# Patient Record
Sex: Male | Born: 1955 | Race: White | Hispanic: No | Marital: Married | Smoking: Former smoker
Health system: Southern US, Community
[De-identification: ages and names within clinical notes are randomized; demographics above are authoritative.]

## PROBLEM LIST (undated history)

## (undated) DIAGNOSIS — F419 Anxiety disorder, unspecified: Secondary | ICD-10-CM

## (undated) DIAGNOSIS — G8929 Other chronic pain: Secondary | ICD-10-CM

## (undated) DIAGNOSIS — K3580 Unspecified acute appendicitis: Secondary | ICD-10-CM

## (undated) DIAGNOSIS — K219 Gastro-esophageal reflux disease without esophagitis: Secondary | ICD-10-CM

## (undated) DIAGNOSIS — J189 Pneumonia, unspecified organism: Secondary | ICD-10-CM

## (undated) DIAGNOSIS — F329 Major depressive disorder, single episode, unspecified: Secondary | ICD-10-CM

## (undated) DIAGNOSIS — E785 Hyperlipidemia, unspecified: Secondary | ICD-10-CM

## (undated) DIAGNOSIS — M1990 Unspecified osteoarthritis, unspecified site: Secondary | ICD-10-CM

## (undated) DIAGNOSIS — I10 Essential (primary) hypertension: Secondary | ICD-10-CM

## (undated) DIAGNOSIS — F32A Depression, unspecified: Secondary | ICD-10-CM

## (undated) HISTORY — DX: Gastro-esophageal reflux disease without esophagitis: K21.9

## (undated) HISTORY — DX: Anxiety disorder, unspecified: F41.9

## (undated) HISTORY — PX: APPENDECTOMY: SHX54

## (undated) HISTORY — DX: Pneumonia, unspecified organism: J18.9

## (undated) HISTORY — DX: Depression, unspecified: F32.A

## (undated) HISTORY — DX: Unspecified osteoarthritis, unspecified site: M19.90

---
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
## 2004-12-05 ENCOUNTER — Encounter: Admission: RE | Admit: 2004-12-05 | Discharge: 2004-12-05 | Payer: Self-pay | Admitting: Family Medicine

## 2007-04-21 ENCOUNTER — Ambulatory Visit: Payer: Self-pay | Admitting: Internal Medicine

## 2007-04-21 DIAGNOSIS — F411 Generalized anxiety disorder: Secondary | ICD-10-CM | POA: Insufficient documentation

## 2007-04-21 DIAGNOSIS — Z8739 Personal history of other diseases of the musculoskeletal system and connective tissue: Secondary | ICD-10-CM | POA: Insufficient documentation

## 2007-07-14 ENCOUNTER — Encounter (INDEPENDENT_AMBULATORY_CARE_PROVIDER_SITE_OTHER): Payer: Self-pay | Admitting: Family Medicine

## 2007-07-14 ENCOUNTER — Ambulatory Visit: Payer: Self-pay | Admitting: Family Medicine

## 2007-07-14 DIAGNOSIS — F17200 Nicotine dependence, unspecified, uncomplicated: Secondary | ICD-10-CM

## 2007-07-15 ENCOUNTER — Telehealth (INDEPENDENT_AMBULATORY_CARE_PROVIDER_SITE_OTHER): Payer: Self-pay | Admitting: *Deleted

## 2007-07-21 ENCOUNTER — Telehealth (INDEPENDENT_AMBULATORY_CARE_PROVIDER_SITE_OTHER): Payer: Self-pay | Admitting: *Deleted

## 2007-07-21 ENCOUNTER — Encounter (INDEPENDENT_AMBULATORY_CARE_PROVIDER_SITE_OTHER): Payer: Self-pay | Admitting: *Deleted

## 2007-07-21 LAB — CONVERTED CEMR LAB
Cholesterol: 238 mg/dL (ref 0–200)
Direct LDL: 162.8 mg/dL
Total CHOL/HDL Ratio: 4.1
Triglycerides: 130 mg/dL (ref 0–149)

## 2008-03-28 ENCOUNTER — Telehealth: Payer: Self-pay | Admitting: Internal Medicine

## 2008-04-03 ENCOUNTER — Ambulatory Visit: Payer: Self-pay | Admitting: Internal Medicine

## 2009-04-29 ENCOUNTER — Telehealth (INDEPENDENT_AMBULATORY_CARE_PROVIDER_SITE_OTHER): Payer: Self-pay | Admitting: *Deleted

## 2010-04-04 ENCOUNTER — Telehealth (INDEPENDENT_AMBULATORY_CARE_PROVIDER_SITE_OTHER): Payer: Self-pay | Admitting: *Deleted

## 2010-04-24 ENCOUNTER — Ambulatory Visit: Payer: Self-pay | Admitting: Internal Medicine

## 2010-04-24 DIAGNOSIS — F1010 Alcohol abuse, uncomplicated: Secondary | ICD-10-CM | POA: Insufficient documentation

## 2010-06-26 ENCOUNTER — Ambulatory Visit: Payer: Self-pay | Admitting: Internal Medicine

## 2010-08-21 ENCOUNTER — Telehealth: Payer: Self-pay | Admitting: Internal Medicine

## 2010-11-09 LAB — CONVERTED CEMR LAB
ALT: 87 units/L — ABNORMAL HIGH (ref 0–53)
AST: 64 units/L — ABNORMAL HIGH (ref 0–37)
Albumin: 4.1 g/dL (ref 3.5–5.2)
Alkaline Phosphatase: 93 units/L (ref 39–117)
BUN: 18 mg/dL (ref 6–23)
Basophils Absolute: 0.1 10*3/uL (ref 0.0–0.1)
CO2: 29 meq/L (ref 19–32)
Cholesterol: 234 mg/dL — ABNORMAL HIGH (ref 0–200)
Direct LDL: 149.4 mg/dL
Eosinophils Relative: 2.6 % (ref 0.0–5.0)
Folate: 13.4 ng/mL
GFR calc non Af Amer: 76.43 mL/min (ref >60)
Glucose, Bld: 101 mg/dL — ABNORMAL HIGH (ref 70–99)
HCT: 48 % (ref 39.0–52.0)
Lymphocytes Relative: 24.3 % (ref 12.0–46.0)
Lymphs Abs: 1.8 10*3/uL (ref 0.7–4.0)
Monocytes Relative: 8.4 % (ref 3.0–12.0)
Neutrophils Relative %: 63.9 % (ref 43.0–77.0)
Platelets: 216 10*3/uL (ref 150.0–400.0)
Potassium: 4.5 meq/L (ref 3.5–5.1)
RDW: 13.3 % (ref 11.5–14.6)
Sodium: 141 meq/L (ref 135–145)
Total CHOL/HDL Ratio: 4
Total Protein: 7 g/dL (ref 6.0–8.3)
Triglycerides: 280 mg/dL — ABNORMAL HIGH (ref 0.0–149.0)
WBC: 7.2 10*3/uL (ref 4.5–10.5)

## 2010-11-13 NOTE — Progress Notes (Signed)
Summary: REFILL REQUEST: ok #20, no more RF w/o OV  Phone Note Refill Request Call back at [phone] Message from:  Pharmacy on April 04, 2010 3:06 PM  Refills Requested: Medication #1:  ALPRAZOLAM 1 MG  TABS 1/2-1 by mouth as needed once a day   Dosage confirmed as above?Dosage Confirmed   Supply Requested: 1 month   Last Refilled: 04/30/2009 [facility]  Next Appointment Scheduled: NONE Initial call taken by: Lavell Islam,  April 04, 2010 3:07 PM  Follow-up for Phone Call        ok  #20, no refills no more refills without office visit. Let patient know Follow-up by: Jose E. Paz MD,  April 04, 2010 4:59 PM    New/Updated Medications: ALPRAZOLAM 1 MG  TABS (ALPRAZOLAM) 1/2-1 by mouth as needed once a day- NO FURTHER REFILLS WITHOUT OFFICE VISIT Prescriptions: ALPRAZOLAM 1 MG  TABS (ALPRAZOLAM) 1/2-1 by mouth as needed once a day- NO FURTHER REFILLS WITHOUT OFFICE VISIT  #20 x 0   Entered by:   Doristine Devoid   Authorized by:   Nolon Rod. Paz MD   Signed by:   Doristine Devoid on 04/08/2010   Method used:   Printed then faxed to ...       [facility]* (retail)       [address] [place], [place]  [address]       Ph: [phone] or [phone]       Fax: [phone]   RxID:   [id number]

## 2010-11-13 NOTE — Assessment & Plan Note (Signed)
Summary: cpx/fasting//kn   Vital Signs:  Patient profile:   55 year old male Height:      73 inches Weight:      219.38 pounds Pulse rate:   94 / minute Pulse rhythm:   regular BP sitting:   128 / 86  (left arm) Cuff size:   large  Vitals Entered By: Army Fossa CMA (June 26, 2010 9:45 AM) CC: CPX,fasting Comments clonazepam- not working declines flu shot pharm- [facility]  not had recent colonoscopy.   History of Present Illness: CPX   as far as anxiety, we switched him from Xanax to clonazepam at the last office visit He takes clonazepam during the daytime and it helps to some extent, he is still unable to sleep so  is also taking a leftover Xanax. Still drinking alcohol although he has cut down he look into counseling, there is free program at work  Preventive Screening-Counseling & Management  Alcohol-Tobacco     Alcohol type: 2-3 times weekly, drinking wine more so than beer.  Caffeine-Diet-Exercise     Does Patient Exercise: yes  Current Medications (verified): 1)  Clonazepam 0.5 Mg Tabs (Clonazepam) .... One By Mouth Twice A Day As Needed For Anxiety 2)  Bayer Low Strength 81 Mg  Tbec (Aspirin) 3)  Xanax 1 Mg Tabs (Alprazolam) .... 1/2-1 Tablet By Mouth As Needed  Allergies: 1)  ! Zoloft  Past History:  Past Medical History: Reviewed history from 04/24/2010 and no changes required. NICOTINE ADDICTION  alcohol abuse ANXIETY  ELEVATED BLOOD PRESSURE WITHOUT DIAGNOSIS OF HYPERTENSION  BACK PAIN, CHRONIC, HX OF  Past Surgical History: no major surgeries   Social History: Married 2kids tobacco-- 1 ppd  ETOH-- 2 or 3 beers and few shots a day  "hace cut down to 1/2 of what i did" works as a Scientist, research (life sciences) at work, doing some exercises at home  diet-- ok most of the time Does Patient Exercise:  yes  Review of Systems General:  Denies fatigue, fever, and weight loss. CV:  Denies chest pain or discomfort, palpitations, and  swelling of feet. Resp:  Denies cough, coughing up blood, and shortness of breath. GI:  Denies bloody stools, diarrhea, nausea, and vomiting. GU:  Denies dysuria, hematuria, urinary frequency, and urinary hesitancy. MS:  occasionally hand numbness .  Physical Exam  General:  alert and well-developed.   Neck:  no masses, no thyromegaly, and normal carotid upstroke.   Lungs:  normal respiratory effort, no intercostal retractions, no accessory muscle use, and normal breath sounds.   Heart:  normal rate, regular rhythm, and no murmur.   Abdomen:  soft, non-tender, no distention, no masses, no guarding, and no rigidity.  no bruit Rectal:  No external abnormalities noted. Normal sphincter tone. No rectal masses or tenderness. Brown stools, Hemoccult negative Prostate:  Prostate gland firm and smooth, no enlargement, nodularity, tenderness, mass, asymmetry or induration. Extremities:  normal extremity edema Neurologic:  alert & oriented X3, strength normal in all extremities, and gait normal.   Psych:  Oriented X3, memory intact for recent and remote, not anxious appearing, and not depressed appearing.     Impression & Recommendations:  Problem # 1:  PREVENTIVE HEALTH CARE (ICD-V70.0) Td 03 never had a flu shot explained benefits   EKG--sinus rythm, no changes  never had a Cscope Colonoscopy Vs.iFOB cards reviewed w/ pt. Provided  iFOB but he will  call if he decides to have a  colonoscopy   diet-exercise discussed  need  to stop alcohol again discussed Recommend to see a dentist in years particularly with his history of heavy tobacco use  Problem # 2:  ANXIETY (ICD-300.00) unfortunately still using Xanax, recommend to stop it completely Continue with clonazepam twice a day and use Tylenol PM for insomnia. Again strongly encouraged to seek counseling His updated medication list for this problem includes:    Clonazepam 0.5 Mg Tabs (Clonazepam) ..... One by mouth twice a day as needed  for anxiety  Problem # 3:  ALCOHOL ABUSE (ICD-305.00)  praised  for "cutting down" but his goal is complete abstinence  Orders: TLB-B12 + Folate Pnl (95284_13244-W10/UVO)  Complete Medication List: 1)  Clonazepam 0.5 Mg Tabs (Clonazepam) .... One by mouth twice a day as needed for anxiety 2)  Bayer Low Strength 81 Mg Tbec (Aspirin)  Other Orders: Venipuncture (53664) TLB-Lipid Panel (80061-LIPID) TLB-PSA (Prostate Specific Antigen) (84153-PSA) TLB-TSH (Thyroid Stimulating Hormone) (84443-TSH) TLB-BMP (Basic Metabolic Panel-BMET) (80048-METABOL) TLB-Hepatic/Liver Function Pnl (80076-HEPATIC) TLB-CBC Platelet - w/Differential (85025-CBCD) EKG w/ Interpretation (93000) Specimen Handling (40347)  Patient Instructions: 1)  Please schedule a follow-up appointment in 4 months .  Prescriptions: CLONAZEPAM 0.5 MG TABS (CLONAZEPAM) one by mouth twice a day as needed for anxiety  #40 x 0   Entered and Authorized by:   Nolon Rod. Paz MD   Signed by:   Nolon Rod. Paz MD on 06/26/2010   Method used:   Print then Give to Patient   RxID:   [id number]    Risk Factors:     Type:  2-3 times weekly, drinking wine more so than beer. Exercise:  yes

## 2010-11-13 NOTE — Progress Notes (Signed)
Summary: alprazolam refill  Phone Note Refill Request Message from:  Fax from Pharmacy on August 21, 2010 2:19 PM  Refills Requested: Medication #1:  ALPRAZOLAM 1MG  TAB   Last Refilled: 04/08/2010   Notes: TAKE 1/2 TO 1 TABLET BY MOUTH AS NEEDED ONCE DAILY [facility],  [place], [place]   fax  [phone]    ****NOTE***  this is listed in discontinued med list because I think it was changed to another medication  Initial call taken by: Jerolyn Shin,  August 21, 2010 2:23 PM  Follow-up for Phone Call        Pt states that he feels the Xanax works better for him. Okay to switch?  Follow-up by: Army Fossa CMA,  August 21, 2010 2:30 PM  Additional Follow-up for Phone Call Additional follow up Details #1::        ok go back to xanax #30, no RF Additional Follow-up by: [facility] E. Paz MD,  August 21, 2010 5:14 PM    New/Updated Medications: ALPRAZOLAM 1 MG TABS (ALPRAZOLAM) 1/2-1 by mouth as needed once daily. Prescriptions: ALPRAZOLAM 1 MG TABS (ALPRAZOLAM) 1/2-1 by mouth as needed once daily.  #30 x 0   Entered by:   Army Fossa CMA   Authorized by:   Nolon Rod. Paz MD   Signed by:   Army Fossa CMA on 08/22/2010   Method used:   Printed then faxed to ...       [facility]* (retail)       [address] [place], [place]  [address]       Ph: [phone] or [phone]       Fax: [phone]   RxID:   [id number]

## 2010-11-13 NOTE — Assessment & Plan Note (Signed)
Summary: med refill/cbs   Vital Signs:  Patient profile:   55 year old male Height:      72.25 inches Weight:      222.38 pounds BMI:     30.06 Pulse rate:   80 / minute Pulse rhythm:   regular BP sitting:   136 / 86  (left arm) Cuff size:   large  Vitals Entered By: Army Fossa CMA (April 24, 2010 11:07 AM) CC: Pt here to f/u on Meds. Fasting   History of Present Illness: here for Xanax refill He states that he uses Xanax p.r.n., wonders if there is a better medication for anxiety. Specifically wonders about clonazepam  Allergies: 1)  ! Zoloft  Past History:  Past Medical History: NICOTINE ADDICTION  alcohol abuse ANXIETY  ELEVATED BLOOD PRESSURE WITHOUT DIAGNOSIS OF HYPERTENSION  BACK PAIN, CHRONIC, HX OF  Social History: Married 2kids tobacco-- 1 ppd  ETOH-- 2 or 3 beers and few shots a day works as a Merchandiser, retail  Review of Systems       when asked about depression ( sad, withrawn, blue) he is vague. "My wife thinks i'm  depressed, I'm not sure"  Physical Exam  General:  alert and well-developed.   Lungs:  normal respiratory effort, no intercostal retractions, no accessory muscle use, and normal breath sounds.   Heart:  normal rate, regular rhythm, and no murmur.   Extremities:  no edema Psych:  Oriented X3, memory intact for recent and remote, not anxious appearing, and not depressed appearing.     Impression & Recommendations:  Problem # 1:  ANXIETY (ICD-300.00) the patient has episodic anxiety for more than 20 years ( per patient) and takes Xanax as needed. We have prescribed this in the past very few times. I think he has a bigger problem than just anxiety: throughout the visit, he made several comments about his wife that make me think they have a poor relationship. He also is drinking excessively, see social history plan: trial with clonazepam Strongly encouraged to look into AA or other  resources for alcohol abuse to call if needs a  referal Marriage counseling  (  he has actually think about it before) Come back in 2 months fasting for physical His updated medication list for this problem includes:    Clonazepam 0.5 Mg Tabs (Clonazepam) ..... One by mouth twice a day as needed for anxiety  Problem # 2:  ALCOHOL ABUSE (ICD-305.00) see #1   Complete Medication List: 1)  Clonazepam 0.5 Mg Tabs (Clonazepam) .... One by mouth twice a day as needed for anxiety 2)  Bayer Low Strength 81 Mg Tbec (Aspirin)  Patient Instructions: 1)   Please schedule a follow-up appointment in 2 months,fasting, physical exam Prescriptions: CLONAZEPAM 0.5 MG TABS (CLONAZEPAM) one by mouth twice a day as needed for anxiety  #40 x 0   Entered and Authorized by:   Elita Quick E. Joanna Borawski MD   Signed by:   Nolon Rod. Cyntha Brickman MD on 04/24/2010   Method used:   Print then Give to Patient   RxID:   [id number]

## 2010-12-19 ENCOUNTER — Telehealth: Payer: Self-pay | Admitting: Internal Medicine

## 2010-12-23 NOTE — Progress Notes (Signed)
Summary: refill  Phone Note Refill Request Message from:  Fax from Pharmacy on December 19, 2010 3:57 PM  Refills Requested: Medication #1:  ALPRAZOLAM 1 MG TABS 1/2-1 by mouth as needed once daily. [facility] - fax [phone]  Initial call taken by: Okey Regal Spring,  December 19, 2010 3:58 PM  Follow-up for Phone Call        30, no RF Dilpreet Faires E. Emily Massar MD  December 19, 2010 4:35 PM     Prescriptions: ALPRAZOLAM 1 MG TABS (ALPRAZOLAM) 1/2-1 by mouth as needed once daily.  #30 x 0   Entered by:   Army Fossa CMA   Authorized by:   Nolon Rod. Blanca Carreon MD   Signed by:   Army Fossa CMA on 12/19/2010   Method used:   Printed then faxed to ...       [facility]* (retail)       [address] [place], [place]  [address]       Ph: [phone] or [phone]       Fax: [phone]   RxID:   [id number]

## 2011-03-17 ENCOUNTER — Ambulatory Visit (INDEPENDENT_AMBULATORY_CARE_PROVIDER_SITE_OTHER): Payer: BC Managed Care – PPO | Admitting: Internal Medicine

## 2011-03-17 ENCOUNTER — Encounter: Payer: Self-pay | Admitting: Internal Medicine

## 2011-03-17 DIAGNOSIS — W57XXXA Bitten or stung by nonvenomous insect and other nonvenomous arthropods, initial encounter: Secondary | ICD-10-CM | POA: Insufficient documentation

## 2011-03-17 MED ORDER — DOXYCYCLINE HYCLATE 100 MG PO TABS: 100.0000 mg | ORAL_TABLET | Freq: Two times a day (BID) | ORAL | Status: AC

## 2011-03-17 NOTE — Assessment & Plan Note (Addendum)
Patient had a tick bite and now has a rash. The rash is not classic at all for Lyme disease, the patient however is quite concerned about this illness. I agreed to prescribe doxycycline for one week, he understands that this is likely a  over kill but does not like to " take any chances". Warned about the phototoxicity with doxycycline. He will take precautions.  Today , I spent more than 15  min with the patient, >50% of the time counseling

## 2011-03-17 NOTE — Progress Notes (Signed)
  Subjective:    Patient ID: Connor Haas, male    DOB: October 10, 1956, 55 y.o.   MRN: [id number]  HPI Removed at deer tick from the left leg 3 days ago, developed a rash. Patient is quite concerned about Lyme disease  No past medical history on file.  No past surgical history on file.  Review of Systems No fever, fatigue, nausea or vomiting. No other  rash    Objective:   Physical Exam Alert, oriented, no apparent distress. Lower extremities without edema. On day left calf has a 1 cm round slightly elevated skin lesion.       Assessment & Plan:

## 2011-05-22 ENCOUNTER — Other Ambulatory Visit: Payer: Self-pay | Admitting: Internal Medicine

## 2011-05-22 NOTE — Telephone Encounter (Signed)
Alprazolam request/EMR shows that medication was changed on 04/24/2010 to Clonazepam 0.5 mg [1]tab BID Please advise.

## 2011-05-25 MED ORDER — ALPRAZOLAM 1 MG PO TABS: ORAL_TABLET | ORAL | Status: DC

## 2011-05-25 MED ORDER — ALPRAZOLAM 1 MG PO TABS: 1.0000 mg | ORAL_TABLET | Freq: Every evening | ORAL | Status: DC | PRN

## 2011-05-25 NOTE — Telephone Encounter (Signed)
Spoke w/ pt says he is taking alprazolam informed rx to be sent to pharmacy

## 2011-05-25 NOTE — Telephone Encounter (Signed)
Left msg on voicemail for pt to return call . 

## 2011-05-25 NOTE — Telephone Encounter (Signed)
Please call patient, which one is he taking?. Whatever he is taking, okay to call #40, no refills

## 2011-06-23 ENCOUNTER — Other Ambulatory Visit: Payer: Self-pay | Admitting: Internal Medicine

## 2011-06-24 NOTE — Telephone Encounter (Signed)
Last OV 03/17/11 for tick bite. Last filled 05/25/11 #45

## 2011-06-24 NOTE — Telephone Encounter (Signed)
Denied, needs ov  before any refills

## 2011-06-25 NOTE — Telephone Encounter (Signed)
Pharmacy advised of denied RX for xanax. Pt advised that he needs an OV before refills will be given. He states that he is going on vacation next week and will schedule OV when he returns

## 2012-12-28 ENCOUNTER — Emergency Department (HOSPITAL_COMMUNITY): Payer: BC Managed Care – PPO

## 2012-12-28 ENCOUNTER — Observation Stay (HOSPITAL_COMMUNITY)
Admission: EM | Admit: 2012-12-28 | Discharge: 2012-12-29 | Disposition: A | Discharge status: Home / Self Care | Payer: BC Managed Care – PPO | Attending: Internal Medicine | Admitting: Internal Medicine

## 2012-12-28 ENCOUNTER — Observation Stay (HOSPITAL_COMMUNITY)
Admit: 2012-12-28 | Discharge: 2012-12-28 | Disposition: A | Discharge status: Home / Self Care | Payer: BC Managed Care – PPO | Attending: Internal Medicine | Admitting: Internal Medicine

## 2012-12-28 ENCOUNTER — Encounter (HOSPITAL_COMMUNITY): Payer: Self-pay | Admitting: Emergency Medicine

## 2012-12-28 DIAGNOSIS — S0180XA Unspecified open wound of other part of head, initial encounter: Secondary | ICD-10-CM | POA: Insufficient documentation

## 2012-12-28 DIAGNOSIS — R079 Chest pain, unspecified: Secondary | ICD-10-CM | POA: Insufficient documentation

## 2012-12-28 DIAGNOSIS — R42 Dizziness and giddiness: Secondary | ICD-10-CM | POA: Insufficient documentation

## 2012-12-28 DIAGNOSIS — N39498 Other specified urinary incontinence: Secondary | ICD-10-CM | POA: Insufficient documentation

## 2012-12-28 DIAGNOSIS — F17200 Nicotine dependence, unspecified, uncomplicated: Secondary | ICD-10-CM | POA: Insufficient documentation

## 2012-12-28 DIAGNOSIS — S0181XA Laceration without foreign body of other part of head, initial encounter: Secondary | ICD-10-CM

## 2012-12-28 DIAGNOSIS — Y92009 Unspecified place in unspecified non-institutional (private) residence as the place of occurrence of the external cause: Secondary | ICD-10-CM | POA: Insufficient documentation

## 2012-12-28 DIAGNOSIS — I951 Orthostatic hypotension: Principal | ICD-10-CM | POA: Insufficient documentation

## 2012-12-28 DIAGNOSIS — I517 Cardiomegaly: Secondary | ICD-10-CM

## 2012-12-28 DIAGNOSIS — F1010 Alcohol abuse, uncomplicated: Secondary | ICD-10-CM | POA: Insufficient documentation

## 2012-12-28 DIAGNOSIS — F411 Generalized anxiety disorder: Secondary | ICD-10-CM | POA: Insufficient documentation

## 2012-12-28 DIAGNOSIS — R296 Repeated falls: Secondary | ICD-10-CM | POA: Insufficient documentation

## 2012-12-28 DIAGNOSIS — R55 Syncope and collapse: Secondary | ICD-10-CM | POA: Diagnosis present

## 2012-12-28 DIAGNOSIS — Z79899 Other long term (current) drug therapy: Secondary | ICD-10-CM | POA: Insufficient documentation

## 2012-12-28 LAB — CBC WITH DIFFERENTIAL/PLATELET
Basophils Absolute: 0 10*3/uL (ref 0.0–0.1)
Basophils Relative: 0 % (ref 0–1)
HCT: 49.4 % (ref 39.0–52.0)
Hemoglobin: 17.4 g/dL — ABNORMAL HIGH (ref 13.0–17.0)
Lymphocytes Relative: 6 % — ABNORMAL LOW (ref 12–46)
MCHC: 35.2 g/dL (ref 30.0–36.0)
Monocytes Absolute: 0.6 10*3/uL (ref 0.1–1.0)
Monocytes Relative: 5 % (ref 3–12)
Neutro Abs: 9.7 10*3/uL — ABNORMAL HIGH (ref 1.7–7.7)
Neutrophils Relative %: 88 % — ABNORMAL HIGH (ref 43–77)
RBC: 5.54 MIL/uL (ref 4.22–5.81)
WBC: 11 10*3/uL — ABNORMAL HIGH (ref 4.0–10.5)

## 2012-12-28 LAB — TROPONIN I
Troponin I: <0.3 ng/mL (ref <0.30)
Troponin I: <0.3 ng/mL (ref <0.30)
Troponin I: <0.3 ng/mL (ref <0.30)

## 2012-12-28 LAB — POCT I-STAT, CHEM 8
BUN: 19 mg/dL (ref 6–23)
Creatinine, Ser: 1 mg/dL (ref 0.50–1.35)
Glucose, Bld: 129 mg/dL — ABNORMAL HIGH (ref 70–99)
Potassium: 3.8 mEq/L (ref 3.5–5.1)
Sodium: 140 mEq/L (ref 135–145)

## 2012-12-28 LAB — COMPREHENSIVE METABOLIC PANEL
AST: 34 U/L (ref 0–37)
Albumin: 3.3 g/dL — ABNORMAL LOW (ref 3.5–5.2)
Alkaline Phosphatase: 107 U/L (ref 39–117)
BUN: 17 mg/dL (ref 6–23)
CO2: 22 mEq/L (ref 19–32)
Chloride: 98 mEq/L (ref 96–112)
GFR calc non Af Amer: >90 mL/min (ref >90)
Potassium: 3.8 mEq/L (ref 3.5–5.1)
Total Bilirubin: 0.2 mg/dL — ABNORMAL LOW (ref 0.3–1.2)

## 2012-12-28 LAB — D-DIMER, QUANTITATIVE: D-Dimer, Quant: 0.36 ug/mL-FEU (ref 0.00–0.48)

## 2012-12-28 LAB — POCT I-STAT TROPONIN I

## 2012-12-28 MED ORDER — NICOTINE 14 MG/24HR TD PT24
14.0000 mg | MEDICATED_PATCH | Freq: Every day | TRANSDERMAL | Status: DC
  Administered 2012-12-28 – 2012-12-29 (×2): 14 mg via TRANSDERMAL
  Filled 2012-12-28 (×2): qty 1

## 2012-12-28 MED ORDER — ACETAMINOPHEN 325 MG PO TABS
650.0000 mg | ORAL_TABLET | Freq: Four times a day (QID) | ORAL | Status: DC | PRN
  Administered 2012-12-29: 650 mg via ORAL
  Filled 2012-12-28: qty 2

## 2012-12-28 MED ORDER — ACETAMINOPHEN 650 MG RE SUPP: 650.0000 mg | Freq: Four times a day (QID) | RECTAL | Status: DC | PRN

## 2012-12-28 MED ORDER — SODIUM CHLORIDE 0.9 % IV SOLN
INTRAVENOUS | Status: AC
  Administered 2012-12-28 (×2): via INTRAVENOUS

## 2012-12-28 MED ORDER — SODIUM CHLORIDE 0.9 % IV SOLN
Freq: Once | INTRAVENOUS | Status: AC
  Administered 2012-12-28: 03:00:00 via INTRAVENOUS

## 2012-12-28 MED ORDER — VITAMIN B-1 100 MG PO TABS
100.0000 mg | ORAL_TABLET | Freq: Every day | ORAL | Status: DC
  Administered 2012-12-28 – 2012-12-29 (×2): 100 mg via ORAL
  Filled 2012-12-28 (×2): qty 1

## 2012-12-28 MED ORDER — ONDANSETRON HCL 4 MG/2ML IJ SOLN: 4.0000 mg | Freq: Four times a day (QID) | INTRAMUSCULAR | Status: DC | PRN

## 2012-12-28 MED ORDER — ONDANSETRON HCL 4 MG PO TABS: 4.0000 mg | ORAL_TABLET | Freq: Four times a day (QID) | ORAL | Status: DC | PRN

## 2012-12-28 MED ORDER — SODIUM CHLORIDE 0.9 % IJ SOLN: 3.0000 mL | Freq: Two times a day (BID) | INTRAMUSCULAR | Status: DC

## 2012-12-28 MED ORDER — ENOXAPARIN SODIUM 40 MG/0.4ML ~~LOC~~ SOLN
40.0000 mg | SUBCUTANEOUS | Status: DC
  Administered 2012-12-28: 40 mg via SUBCUTANEOUS
  Filled 2012-12-28 (×2): qty 0.4

## 2012-12-28 MED ORDER — TETANUS-DIPHTH-ACELL PERTUSSIS 5-2.5-18.5 LF-MCG/0.5 IM SUSP
0.5000 mL | Freq: Once | INTRAMUSCULAR | Status: AC
  Administered 2012-12-28: 0.5 mL via INTRAMUSCULAR
  Filled 2012-12-28: qty 0.5

## 2012-12-28 MED ORDER — THIAMINE HCL 100 MG/ML IJ SOLN
100.0000 mg | Freq: Every day | INTRAMUSCULAR | Status: DC
  Filled 2012-12-28 (×2): qty 1

## 2012-12-28 MED ORDER — LORAZEPAM 1 MG PO TABS: 0.0000 mg | ORAL_TABLET | Freq: Two times a day (BID) | ORAL | Status: DC

## 2012-12-28 MED ORDER — ADULT MULTIVITAMIN W/MINERALS CH
1.0000 | ORAL_TABLET | Freq: Every day | ORAL | Status: DC
  Administered 2012-12-28 – 2012-12-29 (×2): 1 via ORAL
  Filled 2012-12-28 (×2): qty 1

## 2012-12-28 MED ORDER — FOLIC ACID 1 MG PO TABS
1.0000 mg | ORAL_TABLET | Freq: Every day | ORAL | Status: DC
  Administered 2012-12-28 – 2012-12-29 (×2): 1 mg via ORAL
  Filled 2012-12-28 (×2): qty 1

## 2012-12-28 MED ORDER — LORAZEPAM 2 MG/ML IJ SOLN: 1.0000 mg | Freq: Four times a day (QID) | INTRAMUSCULAR | Status: DC | PRN

## 2012-12-28 MED ORDER — ASPIRIN EC 81 MG PO TBEC
81.0000 mg | DELAYED_RELEASE_TABLET | Freq: Every day | ORAL | Status: DC
  Administered 2012-12-28 – 2012-12-29 (×2): 81 mg via ORAL
  Filled 2012-12-28 (×2): qty 1

## 2012-12-28 MED ORDER — PNEUMOCOCCAL VAC POLYVALENT 25 MCG/0.5ML IJ INJ
0.5000 mL | INJECTION | INTRAMUSCULAR | Status: AC
  Administered 2012-12-28: 0.5 mL via INTRAMUSCULAR
  Filled 2012-12-28: qty 0.5

## 2012-12-28 MED ORDER — LORAZEPAM 1 MG PO TABS
1.0000 mg | ORAL_TABLET | Freq: Four times a day (QID) | ORAL | Status: DC | PRN
  Administered 2012-12-28 (×2): 1 mg via ORAL
  Filled 2012-12-28 (×2): qty 1

## 2012-12-28 MED ORDER — PANTOPRAZOLE SODIUM 40 MG PO TBEC
40.0000 mg | DELAYED_RELEASE_TABLET | Freq: Every day | ORAL | Status: DC
  Administered 2012-12-28 – 2012-12-29 (×2): 40 mg via ORAL
  Filled 2012-12-28: qty 1

## 2012-12-28 MED ORDER — LORAZEPAM 1 MG PO TABS: 0.0000 mg | ORAL_TABLET | Freq: Four times a day (QID) | ORAL | Status: DC

## 2012-12-28 NOTE — ED Notes (Signed)
Brought in by EMS from home after his "near syncopal episode" at around 12:50AM.  Per EMS, pt was on his way to the refrigerator when he felt "dizzy.Marland Kitchen Feeling like I'm going down and disoriented so I sat down".  Pt arrived to ED room A/Ox4, denies pain, no s/s distress noted--- pt reports "drinking tonight".

## 2012-12-28 NOTE — ED Provider Notes (Signed)
History     CSN: [id number]  Arrival date & time 12/28/12  0133   First MD Initiated Contact with Patient 12/28/12 0149      Chief Complaint  Patient presents with  . Near Syncope    (Consider location/radiation/quality/duration/timing/severity/associated sxs/prior treatment) HPI Comments: Pt presents with spouse by EMS after he had an unwitnessed syncopal event at home.  He states that he drinks heavily every night, approximately 1 pint of rum and a couple of beers which is normal for him. He has been having some increased heartburn" this evening and as he walked to the kitchen to obtain some medication for this he became very lightheaded and very quickly passed out. He states that he struck his head as he fell to the floor and has some facial pain on the right. He does not have a headache, does not have blurred vision and denies nausea or vomiting. His wife found him sitting on the floor, he passed out in front of her last a few seconds and then came back 2. During these episodes he lost urine incontinence, was found to be hypotensive by EMS he measured a blood pressure of 60 systolic and transported. He has a laceration to his chin.  The syncopal event was fairly acute in onset, occurred just prior to arrival and resolve spontaneously.  The history is provided by the patient, the spouse and the EMS personnel.    History reviewed. No pertinent past medical history.  History reviewed. No pertinent past surgical history.  History reviewed. No pertinent family history.  History  Substance Use Topics  . Smoking status: Current Every Day Smoker -- 1.00 packs/day  . Smokeless tobacco: Not on file  . Alcohol Use: Yes     Comment: 3-week      Review of Systems  All other systems reviewed and are negative.    Allergies  Sertraline hcl  Home Medications   Current Outpatient Rx  Name  Route  Sig  Dispense  Refill  . ALPRAZolam (XANAX) 1 MG tablet      1/2-1 tablet daily as  needed   40 tablet   0     BP 147/86  Pulse 93  Temp(Src) 98.8 F (37.1 C) (Oral)  Resp 18  Ht 6\' 1"  (1.854 m)  Wt 220 lb (99.791 kg)  BMI 29.03 kg/m2  SpO2 94%  Physical Exam  Nursing note and vitals reviewed. Constitutional: He appears well-developed and well-nourished. No distress.  HENT:  Head: Normocephalic and atraumatic.  Mouth/Throat: Oropharynx is clear and moist. No oropharyngeal exudate.  Dentition intact, no malocclusion, no battle sign or raccoon eyes, laceration to the chin  Eyes: Conjunctivae and EOM are normal. Pupils are equal, round, and reactive to light. Right eye exhibits no discharge. Left eye exhibits no discharge. No scleral icterus.  Neck: Normal range of motion. Neck supple. No JVD present. No thyromegaly present.  Cardiovascular: Regular rhythm, normal heart sounds and intact distal pulses.  Exam reveals no gallop and no friction rub.   No murmur heard. Tachycardia to 105  Pulmonary/Chest: Effort normal and breath sounds normal. No respiratory distress. He has no wheezes. He has no rales.  Abdominal: Soft. Bowel sounds are normal. He exhibits no distension and no mass. There is no tenderness.  No tenderness over the abdomen, overweight  Musculoskeletal: Normal range of motion. He exhibits no edema and no tenderness.  Moves all 4 extremities without any difficulty, no obvious deformities or tenderness when palpated and placed through  range of motion  Lymphadenopathy:    He has no cervical adenopathy.  Neurological: He is alert. Coordination normal.  Follows commands, alert and oriented, cranial nerves III through XII are intact, muscular strength is normal in all 4 extremities, no limb ataxia.  Skin: Skin is warm and dry. No rash noted. No erythema.  Laceration as described on the head exam  Psychiatric: He has a normal mood and affect. His behavior is normal.    ED Course  Procedures (including critical care time)  Labs Reviewed  CBC WITH  DIFFERENTIAL - Abnormal; Notable for the following:    WBC 11.0 (*)    Hemoglobin 17.4 (*)    Neutrophils Relative 88 (*)    Neutro Abs 9.7 (*)    Lymphocytes Relative 6 (*)    Lymphs Abs 0.6 (*)    All other components within normal limits  COMPREHENSIVE METABOLIC PANEL - Abnormal; Notable for the following:    Glucose, Bld 135 (*)    Albumin 3.3 (*)    Total Bilirubin 0.2 (*)    All other components within normal limits  PROTIME-INR - Abnormal; Notable for the following:    Prothrombin Time 11.5 (*)    All other components within normal limits  ETHANOL - Abnormal; Notable for the following:    Alcohol, Ethyl (B) 35 (*)    All other components within normal limits  POCT I-STAT, CHEM 8 - Abnormal; Notable for the following:    Glucose, Bld 129 (*)    Calcium, Ion 1.04 (*)    Hemoglobin 17.7 (*)    All other components within normal limits  APTT  POCT I-STAT TROPONIN I   Ct Head Wo Contrast  12/28/2012  *RADIOLOGY REPORT*  Clinical Data:  The patient passed out and has a scrape on the left cheek and neck pain.  CT HEAD WITHOUT CONTRAST CT MAXILLOFACIAL WITHOUT CONTRAST CT CERVICAL SPINE WITHOUT CONTRAST  Technique:  Multidetector CT imaging of the head, cervical spine, and maxillofacial structures were performed using the standard protocol without intravenous contrast. Multiplanar CT image reconstructions of the cervical spine and maxillofacial structures were also generated.  Comparison:   None  CT HEAD  Findings: The ventricles and sulci are symmetrical without significant effacement, displacement, or dilatation. No mass effect or midline shift. No abnormal extra-axial fluid collections. The grey-white matter junction is distinct. Basal cisterns are not effaced. No acute intracranial hemorrhage. No depressed skull fractures.  Mastoid air cells are not opacified.  IMPRESSION: No acute intracranial abnormalities.  CT MAXILLOFACIAL  Findings:  The globes and extraocular muscles appear  intact and symmetrical.  There are retention cysts in the maxillary antra bilaterally.  No acute air-fluid levels in the paranasal sinuses. The nasal bones demonstrate slight irregularity suggesting possible mild fracture deformity of indeterminate age.  The orbital rims, maxillary antral walls, nasal septum, nasal spine, maxilla, pterygoid plates, zygomatic arches, temporomandibular joints, and mandibles appear intact.  No displaced fractures are identified.  IMPRESSION: Mild nasal bone deformities suggesting possibility of nasal fractures of indeterminate age.  No displaced orbital or facial fractures are demonstrated.  Retention cysts in the paranasal sinuses.  CT CERVICAL SPINE  Findings:   Normal alignment of the cervical vertebrae and facet joints.  Lateral masses of C1 appear symmetrical.  The odontoid process appears intact.  Degenerative changes with narrowed disc spaces and endplate hypertrophic changes at C3-4 and C5-6 levels. 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  No vertebral compression deformities.  No prevertebral soft tissue swelling.  No focal bone lesion or bone destruction.  The bone cortex and trabecular architecture appear intact.  No paraspinal soft tissue infiltration.  IMPRESSION: No displaced cervical spine fractures identified.  Degenerative changes.  Congenital coalition of C7-T1.   Original Report Authenticated By: Burman Nieves, M.D.    Ct Cervical Spine Wo Contrast  12/28/2012  *RADIOLOGY REPORT*  Clinical Data:  The patient passed out and has a scrape on the left cheek and neck pain.  CT HEAD WITHOUT CONTRAST CT MAXILLOFACIAL WITHOUT CONTRAST CT CERVICAL SPINE WITHOUT CONTRAST  Technique:  Multidetector CT imaging of the head, cervical spine, and maxillofacial structures were performed using the standard protocol without intravenous contrast. Multiplanar CT image reconstructions of the cervical spine and maxillofacial structures were also  generated.  Comparison:   None  CT HEAD  Findings: The ventricles and sulci are symmetrical without significant effacement, displacement, or dilatation. No mass effect or midline shift. No abnormal extra-axial fluid collections. The grey-white matter junction is distinct. Basal cisterns are not effaced. No acute intracranial hemorrhage. No depressed skull fractures.  Mastoid air cells are not opacified.  IMPRESSION: No acute intracranial abnormalities.  CT MAXILLOFACIAL  Findings:  The globes and extraocular muscles appear intact and symmetrical.  There are retention cysts in the maxillary antra bilaterally.  No acute air-fluid levels in the paranasal sinuses. The nasal bones demonstrate slight irregularity suggesting possible mild fracture deformity of indeterminate age.  The orbital rims, maxillary antral walls, nasal septum, nasal spine, maxilla, pterygoid plates, zygomatic arches, temporomandibular joints, and mandibles appear intact.  No displaced fractures are identified.  IMPRESSION: Mild nasal bone deformities suggesting possibility of nasal fractures of indeterminate age.  No displaced orbital or facial fractures are demonstrated.  Retention cysts in the paranasal sinuses.  CT CERVICAL SPINE  Findings:   Normal alignment of the cervical vertebrae and facet joints.  Lateral masses of C1 appear symmetrical.  The odontoid process appears intact.  Degenerative changes with narrowed disc spaces and endplate hypertrophic changes at C3-4 and C5-6 levels. 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  No vertebral compression deformities.  No prevertebral soft tissue swelling.  No focal bone lesion or bone destruction.  The bone cortex and trabecular architecture appear intact.  No paraspinal soft tissue infiltration.  IMPRESSION: No displaced cervical spine fractures identified.  Degenerative changes.  Congenital coalition of C7-T1.   Original Report Authenticated By: Burman Nieves, M.D.    Dg Chest Port 1 View  12/28/2012  *RADIOLOGY REPORT*  Clinical Data: Syncope.  Chest pain.  PORTABLE CHEST - 1 VIEW  Comparison: None.  Findings: Mild hyperinflation.  Scattered calcified granulomas in the lungs.  Normal heart size and pulmonary vascularity.  No focal consolidation or airspace disease in the lungs.  No blunting of costophrenic angles.  No pneumothorax.  Mediastinal contours appear intact.  IMPRESSION: No evidence of active pulmonary disease.   Original Report Authenticated By: Burman Nieves, M.D.    Ct Maxillofacial Wo Cm  12/28/2012  *RADIOLOGY REPORT*  Clinical Data:  The patient passed out and has a scrape on the left cheek and neck pain.  CT HEAD WITHOUT CONTRAST CT MAXILLOFACIAL WITHOUT CONTRAST CT CERVICAL SPINE WITHOUT CONTRAST  Technique:  Multidetector CT imaging of the head, cervical spine, and maxillofacial structures were performed using the standard protocol without intravenous contrast. Multiplanar CT image reconstructions of the cervical spine and maxillofacial structures were also generated.  Comparison:   None  CT HEAD  Findings: The ventricles and sulci are symmetrical without significant effacement, displacement, or dilatation. No mass effect or midline shift. No abnormal extra-axial fluid collections. The grey-white matter junction is distinct. Basal cisterns are not effaced. No acute intracranial hemorrhage. No depressed skull fractures.  Mastoid air cells are not opacified.  IMPRESSION: No acute intracranial abnormalities.  CT MAXILLOFACIAL  Findings:  The globes and extraocular muscles appear intact and symmetrical.  There are retention cysts in the maxillary antra bilaterally.  No acute air-fluid levels in the paranasal sinuses. The nasal bones demonstrate slight irregularity suggesting possible mild fracture deformity of indeterminate age.  The orbital rims, maxillary antral walls, nasal septum, nasal spine, maxilla, pterygoid plates, zygomatic  arches, temporomandibular joints, and mandibles appear intact.  No displaced fractures are identified.  IMPRESSION: Mild nasal bone deformities suggesting possibility of nasal fractures of indeterminate age.  No displaced orbital or facial fractures are demonstrated.  Retention cysts in the paranasal sinuses.  CT CERVICAL SPINE  Findings:   Normal alignment of the cervical vertebrae and facet joints.  Lateral masses of C1 appear symmetrical.  The odontoid process appears intact.  Degenerative changes with narrowed disc spaces and endplate hypertrophic changes at C3-4 and C5-6 levels. 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  No vertebral compression deformities.  No prevertebral soft tissue swelling.  No focal bone lesion or bone destruction.  The bone cortex and trabecular architecture appear intact.  No paraspinal soft tissue infiltration.  IMPRESSION: No displaced cervical spine fractures identified.  Degenerative changes.  Congenital coalition of C7-T1.   Original Report Authenticated By: Burman Nieves, M.D.      1. Syncope   2. Laceration of chin, initial encounter       MDM  The patient will need imaging of his head spine and face, EKG shows inverted T waves in the lateral leads 1 and aVL, no signs of ST elevation. We'll obtain laboratory workup to rule out cardiac etiology as well as other etiologies. The patient does drink heavy amounts of alcohol, has no hepatosplenomegaly and no abdominal tenderness. No signs of seizure activity per the spouse.  ED ECG REPORT  I personally interpreted this EKG   Date: 12/28/2012   Rate: 102  Rhythm: sinus tachycardia  QRS Axis: normal  Intervals: normal  ST/T Wave abnormalities: T-wave abnormalities in leads 1 and aVL  Conduction Disutrbances:none  Narrative Interpretation:   Old EKG Reviewed: no old ECG to compare  LACERATION REPAIR Performed by: Vida Roller Authorized by: Vida Roller Consent:  Verbal consent obtained. Risks and benefits: risks, benefits and alternatives were discussed Consent given by: patient Patient identity confirmed: provided demographic data Prepped and Draped in normal sterile fashion Wound explored  Laceration Location: Chin  Laceration Length: 3cm  No Foreign Bodies seen or palpated  Anesthesia: local infiltration  Local anesthetic: lidocaine 1 % without epinephrine  Anesthetic total: 3 ml  Irrigation method: syringe Amount of cleaning: standard  Skin closure: 5-0 prolene  Number of sutures: 4  Technique: Simple Interrupted  Patient tolerance: Patient tolerated the procedure well with no immediate complications.   Discussed care with the hospitalist will admit to telemetry bed.     Vida Roller, MD 12/28/12 [phone]

## 2012-12-28 NOTE — Progress Notes (Signed)
Echocardiogram 2D Echocardiogram has been performed.  Connor Haas 12/28/2012, 10:44 AM

## 2012-12-28 NOTE — Progress Notes (Signed)
EEG completed.

## 2012-12-28 NOTE — Progress Notes (Signed)
Patient seen and examined. Admission note reviewed.   Syncope: ECHO ordered. First set troponin negative. Orthostatic positive. Increase IV fluids. EEG ordered.  Alcohol use: Monitor CIWA protocol. Thiamine and   Verdis Koval. Md.

## 2012-12-28 NOTE — ED Notes (Signed)
Bed:WA19<BR> Expected date:<BR> Expected time:<BR> Means of arrival:<BR> Comments:<BR> EMS

## 2012-12-28 NOTE — Progress Notes (Signed)
   CARE MANAGEMENT NOTE 12/28/2012  Patient:  Connor Haas, Connor Haas   Account Number:  [id number]  Date Initiated:  12/28/2012  Documentation initiated by:  Jiles Crocker  Subjective/Objective Assessment:   ADMITTED WITH SYNCOPAL EPISODE     Action/Plan:   PCP IS DR PAZ  LIVES WITH SPOUSE; SOC WORKER REFERRAL PLACED FOR ETOH   Anticipated DC Date:  12/30/2012   Anticipated DC Plan:  HOME/SELF CARE  In-house referral  Clinical Social Worker      DC Planning Services  CM consult         Status of service:  In process, will continue to follow Medicare Important Message given?  NA - LOS <3 / Initial given by admissions (If response is "NO", the following Medicare IM given date fields will be blank) Per UR Regulation:  Reviewed for med. necessity/level of care/duration of stay Comments:  12/28/2012- B Fernado Brigante RN,BSN,MHA

## 2012-12-28 NOTE — H&P (Signed)
[facility] History and Physical  Connor Haas ZOX:[id number] DOB: 1955-12-18 DOA: 12/28/2012  Referring physician: Dr. Hyacinth Meeker. PCP: Willow Ora, MD  Specialists: None.  Chief Complaint: Loss of consciousness.  HPI: Connor Haas is a 57 y.o. male with history of anxiety and alcohol and tobacco abuse last midnight started developing chest burning sensation and had gone to drink some milk in his kitchen and on his way back felt dizzy and hit his face on the floor lost consciousness. Patient's wife immediately arrived at the site by then patient regained consciousness and again lost consciousness briefly for a few seconds. EMS was called and his blood pressure initially was found to be in the low 70s systolic. Patient was brought to the ER. He had facial laceration which was sutured. CT head neck and EKG were unremarkable. Troponins were negative. Patient has been admitted for further observation. Presently patient is asymptomatic and denies any chest pain or shortness of breath. Patient when he lost his consciousness the second time he had incontinence of urine but did not have any seizure-like activities or any tongue bite headache focal deficits. He has not missed his alcohol.  Review of Systems: As presented in the history of presenting illness nothing else significant.  History reviewed. No pertinent past medical history. History reviewed. No pertinent past surgical history. Social History:  reports that he has been smoking.  He does not have any smokeless tobacco history on file. He reports that  drinks alcohol. His drug history is not on file. Lives at home with his wife. where does patient live-- Can do ADLs. Can patient participate in ADLs?  Allergies  Allergen Reactions  . Sertraline Hcl     REACTION: headache    Family History  Problem Relation Age of Onset  . CAD Father       Prior to Admission medications   Medication Sig Start Date End Date Taking? Authorizing Provider   ALPRAZolam Prudy Feeler) 1 MG tablet 1/2-1 tablet daily as needed 05/25/11   Wanda Plump, MD   Physical Exam: Filed Vitals:   12/28/12 0133 12/28/12 0145 12/28/12 0511  BP:  147/86 137/76  Pulse:  93 93  Temp:  98.8 F (37.1 C)   TempSrc:  Oral   Resp:  18 18  Height:  6\' 1"  (1.854 m)   Weight:  99.791 kg (220 lb)   SpO2: 97% 94% 96%     General:  Well-developed and nourished.  Eyes: Anicteric no pallor.  ENT: There is a laceration to his chin which has been sutured and there is multiple bruises on his face. No discharge otherwise from his ears nose and mouth.  Neck: No JVD or masses.  Cardiovascular: S1-S2 heard regular.  Respiratory: No rhonchi or crepitations.  Abdomen: Soft nontender bowel sounds present.  Skin: No rash. But there is bruises on his face.  Musculoskeletal: No edema.  Psychiatric: Appears normal.  Neurologic: Alert awake oriented to time place and person. Moves all extremities.  Labs on Admission:  Basic Metabolic Panel:  Recent Labs Lab 12/28/12 0210 12/28/12 0221  NA 135 140  K 3.8 3.8  CL 98 106  CO2 22  --   GLUCOSE 135* 129*  BUN 17 19  CREATININE 0.95 1.00  CALCIUM 8.5  --    Liver Function Tests:  Recent Labs Lab 12/28/12 0210  AST 34  ALT 52  ALKPHOS 107  BILITOT 0.2*  PROT 6.7  ALBUMIN 3.3*   No results found for  this basename: LIPASE, AMYLASE,  in the last 168 hours No results found for this basename: AMMONIA,  in the last 168 hours CBC:  Recent Labs Lab 12/28/12 0210 12/28/12 0221  WBC 11.0*  --   NEUTROABS 9.7*  --   HGB 17.4* 17.7*  HCT 49.4 52.0  MCV 89.2  --   PLT 205  --    Cardiac Enzymes: No results found for this basename: CKTOTAL, CKMB, CKMBINDEX, TROPONINI,  in the last 168 hours  BNP (last 3 results) No results found for this basename: PROBNP,  in the last 8760 hours CBG: No results found for this basename: GLUCAP,  in the last 168 hours  Radiological Exams on Admission: Ct Head Wo  Contrast  12/28/2012  *RADIOLOGY REPORT*  Clinical Data:  The patient passed out and has a scrape on the left cheek and neck pain.  CT HEAD WITHOUT CONTRAST CT MAXILLOFACIAL WITHOUT CONTRAST CT CERVICAL SPINE WITHOUT CONTRAST  Technique:  Multidetector CT imaging of the head, cervical spine, and maxillofacial structures were performed using the standard protocol without intravenous contrast. Multiplanar CT image reconstructions of the cervical spine and maxillofacial structures were also generated.  Comparison:   None  CT HEAD  Findings: The ventricles and sulci are symmetrical without significant effacement, displacement, or dilatation. No mass effect or midline shift. No abnormal extra-axial fluid collections. The grey-white matter junction is distinct. Basal cisterns are not effaced. No acute intracranial hemorrhage. No depressed skull fractures.  Mastoid air cells are not opacified.  IMPRESSION: No acute intracranial abnormalities.  CT MAXILLOFACIAL  Findings:  The globes and extraocular muscles appear intact and symmetrical.  There are retention cysts in the maxillary antra bilaterally.  No acute air-fluid levels in the paranasal sinuses. The nasal bones demonstrate slight irregularity suggesting possible mild fracture deformity of indeterminate age.  The orbital rims, maxillary antral walls, nasal septum, nasal spine, maxilla, pterygoid plates, zygomatic arches, temporomandibular joints, and mandibles appear intact.  No displaced fractures are identified.  IMPRESSION: Mild nasal bone deformities suggesting possibility of nasal fractures of indeterminate age.  No displaced orbital or facial fractures are demonstrated.  Retention cysts in the paranasal sinuses.  CT CERVICAL SPINE  Findings:   Normal alignment of the cervical vertebrae and facet joints.  Lateral masses of C1 appear symmetrical.  The odontoid process appears intact.  Degenerative changes with narrowed disc spaces and endplate hypertrophic changes  at C3-4 and C5-6 levels. 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  No vertebral compression deformities.  No prevertebral soft tissue swelling.  No focal bone lesion or bone destruction.  The bone cortex and trabecular architecture appear intact.  No paraspinal soft tissue infiltration.  IMPRESSION: No displaced cervical spine fractures identified.  Degenerative changes.  Congenital coalition of C7-T1.   Original Report Authenticated By: Burman Nieves, M.D.    Ct Cervical Spine Wo Contrast  12/28/2012  *RADIOLOGY REPORT*  Clinical Data:  The patient passed out and has a scrape on the left cheek and neck pain.  CT HEAD WITHOUT CONTRAST CT MAXILLOFACIAL WITHOUT CONTRAST CT CERVICAL SPINE WITHOUT CONTRAST  Technique:  Multidetector CT imaging of the head, cervical spine, and maxillofacial structures were performed using the standard protocol without intravenous contrast. Multiplanar CT image reconstructions of the cervical spine and maxillofacial structures were also generated.  Comparison:   None  CT HEAD  Findings: The ventricles and sulci are symmetrical without significant effacement, displacement, or dilatation. No mass effect or midline shift. No  abnormal extra-axial fluid collections. The grey-white matter junction is distinct. Basal cisterns are not effaced. No acute intracranial hemorrhage. No depressed skull fractures.  Mastoid air cells are not opacified.  IMPRESSION: No acute intracranial abnormalities.  CT MAXILLOFACIAL  Findings:  The globes and extraocular muscles appear intact and symmetrical.  There are retention cysts in the maxillary antra bilaterally.  No acute air-fluid levels in the paranasal sinuses. The nasal bones demonstrate slight irregularity suggesting possible mild fracture deformity of indeterminate age.  The orbital rims, maxillary antral walls, nasal septum, nasal spine, maxilla, pterygoid plates, zygomatic arches, temporomandibular joints,  and mandibles appear intact.  No displaced fractures are identified.  IMPRESSION: Mild nasal bone deformities suggesting possibility of nasal fractures of indeterminate age.  No displaced orbital or facial fractures are demonstrated.  Retention cysts in the paranasal sinuses.  CT CERVICAL SPINE  Findings:   Normal alignment of the cervical vertebrae and facet joints.  Lateral masses of C1 appear symmetrical.  The odontoid process appears intact.  Degenerative changes with narrowed disc spaces and endplate hypertrophic changes at C3-4 and C5-6 levels. 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  No vertebral compression deformities.  No prevertebral soft tissue swelling.  No focal bone lesion or bone destruction.  The bone cortex and trabecular architecture appear intact.  No paraspinal soft tissue infiltration.  IMPRESSION: No displaced cervical spine fractures identified.  Degenerative changes.  Congenital coalition of C7-T1.   Original Report Authenticated By: Burman Nieves, M.D.    Dg Chest Port 1 View  12/28/2012  *RADIOLOGY REPORT*  Clinical Data: Syncope.  Chest pain.  PORTABLE CHEST - 1 VIEW  Comparison: None.  Findings: Mild hyperinflation.  Scattered calcified granulomas in the lungs.  Normal heart size and pulmonary vascularity.  No focal consolidation or airspace disease in the lungs.  No blunting of costophrenic angles.  No pneumothorax.  Mediastinal contours appear intact.  IMPRESSION: No evidence of active pulmonary disease.   Original Report Authenticated By: Burman Nieves, M.D.    Ct Maxillofacial Wo Cm  12/28/2012  *RADIOLOGY REPORT*  Clinical Data:  The patient passed out and has a scrape on the left cheek and neck pain.  CT HEAD WITHOUT CONTRAST CT MAXILLOFACIAL WITHOUT CONTRAST CT CERVICAL SPINE WITHOUT CONTRAST  Technique:  Multidetector CT imaging of the head, cervical spine, and maxillofacial structures were performed using the standard protocol  without intravenous contrast. Multiplanar CT image reconstructions of the cervical spine and maxillofacial structures were also generated.  Comparison:   None  CT HEAD  Findings: The ventricles and sulci are symmetrical without significant effacement, displacement, or dilatation. No mass effect or midline shift. No abnormal extra-axial fluid collections. The grey-white matter junction is distinct. Basal cisterns are not effaced. No acute intracranial hemorrhage. No depressed skull fractures.  Mastoid air cells are not opacified.  IMPRESSION: No acute intracranial abnormalities.  CT MAXILLOFACIAL  Findings:  The globes and extraocular muscles appear intact and symmetrical.  There are retention cysts in the maxillary antra bilaterally.  No acute air-fluid levels in the paranasal sinuses. The nasal bones demonstrate slight irregularity suggesting possible mild fracture deformity of indeterminate age.  The orbital rims, maxillary antral walls, nasal septum, nasal spine, maxilla, pterygoid plates, zygomatic arches, temporomandibular joints, and mandibles appear intact.  No displaced fractures are identified.  IMPRESSION: Mild nasal bone deformities suggesting possibility of nasal fractures of indeterminate age.  No displaced orbital or facial fractures are demonstrated.  Retention cysts in the paranasal sinuses.  CT CERVICAL SPINE  Findings:   Normal alignment of the cervical vertebrae and facet joints.  Lateral masses of C1 appear symmetrical.  The odontoid process appears intact.  Degenerative changes with narrowed disc spaces and endplate hypertrophic changes at C3-4 and C5-6 levels. 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  No vertebral compression deformities.  No prevertebral soft tissue swelling.  No focal bone lesion or bone destruction.  The bone cortex and trabecular architecture appear intact.  No paraspinal soft tissue infiltration.  IMPRESSION: No displaced cervical  spine fractures identified.  Degenerative changes.  Congenital coalition of C7-T1.   Original Report Authenticated By: Burman Nieves, M.D.     EKG: Independently reviewed. Sinus rhythm with Q waves in the inferior leads and nonspecific T-wave changes.  Assessment/Plan Principal Problem:   Syncope Active Problems:   ANXIETY   ALCOHOL ABUSE   NICOTINE ADDICTION   1. Syncope - causes not clear. Patient was hypotensive when EMS had initially seen the patient. Presently we'll continue to hydrate recheck orthostatics later. Monitor in telemetry for any arrhythmias. Check cardiac markers and d-dimer. Check EEG and 2-D echo. 2. Atypical chest pain - presently chest pain-free. EKG shows nonspecific findings. Cycle cardiac markers and d-dimer and 2-D echo. Aspirin and Protonix. 3. Alcohol abuse - patient has been placed on alcohol withdrawal protocol. 4. Tobacco abuse - advised to quit smoking. 5. Fall with facial laceration and possible nasal bone fracture.    Code Status: Full code.  Family Communication: Patient's wife.  Disposition Plan: Admit for observation.    Taleigha Pinson N. [facility] Pager [phone].  If 7PM-7AM, please contact night-coverage [email or web address] Password [facility] 12/28/2012, 5:26 AM

## 2012-12-28 NOTE — Procedures (Signed)
History: 57 yo M with syncope  Sedation: None  Background: There is a well defined posterior dominant rhythm of 10 Hz that attenuates with eye opening. The background consists of intermixed beta and alpha frequencies with an increase in slow activity during drowsiness. Stage II sleep is briefly seen.   Photic stimulation: Physiologic driving is present  EEG Abnormalities: Nonw  Clinical Interpretation: This normal EEG is recorded in the waking and sleep state. There was no seizure or seizure predisposition recorded on this study.   Ritta Slot, MD [facility] [phone]  If 7pm- 7am, please page neurology on call at [phone].

## 2012-12-29 DIAGNOSIS — S0180XA Unspecified open wound of other part of head, initial encounter: Secondary | ICD-10-CM

## 2012-12-29 LAB — BASIC METABOLIC PANEL
CO2: 22 mEq/L (ref 19–32)
Calcium: 8.4 mg/dL (ref 8.4–10.5)
Creatinine, Ser: 1 mg/dL (ref 0.50–1.35)
GFR calc non Af Amer: 82 mL/min — ABNORMAL LOW (ref >90)
Sodium: 135 mEq/L (ref 135–145)

## 2012-12-29 LAB — CBC
MCV: 90.2 fL (ref 78.0–100.0)
Platelets: 176 10*3/uL (ref 150–400)
RBC: 5.28 MIL/uL (ref 4.22–5.81)
RDW: 13.1 % (ref 11.5–15.5)
WBC: 7 10*3/uL (ref 4.0–10.5)

## 2012-12-29 MED ORDER — SODIUM CHLORIDE 0.9 % IV SOLN
INTRAVENOUS | Status: DC
  Administered 2012-12-29: 10:00:00 via INTRAVENOUS

## 2012-12-29 MED ORDER — FOLIC ACID 1 MG PO TABS: 1.0000 mg | ORAL_TABLET | Freq: Every day | ORAL | Status: DC

## 2012-12-29 MED ORDER — THIAMINE HCL 100 MG PO TABS: 100.0000 mg | ORAL_TABLET | Freq: Every day | ORAL | Status: DC

## 2012-12-29 MED ORDER — SIMETHICONE 80 MG PO CHEW
160.0000 mg | CHEWABLE_TABLET | Freq: Four times a day (QID) | ORAL | Status: DC | PRN
  Filled 2012-12-29: qty 2

## 2012-12-29 MED ORDER — PANTOPRAZOLE SODIUM 40 MG PO TBEC: 40.0000 mg | DELAYED_RELEASE_TABLET | Freq: Every day | ORAL | Status: DC

## 2012-12-29 MED ORDER — NICOTINE 14 MG/24HR TD PT24: 1.0000 | MEDICATED_PATCH | Freq: Every day | TRANSDERMAL | Status: DC

## 2012-12-29 NOTE — Discharge Summary (Signed)
Physician Discharge Summary  IZMAEL DUROSS Haas:[id number] DOB: 25-Jan-1956 DOA: 12/28/2012  PCP: Willow Ora, MD  Admit date: 12/28/2012 Discharge date: 12/29/2012  Time spent: 30  minutes  Recommendations for Outpatient Follow-up:  1. Follow up up with PCP, consider Holter monitor.   Discharge Diagnoses:    Syncope, orthostatic   ANXIETY   ALCOHOL USE   NICOTINE ADDICTION   Discharge Condition: stable  Diet recommendation: Heart healthy  Filed Weights   12/28/12 0145 12/28/12 0611 12/29/12 0503  Weight: 99.791 kg (220 lb) 98.7 kg (217 lb 9.5 oz) 100.018 kg (220 lb 8 oz)    History of present illness:  Connor Haas is a 57 y.o. male with history of anxiety and alcohol and tobacco abuse last midnight started developing chest burning sensation and had gone to drink some milk in his kitchen and on his way back felt dizzy and hit his face on the floor lost consciousness. Patient's wife immediately arrived at the site by then patient regained consciousness and again lost consciousness briefly for a few seconds. EMS was called and his blood pressure initially was found to be in the low 70s systolic. Patient was brought to the ER. He had facial laceration which was sutured. CT head neck and EKG were unremarkable. Troponins were negative. Patient has been admitted for further observation. Presently patient is asymptomatic and denies any chest pain or shortness of breath. Patient when he lost his consciousness the second time he had incontinence of urine but did not have any seizure-like activities or any tongue bite headache focal deficits. He has not missed his alcohol.   Hospital Course:  1-Syncope: likely orthostatic hypotension. Patient was admitted to telemetry. Cardiac enzymes times 3 negative. ECHO no wall motion abnormalities, no valvular diseases. CT head negative. EEG negative for seizure focus. D dimer negative. Patient BP on admission lying at 157 standing at 136. Repeated orthostatic  after IV fluids negative. He will need to follow up with PCP and consider referral to cardiology for Holter monitor.  2-Alcohol use; no evidences of DT.  3.Fall with facial laceration and possible nasal bone fracture.   Procedures:  EEG: negative for seizure focus.  ECHO: Left ventricle: Technically difficult study. The cavity size was normal. Wall thickness was increased in a pattern of mild LVH. The estimated ejection fraction was 65%. Wall motion was normal; there were no regional wall motion abnormalities   Consultations:  none  Discharge Exam: Filed Vitals:   12/28/12 2210 12/29/12 0459 12/29/12 0502 12/29/12 0503  BP: 161/87 152/88 155/88 146/82  Pulse:  82 88   Temp:  98.9 F (37.2 C)    TempSrc:  Oral    Resp:  18    Height:      Weight:    100.018 kg (220 lb 8 oz)  SpO2:  98%      General: No distress. Cardiovascular: S 1, S 2 RRR Respiratory: CTA  Discharge Instructions  Discharge Orders   Future Orders Complete By Expires     Diet - low sodium heart healthy  As directed     Increase activity slowly  As directed         Medication List    TAKE these medications       ALPRAZolam 1 MG tablet  Commonly known as:  XANAX  Take 0.5 mg by mouth 3 (three) times daily as needed for sleep. For anxiety     aspirin EC 81 MG tablet  Take 81 mg  by mouth daily.     folic acid 1 MG tablet  Commonly known as:  FOLVITE  Take 1 tablet (1 mg total) by mouth daily.     MELATONIN PO  Take 1 tablet by mouth at bedtime. scheduled     pantoprazole 40 MG tablet  Commonly known as:  PROTONIX  Take 1 tablet (40 mg total) by mouth daily.     thiamine 100 MG tablet  Take 1 tablet (100 mg total) by mouth daily.          The results of significant diagnostics from this hospitalization (including imaging, microbiology, ancillary and laboratory) are listed below for reference.    Significant Diagnostic Studies: Ct Head Wo Contrast  12/28/2012  *RADIOLOGY  REPORT*  Clinical Data:  The patient passed out and has a scrape on the left cheek and neck pain.  CT HEAD WITHOUT CONTRAST CT MAXILLOFACIAL WITHOUT CONTRAST CT CERVICAL SPINE WITHOUT CONTRAST  Technique:  Multidetector CT imaging of the head, cervical spine, and maxillofacial structures were performed using the standard protocol without intravenous contrast. Multiplanar CT image reconstructions of the cervical spine and maxillofacial structures were also generated.  Comparison:   None  CT HEAD  Findings: The ventricles and sulci are symmetrical without significant effacement, displacement, or dilatation. No mass effect or midline shift. No abnormal extra-axial fluid collections. The grey-white matter junction is distinct. Basal cisterns are not effaced. No acute intracranial hemorrhage. No depressed skull fractures.  Mastoid air cells are not opacified.  IMPRESSION: No acute intracranial abnormalities.  CT MAXILLOFACIAL  Findings:  The globes and extraocular muscles appear intact and symmetrical.  There are retention cysts in the maxillary antra bilaterally.  No acute air-fluid levels in the paranasal sinuses. The nasal bones demonstrate slight irregularity suggesting possible mild fracture deformity of indeterminate age.  The orbital rims, maxillary antral walls, nasal septum, nasal spine, maxilla, pterygoid plates, zygomatic arches, temporomandibular joints, and mandibles appear intact.  No displaced fractures are identified.  IMPRESSION: Mild nasal bone deformities suggesting possibility of nasal fractures of indeterminate age.  No displaced orbital or facial fractures are demonstrated.  Retention cysts in the paranasal sinuses.  CT CERVICAL SPINE  Findings:   Normal alignment of the cervical vertebrae and facet joints.  Lateral masses of C1 appear symmetrical.  The odontoid process appears intact.  Degenerative changes with narrowed disc spaces and endplate hypertrophic changes at C3-4 and C5-6 levels.  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  No vertebral compression deformities.  No prevertebral soft tissue swelling.  No focal bone lesion or bone destruction.  The bone cortex and trabecular architecture appear intact.  No paraspinal soft tissue infiltration.  IMPRESSION: No displaced cervical spine fractures identified.  Degenerative changes.  Congenital coalition of C7-T1.   Original Report Authenticated By: Burman Nieves, M.D.    Ct Cervical Spine Wo Contrast  12/28/2012  *RADIOLOGY REPORT*  Clinical Data:  The patient passed out and has a scrape on the left cheek and neck pain.  CT HEAD WITHOUT CONTRAST CT MAXILLOFACIAL WITHOUT CONTRAST CT CERVICAL SPINE WITHOUT CONTRAST  Technique:  Multidetector CT imaging of the head, cervical spine, and maxillofacial structures were performed using the standard protocol without intravenous contrast. Multiplanar CT image reconstructions of the cervical spine and maxillofacial structures were also generated.  Comparison:   None  CT HEAD  Findings: The ventricles and sulci are symmetrical without significant effacement, displacement, or dilatation. No mass effect or midline shift. No abnormal extra-axial  fluid collections. The grey-white matter junction is distinct. Basal cisterns are not effaced. No acute intracranial hemorrhage. No depressed skull fractures.  Mastoid air cells are not opacified.  IMPRESSION: No acute intracranial abnormalities.  CT MAXILLOFACIAL  Findings:  The globes and extraocular muscles appear intact and symmetrical.  There are retention cysts in the maxillary antra bilaterally.  No acute air-fluid levels in the paranasal sinuses. The nasal bones demonstrate slight irregularity suggesting possible mild fracture deformity of indeterminate age.  The orbital rims, maxillary antral walls, nasal septum, nasal spine, maxilla, pterygoid plates, zygomatic arches, temporomandibular joints, and mandibles appear  intact.  No displaced fractures are identified.  IMPRESSION: Mild nasal bone deformities suggesting possibility of nasal fractures of indeterminate age.  No displaced orbital or facial fractures are demonstrated.  Retention cysts in the paranasal sinuses.  CT CERVICAL SPINE  Findings:   Normal alignment of the cervical vertebrae and facet joints.  Lateral masses of C1 appear symmetrical.  The odontoid process appears intact.  Degenerative changes with narrowed disc spaces and endplate hypertrophic changes at C3-4 and C5-6 levels. 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  No vertebral compression deformities.  No prevertebral soft tissue swelling.  No focal bone lesion or bone destruction.  The bone cortex and trabecular architecture appear intact.  No paraspinal soft tissue infiltration.  IMPRESSION: No displaced cervical spine fractures identified.  Degenerative changes.  Congenital coalition of C7-T1.   Original Report Authenticated By: Burman Nieves, M.D.    Dg Chest Port 1 View  12/28/2012  *RADIOLOGY REPORT*  Clinical Data: Syncope.  Chest pain.  PORTABLE CHEST - 1 VIEW  Comparison: None.  Findings: Mild hyperinflation.  Scattered calcified granulomas in the lungs.  Normal heart size and pulmonary vascularity.  No focal consolidation or airspace disease in the lungs.  No blunting of costophrenic angles.  No pneumothorax.  Mediastinal contours appear intact.  IMPRESSION: No evidence of active pulmonary disease.   Original Report Authenticated By: Burman Nieves, M.D.    Ct Maxillofacial Wo Cm  12/28/2012  *RADIOLOGY REPORT*  Clinical Data:  The patient passed out and has a scrape on the left cheek and neck pain.  CT HEAD WITHOUT CONTRAST CT MAXILLOFACIAL WITHOUT CONTRAST CT CERVICAL SPINE WITHOUT CONTRAST  Technique:  Multidetector CT imaging of the head, cervical spine, and maxillofacial structures were performed using the standard protocol without intravenous  contrast. Multiplanar CT image reconstructions of the cervical spine and maxillofacial structures were also generated.  Comparison:   None  CT HEAD  Findings: The ventricles and sulci are symmetrical without significant effacement, displacement, or dilatation. No mass effect or midline shift. No abnormal extra-axial fluid collections. The grey-white matter junction is distinct. Basal cisterns are not effaced. No acute intracranial hemorrhage. No depressed skull fractures.  Mastoid air cells are not opacified.  IMPRESSION: No acute intracranial abnormalities.  CT MAXILLOFACIAL  Findings:  The globes and extraocular muscles appear intact and symmetrical.  There are retention cysts in the maxillary antra bilaterally.  No acute air-fluid levels in the paranasal sinuses. The nasal bones demonstrate slight irregularity suggesting possible mild fracture deformity of indeterminate age.  The orbital rims, maxillary antral walls, nasal septum, nasal spine, maxilla, pterygoid plates, zygomatic arches, temporomandibular joints, and mandibles appear intact.  No displaced fractures are identified.  IMPRESSION: Mild nasal bone deformities suggesting possibility of nasal fractures of indeterminate age.  No displaced orbital or facial fractures are demonstrated.  Retention cysts in the paranasal sinuses.  CT  CERVICAL SPINE  Findings:   Normal alignment of the cervical vertebrae and facet joints.  Lateral masses of C1 appear symmetrical.  The odontoid process appears intact.  Degenerative changes with narrowed disc spaces and endplate hypertrophic changes at C3-4 and C5-6 levels. 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  No vertebral compression deformities.  No prevertebral soft tissue swelling.  No focal bone lesion or bone destruction.  The bone cortex and trabecular architecture appear intact.  No paraspinal soft tissue infiltration.  IMPRESSION: No displaced cervical spine fractures  identified.  Degenerative changes.  Congenital coalition of C7-T1.   Original Report Authenticated By: Burman Nieves, M.D.     Microbiology: No results found for this or any previous visit (from the past 240 hour(s)).   Labs: Basic Metabolic Panel:  Recent Labs Lab 12/28/12 0210 12/28/12 0221 12/29/12 0422  NA 135 140 135  K 3.8 3.8 4.1  CL 98 106 103  CO2 22  --  22  GLUCOSE 135* 129* 104*  BUN 17 19 12   CREATININE 0.95 1.00 1.00  CALCIUM 8.5  --  8.4   Liver Function Tests:  Recent Labs Lab 12/28/12 0210  AST 34  ALT 52  ALKPHOS 107  BILITOT 0.2*  PROT 6.7  ALBUMIN 3.3*   No results found for this basename: LIPASE, AMYLASE,  in the last 168 hours No results found for this basename: AMMONIA,  in the last 168 hours CBC:  Recent Labs Lab 12/28/12 0210 12/28/12 0221 12/29/12 0422  WBC 11.0*  --  7.0  NEUTROABS 9.7*  --   --   HGB 17.4* 17.7* 15.8  HCT 49.4 52.0 47.6  MCV 89.2  --  90.2  PLT 205  --  176   Cardiac Enzymes:  Recent Labs Lab 12/28/12 0533 12/28/12 1120 12/28/12 1707  TROPONINI <0.30 <0.30 <0.30   BNP: BNP (last 3 results) No results found for this basename: PROBNP,  in the last 8760 hours CBG: No results found for this basename: GLUCAP,  in the last 168 hours     Signed:  Adler Alton  [facility] 12/29/2012, 9:58 AM

## 2013-01-08 ENCOUNTER — Telehealth: Payer: Self-pay | Admitting: Internal Medicine

## 2013-01-08 NOTE — Telephone Encounter (Signed)
Was recently admitted to the hospital, was recommended a f/u , please arrange if pt in agreement

## 2013-01-09 NOTE — Telephone Encounter (Signed)
Thank you :)

## 2013-01-09 NOTE — Telephone Encounter (Signed)
Pt states he has made an appointment with another doctor. Declines appt w/Dr. Drue Novel at this time.

## 2013-08-17 ENCOUNTER — Other Ambulatory Visit: Payer: Self-pay

## 2013-10-05 ENCOUNTER — Encounter (HOSPITAL_COMMUNITY): Payer: Self-pay | Admitting: Emergency Medicine

## 2013-10-05 ENCOUNTER — Emergency Department (HOSPITAL_COMMUNITY): Payer: BC Managed Care – PPO

## 2013-10-05 ENCOUNTER — Encounter (HOSPITAL_COMMUNITY)
Admission: EM | Disposition: A | Discharge status: Home / Self Care | Payer: Self-pay | Source: Home / Self Care | Attending: Emergency Medicine

## 2013-10-05 ENCOUNTER — Observation Stay (HOSPITAL_COMMUNITY)
Admission: EM | Admit: 2013-10-05 | Discharge: 2013-10-06 | Disposition: A | Discharge status: Home / Self Care | Payer: BC Managed Care – PPO | Attending: General Surgery | Admitting: General Surgery

## 2013-10-05 ENCOUNTER — Encounter (HOSPITAL_COMMUNITY): Payer: BC Managed Care – PPO | Admitting: Certified Registered"

## 2013-10-05 ENCOUNTER — Emergency Department (HOSPITAL_COMMUNITY): Payer: BC Managed Care – PPO | Admitting: Certified Registered"

## 2013-10-05 DIAGNOSIS — K37 Unspecified appendicitis: Secondary | ICD-10-CM

## 2013-10-05 DIAGNOSIS — I10 Essential (primary) hypertension: Secondary | ICD-10-CM | POA: Insufficient documentation

## 2013-10-05 DIAGNOSIS — K3580 Unspecified acute appendicitis: Secondary | ICD-10-CM

## 2013-10-05 DIAGNOSIS — F17200 Nicotine dependence, unspecified, uncomplicated: Secondary | ICD-10-CM | POA: Insufficient documentation

## 2013-10-05 DIAGNOSIS — Z7982 Long term (current) use of aspirin: Secondary | ICD-10-CM | POA: Insufficient documentation

## 2013-10-05 HISTORY — PX: LAPAROSCOPIC APPENDECTOMY: SHX408

## 2013-10-05 HISTORY — DX: Essential (primary) hypertension: I10

## 2013-10-05 HISTORY — DX: Unspecified acute appendicitis: K35.80

## 2013-10-05 LAB — CBC
Hemoglobin: 15.9 g/dL (ref 13.0–17.0)
RBC: 5.12 MIL/uL (ref 4.22–5.81)
WBC: 14.1 10*3/uL — ABNORMAL HIGH (ref 4.0–10.5)

## 2013-10-05 LAB — COMPREHENSIVE METABOLIC PANEL
ALT: 278 U/L — ABNORMAL HIGH (ref 0–53)
Alkaline Phosphatase: 125 U/L — ABNORMAL HIGH (ref 39–117)
CO2: 21 mEq/L (ref 19–32)
GFR calc Af Amer: >90 mL/min (ref >90)
GFR calc non Af Amer: >90 mL/min (ref >90)
Glucose, Bld: 130 mg/dL — ABNORMAL HIGH (ref 70–99)
Potassium: 4.3 mEq/L (ref 3.5–5.1)
Sodium: 133 mEq/L — ABNORMAL LOW (ref 135–145)

## 2013-10-05 LAB — URINALYSIS, ROUTINE W REFLEX MICROSCOPIC
Leukocytes, UA: NEGATIVE
Nitrite: NEGATIVE
Specific Gravity, Urine: 1.021 (ref 1.005–1.030)
pH: 6.5 (ref 5.0–8.0)

## 2013-10-05 LAB — GLUCOSE, CAPILLARY: Glucose-Capillary: 163 mg/dL — ABNORMAL HIGH (ref 70–99)

## 2013-10-05 SURGERY — APPENDECTOMY, LAPAROSCOPIC
Anesthesia: General | Site: Abdomen

## 2013-10-05 MED ORDER — BUPIVACAINE-EPINEPHRINE 0.25% -1:200000 IJ SOLN
INTRAMUSCULAR | Status: DC | PRN
  Administered 2013-10-05: 20 mL

## 2013-10-05 MED ORDER — ONDANSETRON HCL 4 MG/2ML IJ SOLN
INTRAMUSCULAR | Status: DC | PRN
  Administered 2013-10-05: 4 mg via INTRAVENOUS

## 2013-10-05 MED ORDER — FENTANYL CITRATE 0.05 MG/ML IJ SOLN
INTRAMUSCULAR | Status: DC | PRN
  Administered 2013-10-05 (×3): 50 ug via INTRAVENOUS
  Administered 2013-10-05: 100 ug via INTRAVENOUS

## 2013-10-05 MED ORDER — ONDANSETRON HCL 4 MG PO TABS: 4.0000 mg | ORAL_TABLET | Freq: Four times a day (QID) | ORAL | Status: DC | PRN

## 2013-10-05 MED ORDER — ROCURONIUM BROMIDE 100 MG/10ML IV SOLN
INTRAVENOUS | Status: AC
  Filled 2013-10-05: qty 1

## 2013-10-05 MED ORDER — LACTATED RINGERS IV SOLN: INTRAVENOUS | Status: DC

## 2013-10-05 MED ORDER — SODIUM CHLORIDE 0.9 % IV SOLN
1.0000 g | INTRAVENOUS | Status: AC
  Administered 2013-10-05: 1 g via INTRAVENOUS

## 2013-10-05 MED ORDER — HYDROMORPHONE HCL PF 1 MG/ML IJ SOLN
INTRAMUSCULAR | Status: DC | PRN
  Administered 2013-10-05 (×2): 1 mg via INTRAVENOUS

## 2013-10-05 MED ORDER — LACTATED RINGERS IV SOLN
INTRAVENOUS | Status: DC | PRN
  Administered 2013-10-05 (×2): via INTRAVENOUS

## 2013-10-05 MED ORDER — PROPOFOL 10 MG/ML IV BOLUS
INTRAVENOUS | Status: AC
  Filled 2013-10-05: qty 20

## 2013-10-05 MED ORDER — IOHEXOL 300 MG/ML  SOLN: 50.0000 mL | Freq: Once | INTRAMUSCULAR | Status: DC | PRN

## 2013-10-05 MED ORDER — FENTANYL CITRATE 0.05 MG/ML IJ SOLN
INTRAMUSCULAR | Status: AC
  Filled 2013-10-05: qty 5

## 2013-10-05 MED ORDER — HEPARIN SODIUM (PORCINE) 5000 UNIT/ML IJ SOLN
5000.0000 [IU] | Freq: Three times a day (TID) | INTRAMUSCULAR | Status: DC
  Administered 2013-10-05 – 2013-10-06 (×2): 5000 [IU] via SUBCUTANEOUS
  Filled 2013-10-05 (×5): qty 1

## 2013-10-05 MED ORDER — ONDANSETRON HCL 4 MG/2ML IJ SOLN
INTRAMUSCULAR | Status: AC
  Filled 2013-10-05: qty 2

## 2013-10-05 MED ORDER — HYDROMORPHONE HCL PF 2 MG/ML IJ SOLN
INTRAMUSCULAR | Status: AC
  Filled 2013-10-05: qty 1

## 2013-10-05 MED ORDER — DEXAMETHASONE SODIUM PHOSPHATE 10 MG/ML IJ SOLN
INTRAMUSCULAR | Status: DC | PRN
  Administered 2013-10-05: 10 mg via INTRAVENOUS

## 2013-10-05 MED ORDER — PROMETHAZINE HCL 25 MG/ML IJ SOLN: 6.2500 mg | INTRAMUSCULAR | Status: DC | PRN

## 2013-10-05 MED ORDER — SUCCINYLCHOLINE CHLORIDE 20 MG/ML IJ SOLN
INTRAMUSCULAR | Status: AC
  Filled 2013-10-05: qty 1

## 2013-10-05 MED ORDER — ONDANSETRON HCL 4 MG/2ML IJ SOLN: 4.0000 mg | Freq: Four times a day (QID) | INTRAMUSCULAR | Status: DC | PRN

## 2013-10-05 MED ORDER — KCL IN DEXTROSE-NACL 20-5-0.45 MEQ/L-%-% IV SOLN
INTRAVENOUS | Status: DC
  Administered 2013-10-05: 23:00:00 via INTRAVENOUS
  Filled 2013-10-05 (×3): qty 1000

## 2013-10-05 MED ORDER — LIDOCAINE HCL (PF) 2 % IJ SOLN
INTRAMUSCULAR | Status: DC | PRN
  Administered 2013-10-05: 30 mg via INTRADERMAL

## 2013-10-05 MED ORDER — MIDAZOLAM HCL 2 MG/2ML IJ SOLN
INTRAMUSCULAR | Status: AC
  Filled 2013-10-05: qty 2

## 2013-10-05 MED ORDER — GLYCOPYRROLATE 0.2 MG/ML IJ SOLN
INTRAMUSCULAR | Status: AC
  Filled 2013-10-05: qty 3

## 2013-10-05 MED ORDER — MIDAZOLAM HCL 5 MG/5ML IJ SOLN
INTRAMUSCULAR | Status: DC | PRN
  Administered 2013-10-05: 2 mg via INTRAVENOUS

## 2013-10-05 MED ORDER — LIDOCAINE HCL (CARDIAC) 20 MG/ML IV SOLN
INTRAVENOUS | Status: AC
  Filled 2013-10-05: qty 5

## 2013-10-05 MED ORDER — ONDANSETRON HCL 4 MG/2ML IJ SOLN
4.0000 mg | Freq: Once | INTRAMUSCULAR | Status: AC
  Administered 2013-10-05: 4 mg via INTRAVENOUS
  Filled 2013-10-05: qty 2

## 2013-10-05 MED ORDER — MORPHINE SULFATE 2 MG/ML IJ SOLN: 2.0000 mg | INTRAMUSCULAR | Status: DC | PRN

## 2013-10-05 MED ORDER — NEOSTIGMINE METHYLSULFATE 1 MG/ML IJ SOLN
INTRAMUSCULAR | Status: DC | PRN
  Administered 2013-10-05: 4 mg via INTRAVENOUS

## 2013-10-05 MED ORDER — DEXAMETHASONE SODIUM PHOSPHATE 10 MG/ML IJ SOLN
INTRAMUSCULAR | Status: AC
  Filled 2013-10-05: qty 1

## 2013-10-05 MED ORDER — MEPERIDINE HCL 25 MG/ML IJ SOLN: 6.2500 mg | INTRAMUSCULAR | Status: DC | PRN

## 2013-10-05 MED ORDER — SODIUM CHLORIDE 0.9 % IV BOLUS (SEPSIS)
1000.0000 mL | Freq: Once | INTRAVENOUS | Status: AC
  Administered 2013-10-05: 1000 mL via INTRAVENOUS

## 2013-10-05 MED ORDER — HYDROMORPHONE HCL PF 1 MG/ML IJ SOLN: 0.2500 mg | INTRAMUSCULAR | Status: DC | PRN

## 2013-10-05 MED ORDER — MORPHINE SULFATE 4 MG/ML IJ SOLN
4.0000 mg | Freq: Once | INTRAMUSCULAR | Status: AC
  Administered 2013-10-05: 4 mg via INTRAVENOUS
  Filled 2013-10-05: qty 1

## 2013-10-05 MED ORDER — GLYCOPYRROLATE 0.2 MG/ML IJ SOLN
INTRAMUSCULAR | Status: DC | PRN
  Administered 2013-10-05: 0.6 mg via INTRAVENOUS

## 2013-10-05 MED ORDER — SODIUM CHLORIDE 0.9 % IV SOLN
INTRAVENOUS | Status: AC
  Filled 2013-10-05: qty 1

## 2013-10-05 MED ORDER — BUPIVACAINE-EPINEPHRINE PF 0.25-1:200000 % IJ SOLN
INTRAMUSCULAR | Status: AC
  Filled 2013-10-05: qty 30

## 2013-10-05 MED ORDER — ROCURONIUM BROMIDE 100 MG/10ML IV SOLN
INTRAVENOUS | Status: DC | PRN
  Administered 2013-10-05: 10 mg via INTRAVENOUS
  Administered 2013-10-05: 30 mg via INTRAVENOUS

## 2013-10-05 MED ORDER — IOHEXOL 300 MG/ML  SOLN
100.0000 mL | Freq: Once | INTRAMUSCULAR | Status: AC | PRN
  Administered 2013-10-05: 100 mL via INTRAVENOUS

## 2013-10-05 MED ORDER — PROPOFOL 10 MG/ML IV BOLUS
INTRAVENOUS | Status: DC | PRN
  Administered 2013-10-05: 200 mg via INTRAVENOUS

## 2013-10-05 MED ORDER — MEPERIDINE HCL 50 MG/ML IJ SOLN: 6.2500 mg | INTRAMUSCULAR | Status: DC | PRN

## 2013-10-05 MED ORDER — SUCCINYLCHOLINE CHLORIDE 20 MG/ML IJ SOLN
INTRAMUSCULAR | Status: DC | PRN
  Administered 2013-10-05: 140 mg via INTRAVENOUS

## 2013-10-05 SURGICAL SUPPLY — 37 items
APPLIER CLIP ROT 10 11.4 M/L (STAPLE)
BENZOIN TINCTURE PRP APPL 2/3 (GAUZE/BANDAGES/DRESSINGS) ×2 IMPLANT
CANISTER SUCTION 2500CC (MISCELLANEOUS) ×2 IMPLANT
CLIP APPLIE ROT 10 11.4 M/L (STAPLE) IMPLANT
COVER SURGICAL LIGHT HANDLE (MISCELLANEOUS) ×2 IMPLANT
CUTTER FLEX LINEAR 45M (STAPLE) IMPLANT
DECANTER SPIKE VIAL GLASS SM (MISCELLANEOUS) ×2 IMPLANT
DERMABOND ADVANCED (GAUZE/BANDAGES/DRESSINGS) ×1
DERMABOND ADVANCED .7 DNX12 (GAUZE/BANDAGES/DRESSINGS) ×1 IMPLANT
DRAPE LAPAROSCOPIC ABDOMINAL (DRAPES) ×2 IMPLANT
ELECT REM PT RETURN 9FT ADLT (ELECTROSURGICAL) ×2
ELECTRODE REM PT RTRN 9FT ADLT (ELECTROSURGICAL) ×1 IMPLANT
ENDOLOOP SUT PDS II  0 18 (SUTURE)
ENDOLOOP SUT PDS II 0 18 (SUTURE) IMPLANT
GLOVE BIOGEL M 8.0 STRL (GLOVE) ×2 IMPLANT
GLOVE BIOGEL PI IND STRL 7.0 (GLOVE) ×1 IMPLANT
GLOVE BIOGEL PI INDICATOR 7.0 (GLOVE) ×1
GOWN PREVENTION PLUS LG XLONG (DISPOSABLE) ×2 IMPLANT
GOWN STRL REIN XL XLG (GOWN DISPOSABLE) ×4 IMPLANT
KIT BASIN OR (CUSTOM PROCEDURE TRAY) ×2 IMPLANT
NS IRRIG 1000ML POUR BTL (IV SOLUTION) ×2 IMPLANT
PENCIL BUTTON HOLSTER BLD 10FT (ELECTRODE) IMPLANT
POUCH SPECIMEN RETRIEVAL 10MM (ENDOMECHANICALS) ×2 IMPLANT
RELOAD 45 VASCULAR/THIN (ENDOMECHANICALS) IMPLANT
RELOAD STAPLE TA45 3.5 REG BLU (ENDOMECHANICALS) ×2 IMPLANT
SCALPEL HARMONIC ACE (MISCELLANEOUS) ×2 IMPLANT
SET IRRIG TUBING LAPAROSCOPIC (IRRIGATION / IRRIGATOR) ×2 IMPLANT
SOLUTION ANTI FOG 6CC (MISCELLANEOUS) ×2 IMPLANT
STRIP CLOSURE SKIN 1/2X4 (GAUZE/BANDAGES/DRESSINGS) ×2 IMPLANT
SUT VIC AB 4-0 SH 18 (SUTURE) ×2 IMPLANT
SYR 30ML LL (SYRINGE) ×2 IMPLANT
TRAY FOLEY CATH 14FRSI W/METER (CATHETERS) ×2 IMPLANT
TRAY LAP CHOLE (CUSTOM PROCEDURE TRAY) ×2 IMPLANT
TROCAR BLADELESS OPT 5 100 (ENDOMECHANICALS) ×2 IMPLANT
TROCAR XCEL BLUNT TIP 100MML (ENDOMECHANICALS) ×2 IMPLANT
TROCAR XCEL NON-BLD 11X100MML (ENDOMECHANICALS) ×2 IMPLANT
TUBING INSUFFLATION 10FT LAP (TUBING) ×2 IMPLANT

## 2013-10-05 NOTE — ED Provider Notes (Signed)
EKG Interpretation   None        Keirston Saephanh T Shaheen Mende, MD 10/05/13 2307 

## 2013-10-05 NOTE — Op Note (Signed)
Surgeon: Wenda Low, MD, FACS  Asst:  none  Anes:  Gen.  Procedure: Laparoscopic appendectomy  Diagnosis: Acute separative appendicitis  Complications: none  EBL:   minimal cc  Description of Procedure:  The patient was taken to room 1 on Christmas day. Gen. Anesthesia was given and a Foley inserted. The abdomen was prepped with PCMX and draped sterilely. A timeout was performed. Access to the abdomen was achieved through the umbilicus with a longitudinal incision into what is a small umbilical hernia. Upon entering the abdomen was insufflated and an 11 was placed obliquely in the left lower quadrant and a 5 in the right upper quadrant. The appendix was large and covered with a yellow patient date. I was able to elevated and isolate the mesentery the appendix. I went through the mesentery of the appendix with the harmonic scalpel controlling and maintaining hemostasis. Once the base of the appendix was isolated it was transected with a single application of a vascular load Ethicon endostapler. The appendix was placed in a bag and brought to the umbilicus. The umbilical defect was closed with laparoscopic vision using 2-0 Vicryl sutures. The ports were all injected with 20 cc of quarter percent Marcaine. The abdomen was reinspected. The liver was expected and he had a mild mottled appearance suggesting possible early cirrhosis. With this fatty omentum and a BCG this could be either from his known alcohol consumption or could have a steatohepatitis component. Dermabond was used in the incisions the patient was then taken recovery in satisfactory condition.  Matt B. Daphine Deutscher, MD, [facility], [place] [phone]

## 2013-10-05 NOTE — ED Provider Notes (Signed)
Presents with low abdominal pain rate back onset this morning. Comment by several episodes of vomiting. Last bowel movement today slightly loose to. No blood per rectum no melena. On exam alert appears uncomfortable Glasgow Coma Score 15. abdomen nondistended. Tender right lower quadrant. No guarding no rigidity no rebound genitalia normal male  Doug Sou, MD 10/05/13 [phone]

## 2013-10-05 NOTE — ED Notes (Signed)
Pt from home c/o  Abdominal pain  Located in right lower quadrant since 5 a.m. Today. He reports that he has vomited 4 or 5 times this morning denies diarrhea.. He does drink alcohol on a daily basis. Last night  he reports having more than normal to drink.

## 2013-10-05 NOTE — ED Notes (Signed)
Pt aware of the need for a urine sample. Urinal at bedside. 

## 2013-10-05 NOTE — Transfer of Care (Signed)
Immediate Anesthesia Transfer of Care Note  Patient: Connor Haas  Procedure(s) Performed: Procedure(s) (LRB): APPENDECTOMY LAPAROSCOPIC (N/A)  Patient Location: PACU  Anesthesia Type: General  Level of Consciousness: sedated, patient cooperative and responds to stimulation  Airway & Oxygen Therapy: Patient Spontanous Breathing and Patient connected to face mask oxgen  Post-op Assessment: Report given to PACU RN and Post -op Vital signs reviewed and stable  Post vital signs: Reviewed and stable  Complications: No apparent anesthesia complications

## 2013-10-05 NOTE — ED Provider Notes (Signed)
4:16 PM Patient signed out to me by Jimmey Ralph, PA-C.  Connor Haas is a 57 y.o. year-old male with a past medical history of HTN, presenting the Emergency Department with a chief complaint of lower abdominal pain since 0500 today. He reports Right lower quadrant pain with associated vomiting. He describes the 9/10 discomfort as cramping. He reports one episode of non-bloody emesis today. The patient reports drinking 2 beers every night and a "few shots". Reports drinking several shots and 5-6 beers last night at a party. Denies hematuria or dysuria.  No colonoscopy.  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   Plan: CT abd/pel  5:24 PM CT remarkable for acute appendicitis.  Discussed with Dr. Daphine Deutscher from general surgery, who will see the patient.  Recommends INR and invanz.   Roxy Horseman, PA-C 10/05/13 1725

## 2013-10-05 NOTE — ED Provider Notes (Signed)
CSN: [id number]     Arrival date & time 10/05/13  1256 History   First MD Initiated Contact with Patient 10/05/13 1312     Chief Complaint  Patient presents with  . Abdominal Pain   (Consider location/radiation/quality/duration/timing/severity/associated sxs/prior Treatment) HPI Comments: Connor Haas is a 57 y.o. year-old male with a past medical history of HTN, presenting the Emergency Department with a chief complaint of lower abdominal pain since 0500 today.  He reports Right lower quadrant pain with associated vomiting.  He describes the 9/10 discomfort as cramping. He reports one episode of non-bloody emesis today. The patient reports drinking 2 beers every night and a "few shots".  Reports drinking several shots and 5-6 beers last night at a party. Denies history of kidney stones, hematuria, or dysuria. No history of abdominal surgeries or colonoscopy. PCP: Reports going to the Urgent Care.   Patient is a 57 y.o. male presenting with abdominal pain. The history is provided by the patient, medical records and the spouse. No language interpreter was used.  Abdominal Pain Associated symptoms: nausea and vomiting   Associated symptoms: no chills, no constipation, no cough, no diarrhea, no dysuria, no fever and no hematuria     Past Medical History  Diagnosis Date  . Hypertension    History reviewed. No pertinent past surgical history. Family History  Problem Relation Age of Onset  . CAD Father    History  Substance Use Topics  . Smoking status: Current Every Day Smoker -- 1.00 packs/day  . Smokeless tobacco: Not on file  . Alcohol Use: Yes     Comment: several every day    Review of Systems  Constitutional: Negative for fever and chills.  Respiratory: Negative for cough.   Gastrointestinal: Positive for nausea, vomiting and abdominal pain. Negative for diarrhea and constipation.  Genitourinary: Negative for dysuria, hematuria and flank pain.  Musculoskeletal: Positive for  back pain.  Skin: Negative for rash.    Allergies  Penicillins and Sertraline hcl  Home Medications   Current Outpatient Rx  Name  Route  Sig  Dispense  Refill  . ALPRAZolam (XANAX) 1 MG tablet   Oral   Take 0.5 mg by mouth 3 (three) times daily as needed for sleep. For anxiety         . aspirin 325 MG tablet   Oral   Take 650 mg by mouth daily.         Marland Kitchen aspirin EC 81 MG tablet   Oral   Take 81 mg by mouth daily.         Marland Kitchen MELATONIN PO   Oral   Take 1 tablet by mouth at bedtime. scheduled          BP 158/78  Pulse 92  Temp(Src) 98.9 F (37.2 C) (Oral)  Resp 16  Ht 6\' 1"  (1.854 m)  SpO2 93% Physical Exam  Nursing note and vitals reviewed. Constitutional: He is oriented to person, place, and time. He appears well-developed and well-nourished.  Appears uncomfortable  Neck: Neck supple.  Cardiovascular: Normal rate and regular rhythm.   Pulmonary/Chest: Effort normal and breath sounds normal. No respiratory distress. He has no wheezes. He has no rales.  Abdominal: Soft. Normal appearance and bowel sounds are normal. He exhibits no distension. There is tenderness in the right lower quadrant and suprapubic area. There is no rebound, no guarding, no CVA tenderness and negative Murphy's sign.    Negative psoas sign. Negative Rovsing's sign.  Neurological: He  is alert and oriented to person, place, and time.    ED Course  Procedures (including critical care time) Labs Review Labs Reviewed  CBC - Abnormal; Notable for the following:    WBC 14.1 (*)    All other components within normal limits  COMPREHENSIVE METABOLIC PANEL - Abnormal; Notable for the following:    Sodium 133 (*)    Glucose, Bld 130 (*)    AST 187 (*)    ALT 278 (*)    Alkaline Phosphatase 125 (*)    All other components within normal limits  URINALYSIS, ROUTINE W REFLEX MICROSCOPIC  LIPASE, BLOOD   Imaging Review No results found.  EKG Interpretation   None       MDM  No  diagnosis found. Pt with Right lower Q pain and suprapubic pain.  Will evaluate for kidney stone or possible diverticulitis. Labs , fluids, medication ordered. Discussed patient history, condition, and labs with Dr. Ethelda Chick who agrees on the current evaluation and advises ordering a CT prior to UA results. Re-eval patient reports pain improved to a 5/10 but then returned.  1620 Disscussed patient history and condition with Ivar Drape who will assume care of the patient at this time.  Clabe Seal, PA-C 10/06/13 2053  Clabe Seal, PA-C 10/06/13 2056

## 2013-10-05 NOTE — Preoperative (Signed)
Beta Blockers   Reason not to administer Beta Blockers:Not Applicable 

## 2013-10-05 NOTE — ED Notes (Signed)
Pt unable to void at this time. 

## 2013-10-05 NOTE — ED Notes (Signed)
Repositioned hand for adequate IV fluid Flow.

## 2013-10-05 NOTE — Anesthesia Preprocedure Evaluation (Signed)
Anesthesia Evaluation  Patient identified by MRN, date of birth, ID band Patient awake    Reviewed: Allergy & Precautions, H&P , NPO status , Patient's Chart, lab work & pertinent test results  Airway Mallampati: II TM Distance: >3 FB Neck ROM: Full    Dental  (+) Teeth Intact and Dental Advisory Given   Pulmonary Current Smoker,  breath sounds clear to auscultation  Pulmonary exam normal       Cardiovascular hypertension, Rhythm:Regular Rate:Normal     Neuro/Psych negative neurological ROS  negative psych ROS   GI/Hepatic negative GI ROS, (+)     substance abuse (ETOH abuse)  alcohol use,   Endo/Other  negative endocrine ROS  Renal/GU negative Renal ROS  negative genitourinary   Musculoskeletal negative musculoskeletal ROS (+)   Abdominal   Peds negative pediatric ROS (+)  Hematology negative hematology ROS (+)   Anesthesia Other Findings   Reproductive/Obstetrics                           Anesthesia Physical Anesthesia Plan  ASA: II and emergent  Anesthesia Plan: General   Post-op Pain Management:    Induction: Intravenous, Rapid sequence and Cricoid pressure planned  Airway Management Planned: Oral ETT  Additional Equipment:   Intra-op Plan:   Post-operative Plan: Extubation in OR  Informed Consent: I have reviewed the patients History and Physical, chart, labs and discussed the procedure including the risks, benefits and alternatives for the proposed anesthesia with the patient or authorized representative who has indicated his/her understanding and acceptance.   Dental advisory given  Plan Discussed with: CRNA  Anesthesia Plan Comments:         Anesthesia Quick Evaluation

## 2013-10-05 NOTE — ED Notes (Signed)
Pt transported to CT ?

## 2013-10-05 NOTE — ED Notes (Signed)
MD Jacobuwitz at bedside.

## 2013-10-05 NOTE — H&P (Signed)
Chief Complaint:  Acute appendicits  History of Present Illness:  Connor Haas is an 57 y.o. male who awoke with generalized abdominal pain today.  This has localized into the right lower quadrant and on CT he has an acute appendicitis.  No prior abdominal surgery.    Past Medical History  Diagnosis Date  . Hypertension     History reviewed. No pertinent past surgical history.  Current Facility-Administered Medications  Medication Dose Route Frequency Provider Last Rate Last Dose  . [START ON 10/06/2013] ertapenem (INVANZ) 1 g in sodium chloride 0.9 % 50 mL IVPB  1 g Intravenous On Call to OR Roxy Horseman, PA-C      . iohexol (OMNIPAQUE) 300 MG/ML solution 50 mL  50 mL Intravenous Once PRN Medication Radiologist, MD       Current Outpatient Prescriptions  Medication Sig Dispense Refill  . ALPRAZolam (XANAX) 1 MG tablet Take 0.5 mg by mouth 3 (three) times daily as needed for sleep. For anxiety      . aspirin 325 MG tablet Take 650 mg by mouth daily.      Marland Kitchen aspirin EC 81 MG tablet Take 81 mg by mouth daily.      Marland Kitchen MELATONIN PO Take 1 tablet by mouth at bedtime. scheduled       Penicillins and Sertraline hcl Family History  Problem Relation Age of Onset  . CAD Father    Social History:   reports that he has been smoking.  He does not have any smokeless tobacco history on file. He reports that he drinks alcohol. His drug history is not on file.   REVIEW OF SYSTEMS - PERTINENT POSITIVES ONLY:   Physical Exam:   Blood pressure 158/78, pulse 92, temperature 98.9 F (37.2 C), temperature source Oral, resp. rate 16, height 6\' 1"  (1.854 m), SpO2 93.00%. There is no weight on file to calculate BMI.  Gen:  WDWN WM NAD  Neurological: Alert and oriented to person, place, and time. Motor and sensory function is grossly intact  Head: Normocephalic and atraumatic.  Eyes: Conjunctivae are normal. Pupils are equal, round, and reactive to light. No scleral icterus.  Neck: Normal range of  motion. Neck supple. No tracheal deviation or thyromegaly present.  Cardiovascular:  SR without murmurs or gallops.  No carotid bruits Respiratory: Effort normal.  No respiratory distress. No chest wall tenderness. Breath sounds normal.  No wheezes, rales or rhonchi.  Abdomen:  Obese and mildly distended.  RLQ pain at McBurney's point.   GU: Musculoskeletal: Normal range of motion. Extremities are nontender. No cyanosis, edema or clubbing noted Lymphadenopathy: No cervical, preauricular, postauricular or axillary adenopathy is present Skin: Skin is warm and dry. No rash noted. No diaphoresis. No erythema. No pallor. Pscyh: Normal mood and affect. Behavior is normal. Judgment and thought content normal.   LABORATORY RESULTS: Results for orders placed during the hospital encounter of 10/05/13 (from the past 48 hour(s))  CBC     Status: Abnormal   Collection Time    10/05/13  1:37 PM      Result Value Range   WBC 14.1 (*) 4.0 - 10.5 K/uL   RBC 5.12  4.22 - 5.81 MIL/uL   Hemoglobin 15.9  13.0 - 17.0 g/dL   HCT 16.1  09.6 - 04.5 %   MCV 89.6  78.0 - 100.0 fL   MCH 31.1  26.0 - 34.0 pg   MCHC 34.6  30.0 - 36.0 g/dL   RDW 40.9  81.1 -  15.5 %   Platelets 179  150 - 400 K/uL  COMPREHENSIVE METABOLIC PANEL     Status: Abnormal   Collection Time    10/05/13  1:37 PM      Result Value Range   Sodium 133 (*) 135 - 145 mEq/L   Potassium 4.3  3.5 - 5.1 mEq/L   Chloride 97  96 - 112 mEq/L   CO2 21  19 - 32 mEq/L   Glucose, Bld 130 (*) 70 - 99 mg/dL   BUN 14  6 - 23 mg/dL   Creatinine, Ser 4.09  0.50 - 1.35 mg/dL   Calcium 9.8  8.4 - 81.1 mg/dL   Total Protein 7.7  6.0 - 8.3 g/dL   Albumin 4.0  3.5 - 5.2 g/dL   AST 914 (*) 0 - 37 U/L   Comment: SLIGHT HEMOLYSIS     HEMOLYSIS AT THIS LEVEL MAY AFFECT RESULT   ALT 278 (*) 0 - 53 U/L   Alkaline Phosphatase 125 (*) 39 - 117 U/L   Total Bilirubin 0.5  0.3 - 1.2 mg/dL   GFR calc non Af Amer >90  >90 mL/min   GFR calc Af Amer >90  >90 mL/min    Comment: (NOTE)     The eGFR has been calculated using the CKD EPI equation.     This calculation has not been validated in all clinical situations.     eGFR's persistently <90 mL/min signify possible Chronic Kidney     Disease.  LIPASE, BLOOD     Status: None   Collection Time    10/05/13  1:37 PM      Result Value Range   Lipase 17  11 - 59 U/L  URINALYSIS, ROUTINE W REFLEX MICROSCOPIC     Status: None   Collection Time    10/05/13  2:58 PM      Result Value Range   Color, Urine YELLOW  YELLOW   APPearance CLEAR  CLEAR   Specific Gravity, Urine 1.021  1.005 - 1.030   pH 6.5  5.0 - 8.0   Glucose, UA NEGATIVE  NEGATIVE mg/dL   Hgb urine dipstick NEGATIVE  NEGATIVE   Bilirubin Urine NEGATIVE  NEGATIVE   Ketones, ur NEGATIVE  NEGATIVE mg/dL   Protein, ur NEGATIVE  NEGATIVE mg/dL   Urobilinogen, UA 0.2  0.0 - 1.0 mg/dL   Nitrite NEGATIVE  NEGATIVE   Leukocytes, UA NEGATIVE  NEGATIVE   Comment: MICROSCOPIC NOT DONE ON URINES WITH NEGATIVE PROTEIN, BLOOD, LEUKOCYTES, NITRITE, OR GLUCOSE <1000 mg/dL.    RADIOLOGY RESULTS: Ct Abdomen Pelvis W Contrast  10/05/2013   CLINICAL DATA:  Right lower quadrant abdominal pain.  EXAM: CT ABDOMEN AND PELVIS WITH CONTRAST  TECHNIQUE: Multidetector CT imaging of the abdomen and pelvis was performed using the standard protocol following bolus administration of intravenous contrast.  CONTRAST:  OMNIPAQUE IOHEXOL 300 MG/ML  SOLN  COMPARISON:  None.  FINDINGS: The appendix is dilated and inflamed, measuring up to 14 mm in diameter. Inflammatory stranding is seen in the adjacent fat. There is no evidence of overt rupture. No focal abscess is identified. No visible appendicolith.  Other bowel is within normal limits without evidence of obstruction or inflammation. The liver shows mild and diffuse fatty infiltration. The gallbladder, pancreas, spleen, adrenal glands and kidneys are unremarkable. No incidental masses or enlarged lymph nodes are seen. There  is a small left inguinal hernia containing fat. The bladder is unremarkable. Bony structures show degenerative spondylosis  of the lumbar spine, particularly at the L4-5 and L5-S1.  IMPRESSION: Evidence of appendicitis with inflamed and dilated appendix measuring up to 14 mm in diameter. There is no evidence by CT of ruptured appendicitis or focal abscess.   Electronically Signed   By: Irish Lack M.D.   On: 10/05/2013 17:02    Problem List: Patient Active Problem List   Diagnosis Date Noted  . Acute appendicitis 10/05/2013  . Syncope 12/28/2012  . Tick bite 03/17/2011  . ALCOHOL ABUSE 04/24/2010  . NICOTINE ADDICTION 07/14/2007  . ANXIETY 04/21/2007  . BACK PAIN, CHRONIC, HX OF 04/21/2007    Assessment & Plan: Acute appendicitis I have explained laparoscopic and open appendectomy to him and his wife;  Will proceed with lap appendectomy.      Matt B. Daphine Deutscher, MD, [facility] [phone] beeper [phone]  10/05/2013 5:57 PM

## 2013-10-05 NOTE — ED Notes (Signed)
Pt finished with  Oral CT contrast

## 2013-10-06 ENCOUNTER — Telehealth (INDEPENDENT_AMBULATORY_CARE_PROVIDER_SITE_OTHER): Payer: Self-pay | Admitting: General Surgery

## 2013-10-06 ENCOUNTER — Encounter (HOSPITAL_COMMUNITY): Payer: Self-pay | Admitting: Surgery

## 2013-10-06 LAB — CBC
MCH: 30.4 pg (ref 26.0–34.0)
MCHC: 33.7 g/dL (ref 30.0–36.0)
MCV: 90.2 fL (ref 78.0–100.0)
Platelets: 158 10*3/uL (ref 150–400)
RBC: 4.51 MIL/uL (ref 4.22–5.81)
RDW: 13.2 % (ref 11.5–15.5)
WBC: 12.6 10*3/uL — ABNORMAL HIGH (ref 4.0–10.5)

## 2013-10-06 LAB — COMPREHENSIVE METABOLIC PANEL
ALT: 169 U/L — ABNORMAL HIGH (ref 0–53)
AST: 71 U/L — ABNORMAL HIGH (ref 0–37)
Albumin: 3 g/dL — ABNORMAL LOW (ref 3.5–5.2)
BUN: 13 mg/dL (ref 6–23)
Calcium: 8.6 mg/dL (ref 8.4–10.5)
Creatinine, Ser: 1.05 mg/dL (ref 0.50–1.35)
GFR calc non Af Amer: 77 mL/min — ABNORMAL LOW (ref >90)
Sodium: 134 mEq/L — ABNORMAL LOW (ref 135–145)
Total Protein: 6.3 g/dL (ref 6.0–8.3)

## 2013-10-06 MED ORDER — OXYCODONE-ACETAMINOPHEN 5-325 MG PO TABS: 1.0000 | ORAL_TABLET | ORAL | Status: DC | PRN

## 2013-10-06 MED ORDER — ALPRAZOLAM 0.5 MG PO TABS: 0.5000 mg | ORAL_TABLET | Freq: Three times a day (TID) | ORAL | Status: DC | PRN

## 2013-10-06 NOTE — Progress Notes (Signed)
Utilization review completed.  

## 2013-10-06 NOTE — Telephone Encounter (Signed)
Called pt back and informed him that after speaking with Dr. Jamey Ripa, he would like him to just double up on the Rx so that he is taking 1 mg TID and that he will need to call his PCP for the next refill any ways.

## 2013-10-06 NOTE — Progress Notes (Signed)
1 Day Post-Op   Assessment: s/p Procedure(s): APPENDECTOMY LAPAROSCOPIC Patient Active Problem List   Diagnosis Date Noted  . Acute appendicitis 10/05/2013  . Syncope 12/28/2012  . Tick bite 03/17/2011  . ALCOHOL ABUSE 04/24/2010  . NICOTINE ADDICTION 07/14/2007  . ANXIETY 04/21/2007  . BACK PAIN, CHRONIC, HX OF 04/21/2007    Doing well post op, able to go home  Plan: Discharge Will refill his xanax at discharge as he can't get up with his primary care for a few days  Subjective: Feels much better than pre-op, no nausea, ate liquid breakfast without a problem, anxious to go home  Objective: Vital signs in last 24 hours: Temp:  [97.7 F (36.5 C)-98.9 F (37.2 C)] 97.7 F (36.5 C) (12/26 0345) Pulse Rate:  [76-108] 86 (12/26 0345) Resp:  [12-23] 20 (12/26 0345) BP: (100-174)/(57-127) 100/57 mmHg (12/26 0345) SpO2:  [90 %-100 %] 95 % (12/26 0345)   Intake/Output from previous day: 12/25 0701 - 12/26 0700 In: 1000 [I.V.:1000] Out: 725 [Urine:725]  General appearance: alert, cooperative and no distress Resp: clear to auscultation bilaterally GI: Mildly distended, soft, tender at incisions, BS+  Incision: healing well  Lab Results:   Recent Labs  10/05/13 1337 10/06/13 0539  WBC 14.1* 12.6*  HGB 15.9 13.7  HCT 45.9 40.7  PLT 179 158   BMET  Recent Labs  10/05/13 1337 10/06/13 0539  NA 133* 134*  K 4.3 4.7  CL 97 101  CO2 21 23  GLUCOSE 130* 159*  BUN 14 13  CREATININE 0.83 1.05  CALCIUM 9.8 8.6    MEDS, Scheduled . heparin subcutaneous  5,000 Units Subcutaneous Q8H    Studies/Results: Ct Abdomen Pelvis W Contrast  10/05/2013   CLINICAL DATA:  Right lower quadrant abdominal pain.  EXAM: CT ABDOMEN AND PELVIS WITH CONTRAST  TECHNIQUE: Multidetector CT imaging of the abdomen and pelvis was performed using the standard protocol following bolus administration of intravenous contrast.  CONTRAST:  OMNIPAQUE IOHEXOL 300 MG/ML  SOLN   COMPARISON:  None.  FINDINGS: The appendix is dilated and inflamed, measuring up to 14 mm in diameter. Inflammatory stranding is seen in the adjacent fat. There is no evidence of overt rupture. No focal abscess is identified. No visible appendicolith.  Other bowel is within normal limits without evidence of obstruction or inflammation. The liver shows mild and diffuse fatty infiltration. The gallbladder, pancreas, spleen, adrenal glands and kidneys are unremarkable. No incidental masses or enlarged lymph nodes are seen. There is a small left inguinal hernia containing fat. The bladder is unremarkable. Bony structures show degenerative spondylosis of the lumbar spine, particularly at the L4-5 and L5-S1.  IMPRESSION: Evidence of appendicitis with inflamed and dilated appendix measuring up to 14 mm in diameter. There is no evidence by CT of ruptured appendicitis or focal abscess.   Electronically Signed   By: Irish Lack M.D.   On: 10/05/2013 17:02      LOS: 1 day     Currie Paris, MD, [facility], [place] [phone]   10/06/2013 9:01 AM

## 2013-10-06 NOTE — Telephone Encounter (Signed)
Patient called in wanting his xanax Rx fixed.  It was filled for .5 mg TID and his normal Rx is for 1 mg TID. I explained to him that I will send a message to Dr. Jamey Ripa and that someone will get in touch with him as soon as we get a response.

## 2013-10-07 ENCOUNTER — Telehealth (INDEPENDENT_AMBULATORY_CARE_PROVIDER_SITE_OTHER): Payer: Self-pay | Admitting: Surgery

## 2013-10-07 NOTE — Telephone Encounter (Signed)
Connor Haas had a lap appendectomy on 10/05/2013.  He developed a rash that may be coming from the Percocet.  He is going to stop the Percocet.  He will take some benadryl for the rash.  He is going to use NSAIDs for pain for the weekend.   If he is still hurting Monday, he will call to get an alternative narcotic.  D. [facility]

## 2013-10-08 NOTE — Anesthesia Postprocedure Evaluation (Signed)
Anesthesia Post Note  Patient: Connor Haas  Procedure(s) Performed: Procedure(s) (LRB): APPENDECTOMY LAPAROSCOPIC (N/A)  Anesthesia type: General  Patient location: PACU  Post pain: Pain level controlled  Post assessment: Post-op Vital signs reviewed  Last Vitals:  Filed Vitals:   10/06/13 0345  BP: 100/57  Pulse: 86  Temp: 36.5 C  Resp: 20    Post vital signs: Reviewed  Level of consciousness: sedated  Complications: No apparent anesthesia complications

## 2013-10-09 ENCOUNTER — Telehealth (INDEPENDENT_AMBULATORY_CARE_PROVIDER_SITE_OTHER): Payer: Self-pay

## 2013-10-09 ENCOUNTER — Encounter (HOSPITAL_COMMUNITY): Payer: Self-pay | Admitting: Surgery

## 2013-10-09 NOTE — Telephone Encounter (Addendum)
Patient states he is doing better the Nsaids has helped with his discomfort, rash is going away. P/O appt 10-18-13 12:45 with Dr. Ezzard Standing patient is aware

## 2013-10-09 NOTE — ED Provider Notes (Signed)
Medical screening examination/treatment/procedure(s) were conducted as a shared visit with non-physician practitioner(s) and myself.  I personally evaluated the patient during the encounter.  EKG Interpretation    Date/Time:    Ventricular Rate:    PR Interval:    QRS Duration:   QT Interval:    QTC Calculation:   R Axis:     Text Interpretation:               Doug Sou, MD 10/09/13 1454

## 2013-10-15 NOTE — Discharge Summary (Signed)
Physician Discharge Summary  Patient ID: Connor Haas MRN: [id number] DOB/AGE: [birth year] 58 y.o.  Admit date: 10/05/2013 Discharge date: 10/06/13  Admission Diagnoses:  Acute appendicitisw  Discharge Diagnoses:  same  Principal Problem:   Acute appendicitis   Surgery:  Laparoscopic appendectomy  Discharged Condition: improved  Hospital Course:   Had surgery.  Apparently did well.  Was not discharged by me.   Consults: none  Significant Diagnostic Studies: CT scan    Discharge Exam: Blood pressure 100/57, pulse 86, temperature 97.7 F (36.5 C), temperature source Oral, resp. rate 20, height 6\' 1"  (1.854 m), SpO2 95.00%. Incisions OK  Disposition: 01-Home or Self Care  Discharge Orders   Future Appointments Provider Department Dept Phone   10/18/2013 12:45 PM Shann Medal, MD [facility], [place] [phone]   Future Orders Complete By Expires   Diet - low sodium heart healthy  As directed    Discharge instructions  As directed    Comments:     [facility] LAPAROSCOPIC SURGERY: POST OP INSTRUCTIONS Always review your discharge instruction sheet given to you by the facility where your surgery was performed. IF YOU HAVE DISABILITY OR FAMILY LEAVE FORMS, YOU MUST BRING THEM TO THE OFFICE FOR PROCESSING.   DO NOT GIVE THEM TO YOUR DOCTOR.  A prescription for pain medication may be given to you upon discharge.  Take your pain medication as prescribed, if needed.  If narcotic pain medicine is not needed, then you may take acetaminophen (Tylenol) or ibuprofen (Advil) as needed. Take your usually prescribed medications unless otherwise directed. If you need a refill on your pain medication, please contact your pharmacy.  They will contact our office to request authorization. Prescriptions will not be filled after 5pm or on week-ends. You should follow a light diet the first few days after arrival home, such as soup and crackers, etc.  Be  sure to include lots of fluids daily. Most patients will experience some swelling and bruising in the area of the incisions.  Ice packs will help.  Swelling and bruising can take several days to resolve.  It is common to experience some constipation if taking pain medication after surgery.  Increasing fluid intake and taking a stool softener (such as Colace) will usually help or prevent this problem from occurring.  A mild laxative (Milk of Magnesia or Miralax) should be taken according to package instructions if there are no bowel movements after 48 hours. Unless discharge instructions indicate otherwise, you may remove your bandages 24-48 hours after surgery, and you may shower at that time.  You may have steri-strips (small skin tapes) in place directly over the incision.  These strips should be left on the skin for 7-10 days.  If your surgeon used skin glue on the incision, you may shower in 24 hours.  The glue will flake off over the next 2-3 weeks.  Any sutures or staples will be removed at the office during your follow-up visit. ACTIVITIES:  You may resume regular (light) daily activities beginning the next day-such as daily self-care, walking, climbing stairs-gradually increasing activities as tolerated.  You may have sexual intercourse when it is comfortable.  Refrain from any heavy lifting or straining until approved by your doctor. You may drive when you are no longer taking prescription pain medication, you can comfortably wear a seatbelt, and you can safely maneuver your car and apply brakes. RETURN TO WORK:  __________________________________________________________ Brylan Bast should see your doctor in the office  for a follow-up appointment approximately 2-3 weeks after your surgery.  Make sure that you call for this appointment within a day or two after you arrive home to insure a convenient appointment time. OTHER INSTRUCTIONS:  __________________________________________________________________________________________________________________________ __________________________________________________________________________________________________________________________ WHEN TO CALL YOUR DOCTOR: Fever over 101.0 Inability to urinate Continued bleeding from incision. Increased pain, redness, or drainage from the incision. Increasing abdominal pain  The clinic staff is available to answer your questions during regular business hours.  Please don't hesitate to call and ask to speak to one of the nurses for clinical concerns.  If you have a medical emergency, go to the nearest emergency room or call 911.  A surgeon from [facility] is always on call at the hospital. [address] [place], [place], [place]  [address] ? [address], [place], [place]   [address] [phone] ? [phone] ? FAX [phone] Web site: [email or web address]   Increase activity slowly  As directed    May shower / Bathe  As directed    Comments:     Starting tomorrow       Medication List         ALPRAZolam 0.5 MG tablet  Commonly known as:  XANAX  Take 1 tablet (0.5 mg total) by mouth 3 (three) times daily as needed for anxiety.     ALPRAZolam 1 MG tablet  Commonly known as:  XANAX  Take 0.5 mg by mouth 3 (three) times daily as needed for sleep. For anxiety     aspirin EC 81 MG tablet  Take 81 mg by mouth daily.     aspirin 325 MG tablet  Take 650 mg by mouth daily.     MELATONIN PO  Take 1 tablet by mouth at bedtime. scheduled     oxyCODONE-acetaminophen 5-325 MG per tablet  Commonly known as:  ROXICET  Take 1 tablet by mouth every 4 (four) hours as needed.           Follow-up Information   Follow up with [facility],MD, MD. Schedule an appointment as soon as possible for a visit in 2 weeks.   Specialty:  General Surgery   Contact information:   [address] [place] [address] [phone]       Signed: Pedro Earls 10/15/2013, 3:56 PM

## 2013-10-17 ENCOUNTER — Telehealth (INDEPENDENT_AMBULATORY_CARE_PROVIDER_SITE_OTHER): Payer: Self-pay

## 2013-10-17 NOTE — Telephone Encounter (Signed)
Patient called back and wants to see Dr. Hassell Done so appt was scheduled for 10/19/13 @ 1215p per message below.  Appt with Dr. Lucia Gaskins cancelled for 10/18/13

## 2013-10-17 NOTE — Telephone Encounter (Addendum)
After viewing Dr. Lucia Gaskins schedule I noticed said patient was on his schedule instead of Dr. Hassell Done who did his Lap. Appendectomy on 10-05-13. Patient stated he did not know if he could change his schedule at the last minute but he did not want to see the surgeon who did not do his surgery. I apologized for the inconvenience, and  offered to make his appointment with DR. Hassell Done on his next day in the office which was Thursday 10-19-13 @1215p . Patient stated he did not know at this time , he would call back  after 2 pm today after speaking to his wife because she would be coming with him ." He  will be  charging [place] for his inconvenience." Again I apologized and I  would be waiting for his call.

## 2013-10-18 ENCOUNTER — Encounter (INDEPENDENT_AMBULATORY_CARE_PROVIDER_SITE_OTHER): Payer: BC Managed Care – PPO | Admitting: Surgery

## 2013-10-19 ENCOUNTER — Ambulatory Visit (INDEPENDENT_AMBULATORY_CARE_PROVIDER_SITE_OTHER): Payer: BC Managed Care – PPO | Admitting: Surgery

## 2013-10-19 ENCOUNTER — Encounter (INDEPENDENT_AMBULATORY_CARE_PROVIDER_SITE_OTHER): Payer: Self-pay | Admitting: Surgery

## 2013-10-19 VITALS — BP 144/84 | HR 77 | Temp 98.6°F | Resp 18 | Ht 73.0 in | Wt 240.4 lb

## 2013-10-19 DIAGNOSIS — Z9889 Other specified postprocedural states: Secondary | ICD-10-CM

## 2013-10-19 DIAGNOSIS — Z9049 Acquired absence of other specified parts of digestive tract: Secondary | ICD-10-CM | POA: Insufficient documentation

## 2013-10-19 NOTE — Patient Instructions (Signed)
Take hydrocodone in the future if needed for pain

## 2013-10-19 NOTE — Progress Notes (Signed)
Connor Haas 58 y.o.  Body mass index is 31.72 kg/(m^2).  Patient Active Problem List   Diagnosis Date Noted  . Acute appendicitis 10/05/2013  . Syncope 12/28/2012  . Tick bite 03/17/2011  . ALCOHOL ABUSE 04/24/2010  . NICOTINE ADDICTION 07/14/2007  . ANXIETY 04/21/2007  . BACK PAIN, CHRONIC, HX OF 04/21/2007    Allergies  Allergen Reactions  . Penicillins Nausea Only    Sweating & a high fever  . Sertraline Hcl     REACTION: headache    Past Surgical History  Procedure Laterality Date  . Laparoscopic appendectomy N/A 10/05/2013    Procedure: APPENDECTOMY LAPAROSCOPIC;  Surgeon: Pedro Earls, MD;  Location: [facility];  Service: General;  Laterality: N/A;  . Appendectomy     [name], MD No diagnosis found.  Post lap appy on christmas day.  He has gone back to work.  Urged to use abdominal binder when lifting.  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   We talked about refraining from ETOH and going to [place].  He did have a rash with oxycodone  From my look at his liver, I think that he can turn things around if he stops drinking.  He is a functional alcoholic.   Return prn.   [name] Done, MD, [facility] [phone] beeper [phone]  10/19/2013 1:10 PM

## 2014-06-19 IMAGING — CT CT ABD-PELV W/ CM
1 of 3 series · 14 of 32 positions shown, 19 images · IV contrast (OMNIPAQUE 300)
Comparison: None.

CLINICAL DATA: Right lower quadrant abdominal pain.

EXAM:
CT ABDOMEN AND PELVIS WITH CONTRAST
TECHNIQUE: Multidetector CT imaging of the abdomen and pelvis was performed
using the standard protocol following bolus administration of
intravenous contrast.
CONTRAST:  100mL OMNIPAQUE IOHEXOL 300 MG/ML  SOLN

[Series 2: abd/pel with · axial · 0.98mm/px · z∈[+884,+1299]mm · 14 of 95 slices shown, 19 images]
[im 6/95  soft-tissue]
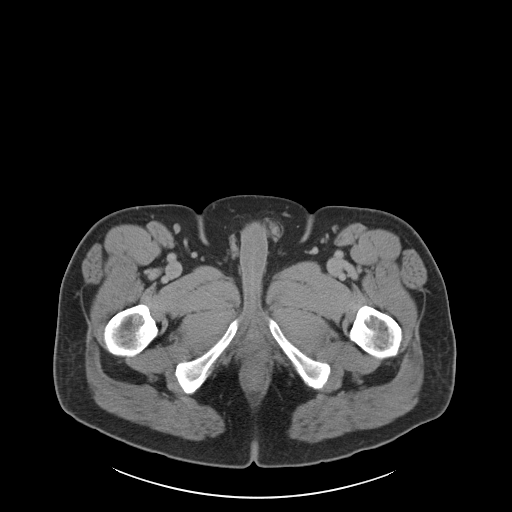
[im 6/95  bone]
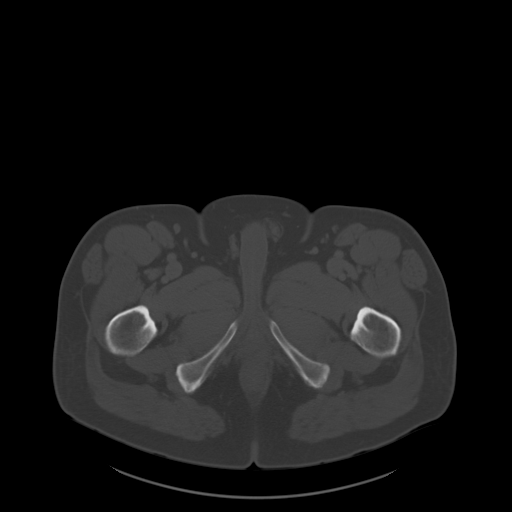
[im 12/95  soft-tissue]
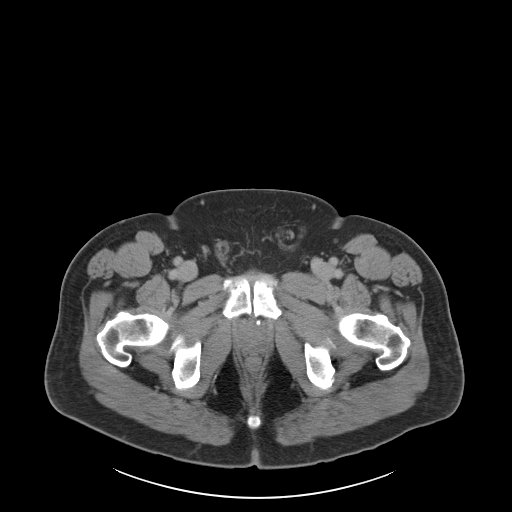
[im 23/95  soft-tissue]
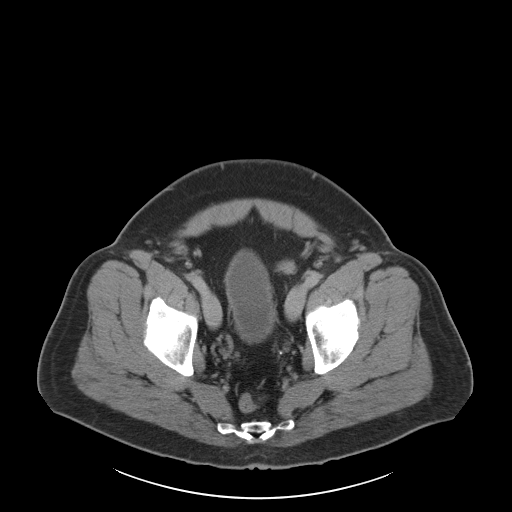
[im 28/95  soft-tissue]
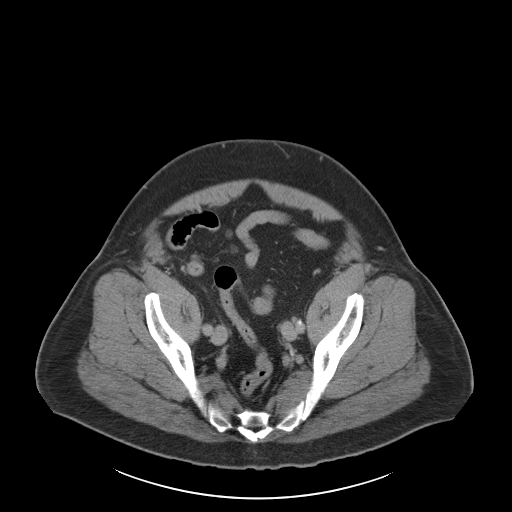
[im 34/95  soft-tissue]
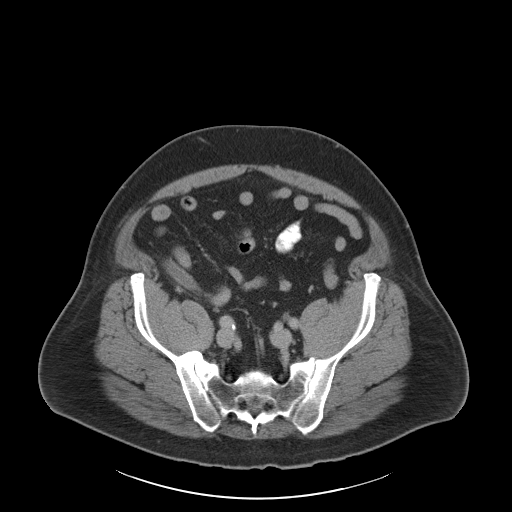
[im 39/95  soft-tissue]
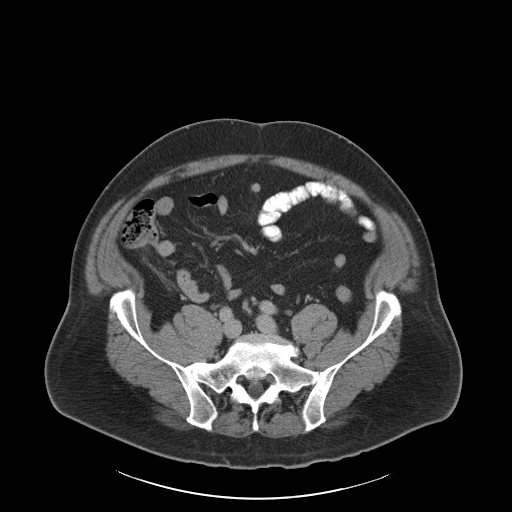
[im 50/95  soft-tissue]
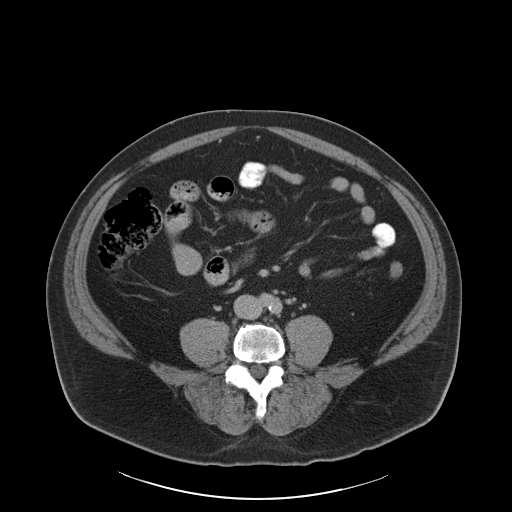
[im 56/95  soft-tissue]
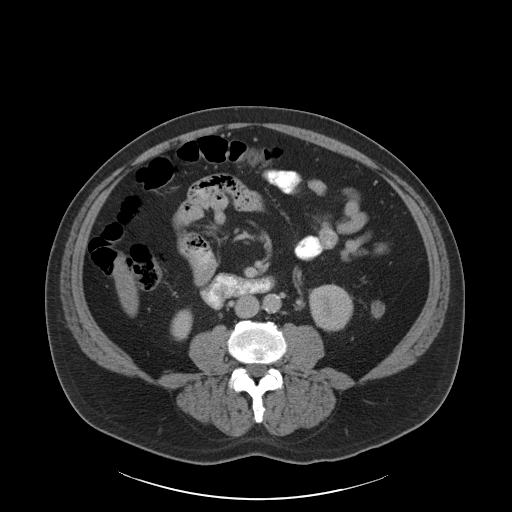
[im 61/95  soft-tissue]
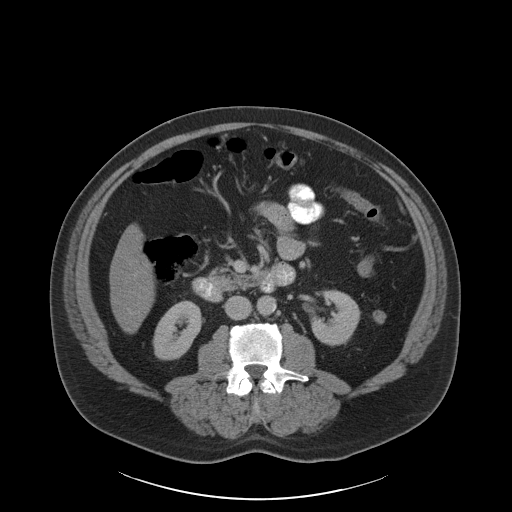
[im 61/95  bone]
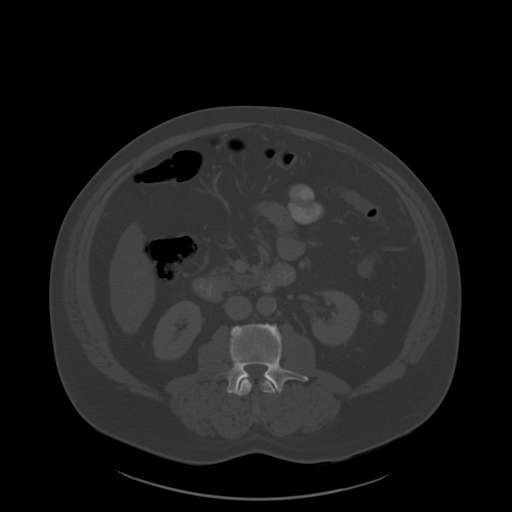
[im 67/95  soft-tissue]
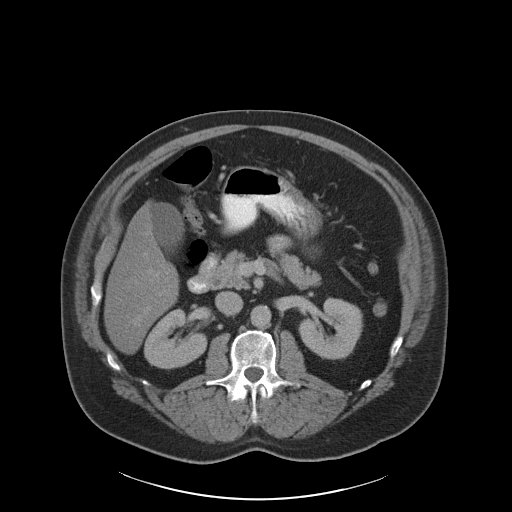
[im 72/95  soft-tissue]
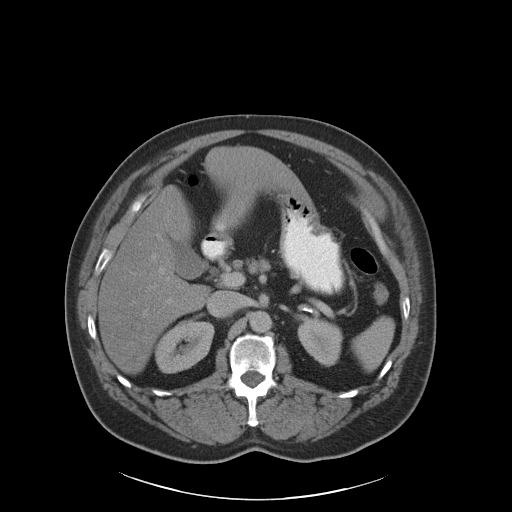
[im 72/95  lung]
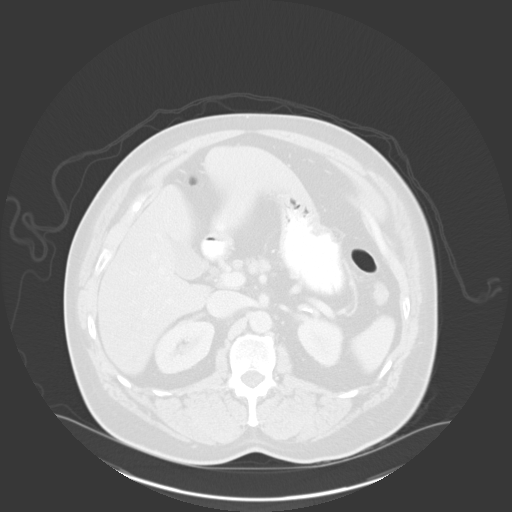
[im 78/95  lung]
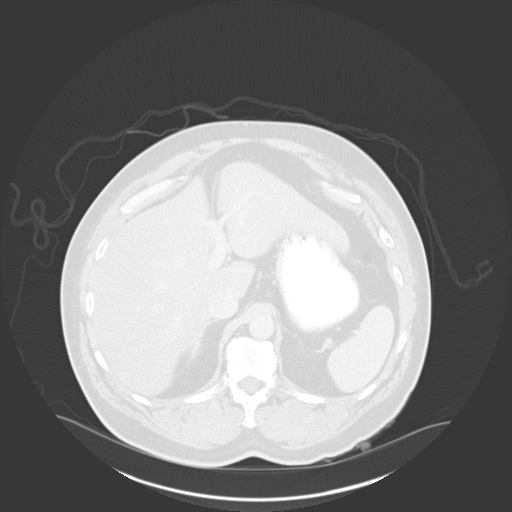
[im 83/95  soft-tissue]
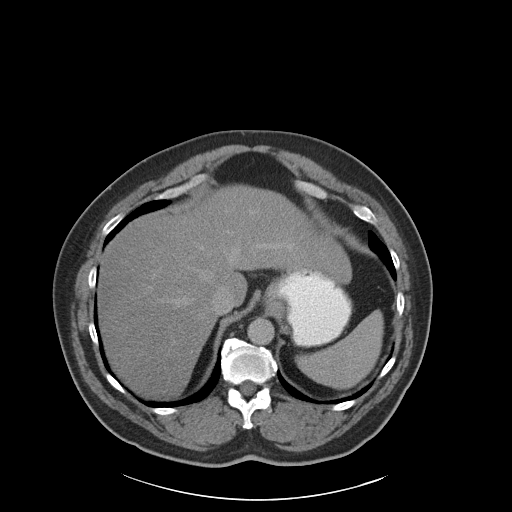
[im 83/95  lung]
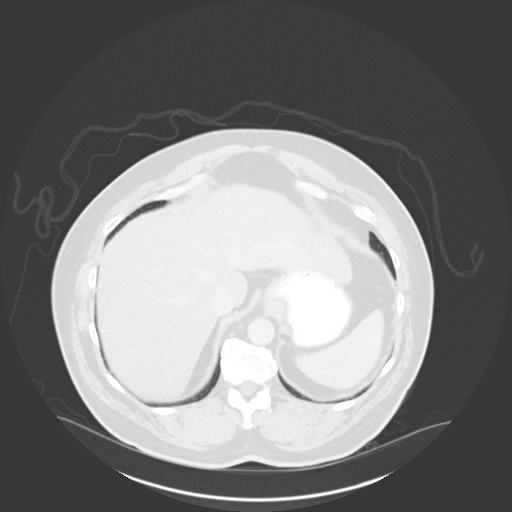
[im 89/95  soft-tissue]
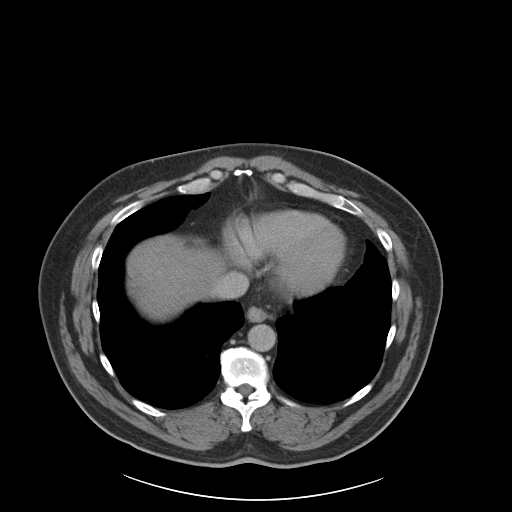
[im 89/95  lung]
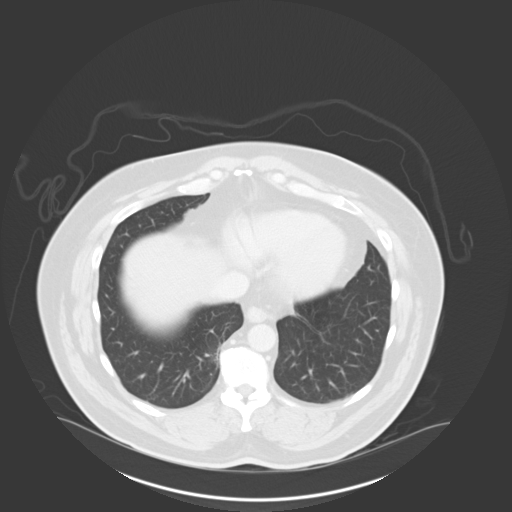

[14 of 32 positions shown; findings below may reference images not displayed]

FINDINGS: The appendix is dilated and inflamed, measuring up to 14 mm in
diameter. Inflammatory stranding is seen in the adjacent fat. There
is no evidence of overt rupture. No focal abscess is identified. No
visible appendicolith.

Other bowel is within normal limits without evidence of obstruction
or inflammation. The liver shows mild and diffuse fatty
infiltration. The gallbladder, pancreas, spleen, adrenal glands and
kidneys are unremarkable. No incidental masses or enlarged lymph
nodes are seen. There is a small left inguinal hernia containing
fat. The bladder is unremarkable. Bony structures show degenerative
spondylosis of the lumbar spine, particularly at the L4-5 and L5-S1.
IMPRESSION: Evidence of appendicitis with inflamed and dilated appendix
measuring up to 14 mm in diameter. There is no evidence by CT of
ruptured appendicitis or focal abscess.

## 2019-07-26 ENCOUNTER — Other Ambulatory Visit: Payer: Self-pay

## 2019-07-26 DIAGNOSIS — Z20822 Contact with and (suspected) exposure to covid-19: Secondary | ICD-10-CM

## 2019-07-27 LAB — NOVEL CORONAVIRUS, NAA: SARS-CoV-2, NAA: NOT DETECTED

## 2019-11-06 ENCOUNTER — Encounter: Payer: Self-pay | Admitting: Gastroenterology

## 2019-12-04 ENCOUNTER — Ambulatory Visit (INDEPENDENT_AMBULATORY_CARE_PROVIDER_SITE_OTHER): Payer: 59 | Admitting: Gastroenterology

## 2019-12-04 ENCOUNTER — Other Ambulatory Visit: Payer: Self-pay

## 2019-12-04 ENCOUNTER — Encounter: Payer: Self-pay | Admitting: Gastroenterology

## 2019-12-04 ENCOUNTER — Other Ambulatory Visit: Payer: 59

## 2019-12-04 VITALS — BP 120/64 | HR 68 | Temp 98.2°F | Ht 73.0 in | Wt 200.4 lb

## 2019-12-04 DIAGNOSIS — R634 Abnormal weight loss: Secondary | ICD-10-CM

## 2019-12-04 DIAGNOSIS — R197 Diarrhea, unspecified: Secondary | ICD-10-CM

## 2019-12-04 DIAGNOSIS — Z01818 Encounter for other preprocedural examination: Secondary | ICD-10-CM | POA: Diagnosis not present

## 2019-12-04 MED ORDER — NA SULFATE-K SULFATE-MG SULF 17.5-3.13-1.6 GM/177ML PO SOLN: 1.0000 | Freq: Once | ORAL | 0 refills | Status: AC

## 2019-12-04 NOTE — Progress Notes (Signed)
History of Present Illness: This is a 63 year old male referred by Everardo Beals, NP for the evaluation of diarrhea, weight loss, crampy abdominal pain.  He is accompanied by his wife.  He relates a 2-year history of frequent urgent diarrhea following meals and at other times often associated with crampy abdominal pain.  He relates a gradual weight loss of about 40 pounds over the past 2 years.  He states at times he has reduced his intake of food and beverages to reduce diarrhea.  He was recently started on Imodium with some improvement in diarrhea but with persistent abdominal cramping.  He had a recent episode of severe abdominal cramping on the commode temporarily passed out and sustained some mild facial abrasions.  CBC chemistry panel performed in January were unremarkable.  He states he might notice an increase in diarrhea with lactose products.  No family history of IBD, celiac disease.  Her maternal grandmother was diagnosed with colon cancer in her 29s.  He has not previously had colonoscopy. Denies weight loss, constipation,  change in stool caliber, melena, nausea, vomiting, dysphagia, reflux symptoms, chest pain.     Allergies  Allergen Reactions  . Oxycodone   . Penicillins   . Zoloft [Sertraline]     Headache   Outpatient Medications Prior to Visit  Medication Sig Dispense Refill  . ALPRAZolam (XANAX) 1 MG tablet Take 1 mg by mouth at bedtime as needed for anxiety.    Marland Kitchen lisinopril (ZESTRIL) 20 MG tablet Take 20 mg by mouth daily.    Marland Kitchen loperamide (IMODIUM A-D) 2 MG tablet Take 2 mg by mouth 4 (four) times daily as needed for diarrhea or loose stools.     No facility-administered medications prior to visit.   Past Medical History:  Diagnosis Date  . Anxiety   . Degenerative joint disease   . Depression   . GERD (gastroesophageal reflux disease)   . Hypertension   . Pneumonia     Social History   Socioeconomic History  . Marital status: Married    Spouse name:  Not on file  . Number of children: Not on file  . Years of education: Not on file  . Highest education level: Not on file  Occupational History  . Not on file  Tobacco Use  . Smoking status: Former Smoker    Quit date: 10/12/2012    Years since quitting: 7.1  . Smokeless tobacco: Never Used  Substance and Sexual Activity  . Alcohol use: Not Currently    Comment: Nothing since 2014  . Drug use: Never  . Sexual activity: Not on file  Other Topics Concern  . Not on file  Social History Narrative  . Not on file   Social Determinants of Health   Financial Resource Strain:   . Difficulty of Paying Living Expenses: Not on file  Food Insecurity:   . Worried About Charity fundraiser in the Last Year: Not on file  . Ran Out of Food in the Last Year: Not on file  Transportation Needs:   . Lack of Transportation (Medical): Not on file  . Lack of Transportation (Non-Medical): Not on file  Physical Activity:   . Days of Exercise per Week: Not on file  . Minutes of Exercise per Session: Not on file  Stress:   . Feeling of Stress : Not on file  Social Connections:   . Frequency of Communication with Friends and Family: Not on file  . Frequency of Social  Gatherings with Friends and Family: Not on file  . Attends Religious Services: Not on file  . Active Member of Clubs or Organizations: Not on file  . Attends Archivist Meetings: Not on file  . Marital Status: Not on file   Family History  Problem Relation Age of Onset  . Stroke Mother   . Diverticulitis Father   . Heart disease Father   . Colon cancer Maternal Grandmother   . Colon polyps Neg Hx       Review of Systems: Pertinent positive and negative review of systems were noted in the above HPI section. All other review of systems were otherwise negative.    Physical Exam: General: Well developed, well nourished, no acute distress Head: Normocephalic and atraumatic Eyes:  sclerae anicteric, EOMI Ears: Normal  auditory acuity Mouth: Not examined, mask on Neck: Supple, no masses or thyromegaly Lungs: Clear throughout to auscultation Heart: Regular rate and rhythm; no murmurs, rubs or bruits Abdomen: Soft, non tender and non distended. No masses, hepatosplenomegaly or hernias noted. Normal Bowel sounds Rectal: Deferred to colonoscopy Musculoskeletal: Symmetrical with no gross deformities  Skin: No lesions on visible extremities Pulses:  Normal pulses noted Extremities: No clubbing, cyanosis, edema or deformities noted Neurological: Alert oriented x 4, grossly nonfocal Cervical Nodes:  No significant cervical adenopathy Inguinal Nodes: No significant inguinal adenopathy Psychological:  Alert and cooperative. Normal mood and affect   Assessment and Recommendations:  1. Chronic diarrhea. Weight loss. Generalized abdominal cramping.  Rule out IBD, microscopic colitis, SIBO, colorectal neoplasms.  GI pathogen panel. TSH, tTG, IgA.  Lactose-free diet for 5 to 7 days and assess response.  He currently minimizes or avoids caffeine as it exacerbates his GI symptoms.  Continue Imodium 2 mg 4 times daily as needed.  Schedule colonoscopy. The risks (including bleeding, perforation, infection, missed lesions, medication reactions and possible hospitalization or surgery if complications occur), benefits, and alternatives to colonoscopy with possible biopsy and possible polypectomy were discussed with the patient and they consent to proceed.    cc: Everardo Beals, NP [address] [place],  [place] [address]

## 2019-12-04 NOTE — Patient Instructions (Signed)
Your provider has requested that you go to the basement level for lab work before leaving today. Press "B" on the elevator. The lab is located at the first door on the left as you exit the elevator.  Due to recent changes in healthcare laws, you may see the results of your imaging and laboratory studies on MyChart before your provider has had a chance to review them.  We understand that in some cases there may be results that are confusing or concerning to you. Not all laboratory results come back in the same time frame and the provider may be waiting for multiple results in order to interpret others.  Please give [place] 48 hours in order for your provider to thoroughly review all the results before contacting the office for clarification of your results.   Remain on lactose free diet x 7 days to see if this helps improve your symptoms.   You have been scheduled for a colonoscopy. Please follow written instructions given to you at your visit today.  Please pick up your prep supplies at the pharmacy within the next 1-3 days. If you use inhalers (even only as needed), please bring them with you on the day of your procedure.   Thank you for choosing me and [facility].  Pricilla Riffle. Dagoberto Ligas., MD., [facility]   Lactose-Free Diet, Adult If you have lactose intolerance, you are not able to digest lactose. Lactose is a natural sugar found mainly in dairy milk and dairy products. You may need to avoid all foods and beverages that contain lactose. A lactose-free diet can help you do this. Which foods have lactose? Lactose is found in dairy milk and dairy products, such as:  Yogurt.  Cheese.  Butter.  Margarine.  Sour cream.  Cream.  Whipped toppings and nondairy creamers.  Ice cream and other dairy-based desserts. Lactose is also found in foods or products made with dairy milk or milk ingredients. To find out whether a food contains dairy milk or a milk ingredient, look at the  ingredients list. Avoid foods with the statement "May contain milk" and foods that contain:  Milk powder.  Whey.  Curd.  Caseinate.  Lactose.  Lactalbumin.  Lactoglobulin. What are alternatives to dairy milk and foods made with milk products?  Lactose-free milk.  Soy milk with added calcium and vitamin D.  Almond milk, coconut milk, rice milk, or other nondairy milk alternatives with added calcium and vitamin D. Note that these are low in protein.  Soy products, such as soy yogurt, soy cheese, soy ice cream, and soy-based sour cream.  Other nut milk products, such as almond yogurt, almond cheese, cashew yogurt, cashew cheese, cashew ice cream, coconut yogurt, and coconut ice cream. What are tips for following this plan?  Do not consume foods, beverages, vitamins, minerals, or medicines containing lactose. Read ingredient lists carefully.  Look for the words "lactose-free" on labels.  Use lactase enzyme drops or tablets as directed by your health care provider.  Use lactose-free milk or a milk alternative, such as soy milk or almond milk, for drinking and cooking.  Make sure you get enough calcium and vitamin D in your diet. A lactose-free eating plan can be lacking in these important nutrients.  Take calcium and vitamin D supplements as directed by your health care provider. Talk to your health care provider about supplements if you are not able to get enough calcium and vitamin D from food. What foods can I eat?  Fruits All  fresh, canned, frozen, or dried fruits that are not processed with lactose. Vegetables All fresh, frozen, and canned vegetables without cheese, cream, or butter sauces. Grains Any that are not made with dairy milk or dairy products. Meats and other proteins Any meat, fish, poultry, and other protein sources that are not made with dairy milk or dairy products. Soy cheese and yogurt. Fats and oils Any that are not made with dairy milk or dairy  products. Beverages Lactose-free milk. Soy, rice, or almond milk with added calcium and vitamin D. Fruit and vegetable juices. Sweets and desserts Any that are not made with dairy milk or dairy products. Seasonings and condiments Any that are not made with dairy milk or dairy products. Calcium Calcium is found in many foods that contain lactose and is important for bone health. The amount of calcium you need depends on your age:  Adults younger than 50 years: 1,000 mg of calcium a day.  Adults older than 50 years: 1,200 mg of calcium a day. If you are not getting enough calcium, you may get it from other sources, including:  Orange juice with calcium added. There are 300-350 mg of calcium in 1 cup of orange juice.  Calcium-fortified soy milk. There are 300-400 mg of calcium in 1 cup of calcium-fortified soy milk.  Calcium-fortified rice or almond milk. There are 300 mg of calcium in 1 cup of calcium-fortified rice or almond milk.  Calcium-fortified breakfast cereals. There are 100-1,000 mg of calcium in calcium-fortified breakfast cereals.  Spinach, cooked. There are 145 mg of calcium in  cup of cooked spinach.  Edamame, cooked. There are 130 mg of calcium in  cup of cooked edamame.  Collard greens, cooked. There are 125 mg of calcium in  cup of cooked collard greens.  Kale, frozen or cooked. There are 90 mg of calcium in  cup of cooked or frozen kale.  Almonds. There are 95 mg of calcium in  cup of almonds.  Broccoli, cooked. There are 60 mg of calcium in 1 cup of cooked broccoli. The items listed above may not be a complete list of recommended foods and beverages. Contact a dietitian for more options. What foods are not recommended? Fruits None, unless they are made with dairy milk or dairy products. Vegetables None, unless they are made with dairy milk or dairy products. Grains Any grains that are made with dairy milk or dairy products. Meats and other  proteins None, unless they are made with dairy milk or dairy products. Dairy All dairy products, including milk, goat's milk, buttermilk, kefir, acidophilus milk, flavored milk, evaporated milk, condensed milk, dulce de [place], eggnog, yogurt, cheese, and cheese spreads. Fats and oils Any that are made with milk or milk products. Margarines and salad dressings that contain milk or cheese. Cream. Half and half. Cream cheese. Sour cream. Chip dips made with sour cream or yogurt. Beverages Hot chocolate. Cocoa with lactose. Instant iced teas. Powdered fruit drinks. Smoothies made with dairy milk or yogurt. Sweets and desserts Any that are made with milk or milk products. Seasonings and condiments Chewing gum that has lactose. Spice blends if they contain lactose. Artificial sweeteners that contain lactose. Nondairy creamers. The items listed above may not be a complete list of foods and beverages to avoid. Contact a dietitian for more information. Summary  If you are lactose intolerant, it means that you have a hard time digesting lactose, a natural sugar found in milk and milk products.  Following a lactose-free diet  can help you manage this condition.  Calcium is important for bone health and is found in many foods that contain lactose. Talk with your health care provider about other sources of calcium. This information is not intended to replace advice given to you by your health care provider. Make sure you discuss any questions you have with your health care provider. Document Revised: 10/26/2017 Document Reviewed: 10/26/2017 Elsevier Patient Education  2020 [employer].

## 2019-12-05 LAB — TISSUE TRANSGLUTAMINASE, IGA: (tTG) Ab, IgA: 1 U/mL

## 2019-12-06 ENCOUNTER — Encounter (INDEPENDENT_AMBULATORY_CARE_PROVIDER_SITE_OTHER): Payer: Self-pay | Admitting: Surgery

## 2019-12-11 ENCOUNTER — Other Ambulatory Visit: Payer: Self-pay

## 2019-12-11 DIAGNOSIS — R634 Abnormal weight loss: Secondary | ICD-10-CM

## 2019-12-11 DIAGNOSIS — R197 Diarrhea, unspecified: Secondary | ICD-10-CM

## 2019-12-12 LAB — GASTROINTESTINAL PATHOGEN PANEL PCR
C. difficile Tox A/B, PCR: NOT DETECTED
Campylobacter, PCR: NOT DETECTED
Cryptosporidium, PCR: NOT DETECTED
E coli (ETEC) LT/ST PCR: NOT DETECTED
E coli (STEC) stx1/stx2, PCR: NOT DETECTED
E coli 0157, PCR: NOT DETECTED
Giardia lamblia, PCR: NOT DETECTED
Norovirus, PCR: NOT DETECTED
Rotavirus A, PCR: NOT DETECTED
Salmonella, PCR: NOT DETECTED
Shigella, PCR: NOT DETECTED

## 2019-12-14 ENCOUNTER — Telehealth: Payer: Self-pay | Admitting: Gastroenterology

## 2019-12-14 NOTE — Telephone Encounter (Signed)
Left a message for patient informing him we sent a discount code along with the prescription when we sent the prescription to the pharmacy. Also informed him I called the pharmacy and told them to run the discount code and now his prescription for Suprep is $50 but to call back if he has any other questions.

## 2019-12-15 ENCOUNTER — Ambulatory Visit (INDEPENDENT_AMBULATORY_CARE_PROVIDER_SITE_OTHER): Payer: 59

## 2019-12-15 ENCOUNTER — Encounter: Payer: Self-pay | Admitting: Gastroenterology

## 2019-12-15 ENCOUNTER — Other Ambulatory Visit: Payer: Self-pay | Admitting: Gastroenterology

## 2019-12-15 DIAGNOSIS — Z1159 Encounter for screening for other viral diseases: Secondary | ICD-10-CM

## 2019-12-16 LAB — SARS CORONAVIRUS 2 (TAT 6-24 HRS): SARS Coronavirus 2: NEGATIVE

## 2019-12-19 ENCOUNTER — Ambulatory Visit (AMBULATORY_SURGERY_CENTER): Payer: 59 | Admitting: Gastroenterology

## 2019-12-19 ENCOUNTER — Other Ambulatory Visit: Payer: Self-pay

## 2019-12-19 ENCOUNTER — Encounter: Payer: Self-pay | Admitting: Gastroenterology

## 2019-12-19 VITALS — BP 104/54 | HR 63 | Temp 97.1°F | Resp 12 | Ht 73.0 in | Wt 200.0 lb

## 2019-12-19 DIAGNOSIS — K5730 Diverticulosis of large intestine without perforation or abscess without bleeding: Secondary | ICD-10-CM | POA: Diagnosis not present

## 2019-12-19 DIAGNOSIS — D128 Benign neoplasm of rectum: Secondary | ICD-10-CM

## 2019-12-19 DIAGNOSIS — D125 Benign neoplasm of sigmoid colon: Secondary | ICD-10-CM | POA: Diagnosis not present

## 2019-12-19 DIAGNOSIS — R197 Diarrhea, unspecified: Secondary | ICD-10-CM

## 2019-12-19 DIAGNOSIS — D124 Benign neoplasm of descending colon: Secondary | ICD-10-CM | POA: Diagnosis not present

## 2019-12-19 DIAGNOSIS — K640 First degree hemorrhoids: Secondary | ICD-10-CM | POA: Diagnosis not present

## 2019-12-19 DIAGNOSIS — R634 Abnormal weight loss: Secondary | ICD-10-CM

## 2019-12-19 MED ORDER — SODIUM CHLORIDE 0.9 % IV SOLN: 500.0000 mL | Freq: Once | INTRAVENOUS | Status: DC

## 2019-12-19 NOTE — Op Note (Signed)
[facility] Patient Name: Connor Haas Procedure Date: 12/19/2019 2:17 PM MRN: [id number] Endoscopist: Ladene Artist , MD Age: 64 Referring MD:  Date of Birth: 07-25-1956 Gender: Male Account #: [id number] Procedure:                Colonoscopy Indications:              Clinically significant diarrhea of unexplained                            origin, Weight loss Medicines:                Monitored Anesthesia Care Procedure:                Pre-Anesthesia Assessment:                           - Prior to the procedure, a History and Physical                            was performed, and patient medications and                            allergies were reviewed. The patient's tolerance of                            previous anesthesia was also reviewed. The risks                            and benefits of the procedure and the sedation                            options and risks were discussed with the patient.                            All questions were answered, and informed consent                            was obtained. Prior Anticoagulants: The patient has                            taken no previous anticoagulant or antiplatelet                            agents. ASA Grade Assessment: II - A patient with                            mild systemic disease. After reviewing the risks                            and benefits, the patient was deemed in                            satisfactory condition to undergo the procedure.  After obtaining informed consent, the colonoscope                            was passed under direct vision. Throughout the                            procedure, the patient's blood pressure, pulse, and                            oxygen saturations were monitored continuously. The                            Colonoscope was introduced through the anus and                            advanced to the the terminal ileum, with                       identification of the appendiceal orifice and IC                            valve. The terminal ileum, ileocecal valve,                            appendiceal orifice, and rectum were photographed.                            The quality of the bowel preparation was excellent.                            The colonoscopy was performed without difficulty.                            The patient tolerated the procedure well. Scope In: 2:27:08 PM Scope Out: 2:45:09 PM Scope Withdrawal Time: 0 hours 13 minutes 30 seconds  Total Procedure Duration: 0 hours 18 minutes 1 second  Findings:                 The perianal and digital rectal examinations were                            normal.                           The terminal ileum appeared normal.                           Two pedunculated polyps were found in the sigmoid                            colon and descending colon. The polyps were 11 to                            13 mm in size. These polyps were removed with a hot  snare. Resection and retrieval were complete.                           A 5 mm polyp was found in the rectum. The polyp was                            sessile. The polyp was removed with a cold snare.                            Resection and retrieval were complete.                           Multiple small-mouthed diverticula were found in                            the left colon. There was no evidence of                            diverticular bleeding.                           Internal hemorrhoids were found during                            retroflexion. The hemorrhoids were small and Grade                            I (internal hemorrhoids that do not prolapse).                           The exam was otherwise without abnormality on                            direct and retroflexion views. Random biopsies                            obtained. Complications:            No  immediate complications. Estimated blood loss:                            None. Estimated Blood Loss:     Estimated blood loss: none. Impression:               - The examined portion of the ileum was normal.                           - Two 11 to 13 mm polyps in the sigmoid colon and                            in the descending colon, removed with a hot snare.                            Resected and retrieved.                           -  One 5 mm polyp in the rectum, removed with a cold                            snare. Resected and retrieved.                           - Mild diverticulosis in the left colon.                           - Internal hemorrhoids.                           - The examination was otherwise normal on direct                            and retroflexion views. Recommendation:           - Repeat colonoscopy after studies are complete for                            surveillance based on pathology results.                           - Patient has a contact number available for                            emergencies. The signs and symptoms of potential                            delayed complications were discussed with the                            patient. Return to normal activities tomorrow.                            Written discharge instructions were provided to the                            patient.                           - Resume previous diet.                           - Continue present medications.                           - Await pathology results.                           - Return to GI office in 1 month.                           - No aspirin, ibuprofen, naproxen, or other                            non-steroidal  anti-inflammatory drugs for 2 weeks                            after polyp removal. Ladene Artist, MD 12/19/2019 2:55:03 PM This report has been signed electronically.

## 2019-12-19 NOTE — Patient Instructions (Addendum)
Handouts given for polyps, hemorrhoids and diverticulosis.  No NSAIDS (Non-Steroidal anti-inflammatory drugs) for 2 weeks.  (These include, aspirin, aspirin-containing products, ibuprofen, advil, motrin, naproxen, aleve, goody powders, etc)  Call your PCP re: your blood pressure today, 80/54-100/54.  YOU HAD AN ENDOSCOPIC PROCEDURE TODAY AT [facility]:   Refer to the procedure report that was given to you for any specific questions about what was found during the examination.  If the procedure report does not answer your questions, please call your gastroenterologist to clarify.  If you requested that your care partner not be given the details of your procedure findings, then the procedure report has been included in a sealed envelope for you to review at your convenience later.  YOU SHOULD EXPECT: Some feelings of bloating in the abdomen. Passage of more gas than usual.  Walking can help get rid of the air that was put into your GI tract during the procedure and reduce the bloating. If you had a lower endoscopy (such as a colonoscopy or flexible sigmoidoscopy) you may notice spotting of blood in your stool or on the toilet paper. If you underwent a bowel prep for your procedure, you may not have a normal bowel movement for a few days.  Please Note:  You might notice some irritation and congestion in your nose or some drainage.  This is from the oxygen used during your procedure.  There is no need for concern and it should clear up in a day or so.  SYMPTOMS TO REPORT IMMEDIATELY:   Following lower endoscopy (colonoscopy or flexible sigmoidoscopy):  Excessive amounts of blood in the stool  Significant tenderness or worsening of abdominal pains  Swelling of the abdomen that is new, acute  Fever of 100F or higher  For urgent or emergent issues, a gastroenterologist can be reached at any hour by calling [phone]. Do not use MyChart messaging for urgent concerns.    DIET:   We do recommend a small meal at first, but then you may proceed to your regular diet.  Drink plenty of fluids but you should avoid alcoholic beverages for 24 hours.  ACTIVITY:  You should plan to take it easy for the rest of today and you should NOT DRIVE or use heavy machinery until tomorrow (because of the sedation medicines used during the test).    FOLLOW UP: Our staff will call the number listed on your records 48-72 hours following your procedure to check on you and address any questions or concerns that you may have regarding the information given to you following your procedure. If we do not reach you, we will leave a message.  We will attempt to reach you two times.  During this call, we will ask if you have developed any symptoms of COVID 19. If you develop any symptoms (ie: fever, flu-like symptoms, shortness of breath, cough etc.) before then, please call [phone].  If you test positive for Covid 19 in the 2 weeks post procedure, please call and report this information to [place].    If any biopsies were taken you will be contacted by phone or by letter within the next 1-3 weeks.  Please call us at [phone] if you have not heard about the biopsies in 3 weeks.    SIGNATURES/CONFIDENTIALITY: You and/or your care partner have signed paperwork which will be entered into your electronic medical record.  These signatures attest to the fact that that the information above on your After Visit Summary  has been reviewed and is understood.  Full responsibility of the confidentiality of this discharge information lies with you and/or your care-partner. 

## 2019-12-19 NOTE — Progress Notes (Signed)
PT taken to PACU. Monitors in place. VSS. Report given to RN. 

## 2019-12-19 NOTE — Progress Notes (Signed)
Temp- June Vitals- Donna  Pt's states no medical or surgical changes since previsit or office visit. 

## 2019-12-19 NOTE — Progress Notes (Signed)
Called to room to assist during endoscopic procedure.  Patient ID and intended procedure confirmed with present staff. Received instructions for my participation in the procedure from the performing physician.  

## 2019-12-21 ENCOUNTER — Telehealth: Payer: Self-pay

## 2019-12-21 NOTE — Telephone Encounter (Signed)
  Follow up Call-  Call back number 12/19/2019  Post procedure Call Back phone  # [phone]  Permission to leave phone message Yes  Some recent data might be hidden     Patient questions:  Do you have a fever, pain , or abdominal swelling? No. Pain Score  0 *  Have you tolerated food without any problems? Yes.    Have you been able to return to your normal activities? Yes.    Do you have any questions about your discharge instructions: Diet   No. Medications  No. Follow up visit  No.  Do you have questions or concerns about your Care? No.  Actions: * If pain score is 4 or above: No action needed, pain <4. 1. Have you developed a fever since your procedure? no  2.   Have you had an respiratory symptoms (SOB or cough) since your procedure? no  3.   Have you tested positive for COVID 19 since your procedure no  4.   Have you had any family members/close contacts diagnosed with the COVID 19 since your procedure?  no   If yes to any of these questions please route to Joylene John, RN and Alphonsa Gin, Therapist, sports.

## 2019-12-28 ENCOUNTER — Encounter: Payer: Self-pay | Admitting: Gastroenterology

## 2020-03-01 ENCOUNTER — Encounter: Payer: Self-pay | Admitting: Gastroenterology

## 2022-12-01 ENCOUNTER — Encounter: Payer: Self-pay | Admitting: Gastroenterology

## 2023-05-01 ENCOUNTER — Encounter (HOSPITAL_COMMUNITY): Payer: Self-pay

## 2023-05-01 ENCOUNTER — Inpatient Hospital Stay (HOSPITAL_COMMUNITY)
Admission: EM | Admit: 2023-05-01 | Discharge: 2023-05-03 | DRG: 322 | Disposition: A | Discharge status: Home / Self Care | Payer: Medicare Other | Attending: Internal Medicine | Admitting: Internal Medicine

## 2023-05-01 ENCOUNTER — Emergency Department (HOSPITAL_COMMUNITY): Payer: Medicare Other

## 2023-05-01 ENCOUNTER — Encounter (HOSPITAL_COMMUNITY)
Admission: EM | Disposition: A | Discharge status: Home / Self Care | Payer: Self-pay | Source: Home / Self Care | Attending: Internal Medicine

## 2023-05-01 ENCOUNTER — Other Ambulatory Visit: Payer: Self-pay

## 2023-05-01 DIAGNOSIS — I255 Ischemic cardiomyopathy: Secondary | ICD-10-CM | POA: Diagnosis present

## 2023-05-01 DIAGNOSIS — I10 Essential (primary) hypertension: Secondary | ICD-10-CM | POA: Insufficient documentation

## 2023-05-01 DIAGNOSIS — I472 Ventricular tachycardia, unspecified: Secondary | ICD-10-CM | POA: Diagnosis not present

## 2023-05-01 DIAGNOSIS — N179 Acute kidney failure, unspecified: Secondary | ICD-10-CM | POA: Diagnosis present

## 2023-05-01 DIAGNOSIS — Z888 Allergy status to other drugs, medicaments and biological substances status: Secondary | ICD-10-CM

## 2023-05-01 DIAGNOSIS — I2510 Atherosclerotic heart disease of native coronary artery without angina pectoris: Secondary | ICD-10-CM | POA: Diagnosis not present

## 2023-05-01 DIAGNOSIS — I4510 Unspecified right bundle-branch block: Secondary | ICD-10-CM | POA: Diagnosis present

## 2023-05-01 DIAGNOSIS — I2583 Coronary atherosclerosis due to lipid rich plaque: Secondary | ICD-10-CM | POA: Diagnosis present

## 2023-05-01 DIAGNOSIS — E739 Lactose intolerance, unspecified: Secondary | ICD-10-CM | POA: Diagnosis present

## 2023-05-01 DIAGNOSIS — I110 Hypertensive heart disease with heart failure: Secondary | ICD-10-CM | POA: Diagnosis present

## 2023-05-01 DIAGNOSIS — F419 Anxiety disorder, unspecified: Secondary | ICD-10-CM | POA: Diagnosis present

## 2023-05-01 DIAGNOSIS — Z8249 Family history of ischemic heart disease and other diseases of the circulatory system: Secondary | ICD-10-CM

## 2023-05-01 DIAGNOSIS — Z79899 Other long term (current) drug therapy: Secondary | ICD-10-CM | POA: Diagnosis not present

## 2023-05-01 DIAGNOSIS — E781 Pure hyperglyceridemia: Secondary | ICD-10-CM | POA: Diagnosis present

## 2023-05-01 DIAGNOSIS — Z7982 Long term (current) use of aspirin: Secondary | ICD-10-CM

## 2023-05-01 DIAGNOSIS — Z87891 Personal history of nicotine dependence: Secondary | ICD-10-CM

## 2023-05-01 DIAGNOSIS — K219 Gastro-esophageal reflux disease without esophagitis: Secondary | ICD-10-CM | POA: Diagnosis present

## 2023-05-01 DIAGNOSIS — E7800 Pure hypercholesterolemia, unspecified: Secondary | ICD-10-CM | POA: Diagnosis present

## 2023-05-01 DIAGNOSIS — I214 Non-ST elevation (NSTEMI) myocardial infarction: Secondary | ICD-10-CM | POA: Diagnosis not present

## 2023-05-01 DIAGNOSIS — Z885 Allergy status to narcotic agent status: Secondary | ICD-10-CM

## 2023-05-01 DIAGNOSIS — Z955 Presence of coronary angioplasty implant and graft: Secondary | ICD-10-CM

## 2023-05-01 DIAGNOSIS — I5022 Chronic systolic (congestive) heart failure: Secondary | ICD-10-CM | POA: Diagnosis present

## 2023-05-01 DIAGNOSIS — G8929 Other chronic pain: Secondary | ICD-10-CM | POA: Diagnosis present

## 2023-05-01 DIAGNOSIS — E785 Hyperlipidemia, unspecified: Secondary | ICD-10-CM

## 2023-05-01 DIAGNOSIS — R7303 Prediabetes: Secondary | ICD-10-CM | POA: Diagnosis present

## 2023-05-01 DIAGNOSIS — Z800 Family history of malignant neoplasm of digestive organs: Secondary | ICD-10-CM | POA: Diagnosis not present

## 2023-05-01 DIAGNOSIS — Z823 Family history of stroke: Secondary | ICD-10-CM

## 2023-05-01 DIAGNOSIS — Z880 Allergy status to penicillin: Secondary | ICD-10-CM | POA: Diagnosis not present

## 2023-05-01 DIAGNOSIS — I519 Heart disease, unspecified: Secondary | ICD-10-CM | POA: Diagnosis not present

## 2023-05-01 DIAGNOSIS — E782 Mixed hyperlipidemia: Secondary | ICD-10-CM | POA: Diagnosis not present

## 2023-05-01 HISTORY — DX: Other chronic pain: G89.29

## 2023-05-01 HISTORY — PX: CORONARY STENT INTERVENTION: CATH118234

## 2023-05-01 HISTORY — PX: LEFT HEART CATH AND CORONARY ANGIOGRAPHY: CATH118249

## 2023-05-01 HISTORY — DX: Hyperlipidemia, unspecified: E78.5

## 2023-05-01 LAB — CBC WITH DIFFERENTIAL/PLATELET
Abs Immature Granulocytes: 0.05 10*3/uL (ref 0.00–0.07)
Basophils Absolute: 0.1 10*3/uL (ref 0.0–0.1)
Basophils Relative: 1 %
Eosinophils Absolute: 0.4 10*3/uL (ref 0.0–0.5)
Eosinophils Relative: 4 %
HCT: 44.3 % (ref 39.0–52.0)
Hemoglobin: 14.9 g/dL (ref 13.0–17.0)
Immature Granulocytes: 1 %
Lymphocytes Relative: 20 %
Lymphs Abs: 2 10*3/uL (ref 0.7–4.0)
MCH: 30 pg (ref 26.0–34.0)
MCHC: 33.6 g/dL (ref 30.0–36.0)
MCV: 89.3 fL (ref 80.0–100.0)
Monocytes Absolute: 0.7 10*3/uL (ref 0.1–1.0)
Monocytes Relative: 7 %
Neutro Abs: 6.6 10*3/uL (ref 1.7–7.7)
Neutrophils Relative %: 67 %
Platelets: 211 10*3/uL (ref 150–400)
RBC: 4.96 MIL/uL (ref 4.22–5.81)
RDW: 13.2 % (ref 11.5–15.5)
WBC: 9.9 10*3/uL (ref 4.0–10.5)
nRBC: 0 % (ref 0.0–0.2)

## 2023-05-01 LAB — COMPREHENSIVE METABOLIC PANEL
ALT: 71 U/L — ABNORMAL HIGH (ref 0–44)
AST: 72 U/L — ABNORMAL HIGH (ref 15–41)
Albumin: 3.9 g/dL (ref 3.5–5.0)
Alkaline Phosphatase: 88 U/L (ref 38–126)
Anion gap: 12 (ref 5–15)
BUN: 16 mg/dL (ref 8–23)
CO2: 20 mmol/L — ABNORMAL LOW (ref 22–32)
Calcium: 9.1 mg/dL (ref 8.9–10.3)
Chloride: 104 mmol/L (ref 98–111)
Creatinine, Ser: 1.32 mg/dL — ABNORMAL HIGH (ref 0.61–1.24)
GFR, Estimated: 59 mL/min — ABNORMAL LOW (ref >60)
Glucose, Bld: 131 mg/dL — ABNORMAL HIGH (ref 70–99)
Potassium: 4.8 mmol/L (ref 3.5–5.1)
Sodium: 136 mmol/L (ref 135–145)
Total Bilirubin: 1.2 mg/dL (ref 0.3–1.2)
Total Protein: 6.8 g/dL (ref 6.5–8.1)

## 2023-05-01 LAB — TROPONIN I (HIGH SENSITIVITY)
Troponin I (High Sensitivity): 58 ng/L — ABNORMAL HIGH (ref <18)
Troponin I (High Sensitivity): 260 ng/L (ref <18)
Troponin I (High Sensitivity): 3926 ng/L (ref <18)

## 2023-05-01 LAB — GLUCOSE, CAPILLARY: Glucose-Capillary: 122 mg/dL — ABNORMAL HIGH (ref 70–99)

## 2023-05-01 LAB — POCT ACTIVATED CLOTTING TIME
Activated Clotting Time: 238 seconds
Activated Clotting Time: 287 seconds
Activated Clotting Time: 330 seconds

## 2023-05-01 LAB — MAGNESIUM: Magnesium: 2.1 mg/dL (ref 1.7–2.4)

## 2023-05-01 LAB — LACTIC ACID, PLASMA
Lactic Acid, Venous: 3.2 mmol/L (ref 0.5–1.9)
Lactic Acid, Venous: 1.6 mmol/L (ref 0.5–1.9)

## 2023-05-01 LAB — CBG MONITORING, ED: Glucose-Capillary: 114 mg/dL — ABNORMAL HIGH (ref 70–99)

## 2023-05-01 SURGERY — LEFT HEART CATH AND CORONARY ANGIOGRAPHY
Anesthesia: LOCAL

## 2023-05-01 SURGERY — CORONARY/GRAFT ACUTE MI REVASCULARIZATION
Anesthesia: LOCAL

## 2023-05-01 MED ORDER — MIDAZOLAM HCL 2 MG/2ML IJ SOLN
INTRAMUSCULAR | Status: DC | PRN
  Administered 2023-05-01: 1 mg via INTRAVENOUS

## 2023-05-01 MED ORDER — LIDOCAINE HCL (PF) 1 % IJ SOLN
INTRAMUSCULAR | Status: DC | PRN
  Administered 2023-05-01: 2 mL via INTRADERMAL

## 2023-05-01 MED ORDER — CANGRELOR TETRASODIUM 50 MG IV SOLR
INTRAVENOUS | Status: AC
  Filled 2023-05-01: qty 50

## 2023-05-01 MED ORDER — LIDOCAINE HCL (PF) 1 % IJ SOLN
INTRAMUSCULAR | Status: AC
  Filled 2023-05-01: qty 30

## 2023-05-01 MED ORDER — ORAL CARE MOUTH RINSE: 15.0000 mL | OROMUCOSAL | Status: DC | PRN

## 2023-05-01 MED ORDER — SODIUM CHLORIDE 0.9 % IV SOLN
INTRAVENOUS | Status: AC | PRN
  Administered 2023-05-01: 4 ug/kg/min via INTRAVENOUS

## 2023-05-01 MED ORDER — ONDANSETRON HCL 4 MG/2ML IJ SOLN
4.0000 mg | Freq: Once | INTRAMUSCULAR | Status: AC
  Administered 2023-05-01: 4 mg via INTRAVENOUS
  Filled 2023-05-01: qty 2

## 2023-05-01 MED ORDER — HEPARIN SODIUM (PORCINE) 1000 UNIT/ML IJ SOLN
INTRAMUSCULAR | Status: DC | PRN
  Administered 2023-05-01: 5000 [IU] via INTRAVENOUS
  Administered 2023-05-01: 4000 [IU] via INTRAVENOUS

## 2023-05-01 MED ORDER — LACTATED RINGERS IV BOLUS
500.0000 mL | Freq: Once | INTRAVENOUS | Status: AC
  Administered 2023-05-01: 500 mL via INTRAVENOUS

## 2023-05-01 MED ORDER — FENTANYL CITRATE (PF) 100 MCG/2ML IJ SOLN
INTRAMUSCULAR | Status: DC | PRN
  Administered 2023-05-01: 50 ug via INTRAVENOUS
  Administered 2023-05-01: 25 ug via INTRAVENOUS

## 2023-05-01 MED ORDER — ASPIRIN 81 MG PO CHEW: 81.0000 mg | CHEWABLE_TABLET | ORAL | Status: AC

## 2023-05-01 MED ORDER — SODIUM CHLORIDE 0.9 % IV SOLN: INTRAVENOUS | Status: DC

## 2023-05-01 MED ORDER — HEPARIN SODIUM (PORCINE) 1000 UNIT/ML IJ SOLN
INTRAMUSCULAR | Status: DC | PRN
  Administered 2023-05-01: 2000 [IU] via INTRAVENOUS

## 2023-05-01 MED ORDER — HEPARIN BOLUS VIA INFUSION
4000.0000 [IU] | Freq: Once | INTRAVENOUS | Status: AC
  Administered 2023-05-01: 4000 [IU] via INTRAVENOUS
  Filled 2023-05-01: qty 4000

## 2023-05-01 MED ORDER — NITROGLYCERIN IN D5W 200-5 MCG/ML-% IV SOLN
0.0000 ug/min | INTRAVENOUS | Status: DC
  Administered 2023-05-01: 5 ug/min via INTRAVENOUS
  Filled 2023-05-01: qty 250

## 2023-05-01 MED ORDER — HEPARIN SODIUM (PORCINE) 1000 UNIT/ML IJ SOLN
INTRAMUSCULAR | Status: AC
  Filled 2023-05-01: qty 10

## 2023-05-01 MED ORDER — CANGRELOR BOLUS VIA INFUSION
INTRAVENOUS | Status: DC | PRN
  Administered 2023-05-01: 3267 ug via INTRAVENOUS

## 2023-05-01 MED ORDER — TICAGRELOR 90 MG PO TABS
90.0000 mg | ORAL_TABLET | Freq: Two times a day (BID) | ORAL | Status: DC
  Administered 2023-05-02 – 2023-05-03 (×3): 90 mg via ORAL
  Filled 2023-05-01 (×3): qty 1

## 2023-05-01 MED ORDER — HEPARIN (PORCINE) 25000 UT/250ML-% IV SOLN
1400.0000 [IU]/h | INTRAVENOUS | Status: DC
  Administered 2023-05-01: 1400 [IU]/h via INTRAVENOUS
  Filled 2023-05-01: qty 250

## 2023-05-01 MED ORDER — IOHEXOL 350 MG/ML SOLN
75.0000 mL | Freq: Once | INTRAVENOUS | Status: AC | PRN
  Administered 2023-05-01: 75 mL via INTRAVENOUS

## 2023-05-01 MED ORDER — NITROGLYCERIN 1 MG/10 ML FOR IR/CATH LAB
INTRA_ARTERIAL | Status: DC | PRN
  Administered 2023-05-01: 100 ug via INTRACORONARY

## 2023-05-01 MED ORDER — ASPIRIN 81 MG PO TBEC
81.0000 mg | DELAYED_RELEASE_TABLET | Freq: Every day | ORAL | Status: DC
  Administered 2023-05-02 – 2023-05-03 (×2): 81 mg via ORAL
  Filled 2023-05-01 (×2): qty 1

## 2023-05-01 MED ORDER — SODIUM CHLORIDE 0.9% FLUSH
3.0000 mL | Freq: Two times a day (BID) | INTRAVENOUS | Status: DC
  Administered 2023-05-01 – 2023-05-03 (×4): 3 mL via INTRAVENOUS

## 2023-05-01 MED ORDER — FENTANYL CITRATE PF 50 MCG/ML IJ SOSY
50.0000 ug | PREFILLED_SYRINGE | Freq: Once | INTRAMUSCULAR | Status: AC
  Administered 2023-05-01: 50 ug via INTRAVENOUS
  Filled 2023-05-01: qty 1

## 2023-05-01 MED ORDER — SODIUM CHLORIDE 0.9 % IV SOLN
4.0000 ug/kg/min | INTRAVENOUS | Status: AC
  Filled 2023-05-01: qty 50

## 2023-05-01 MED ORDER — TICAGRELOR 90 MG PO TABS
ORAL_TABLET | ORAL | Status: DC | PRN
  Administered 2023-05-01: 180 mg via ORAL

## 2023-05-01 MED ORDER — FUROSEMIDE 10 MG/ML IJ SOLN
20.0000 mg | Freq: Once | INTRAMUSCULAR | Status: AC
  Administered 2023-05-01: 20 mg via INTRAVENOUS
  Filled 2023-05-01: qty 2

## 2023-05-01 MED ORDER — MIDAZOLAM HCL 2 MG/2ML IJ SOLN
INTRAMUSCULAR | Status: AC
  Filled 2023-05-01: qty 2

## 2023-05-01 MED ORDER — ENOXAPARIN SODIUM 40 MG/0.4ML IJ SOSY
40.0000 mg | PREFILLED_SYRINGE | INTRAMUSCULAR | Status: DC
  Administered 2023-05-02 – 2023-05-03 (×2): 40 mg via SUBCUTANEOUS
  Filled 2023-05-01 (×2): qty 0.4

## 2023-05-01 MED ORDER — ACETAMINOPHEN 325 MG PO TABS: 650.0000 mg | ORAL_TABLET | ORAL | Status: DC | PRN

## 2023-05-01 MED ORDER — ATORVASTATIN CALCIUM 80 MG PO TABS
80.0000 mg | ORAL_TABLET | Freq: Every day | ORAL | Status: DC
  Administered 2023-05-01: 80 mg via ORAL
  Filled 2023-05-01: qty 1

## 2023-05-01 MED ORDER — VERAPAMIL HCL 2.5 MG/ML IV SOLN
INTRAVENOUS | Status: AC
  Filled 2023-05-01: qty 2

## 2023-05-01 MED ORDER — HEPARIN (PORCINE) IN NACL 1000-0.9 UT/500ML-% IV SOLN
INTRAVENOUS | Status: DC | PRN
  Administered 2023-05-01 (×2): 500 mL

## 2023-05-01 MED ORDER — ROSUVASTATIN CALCIUM 20 MG PO TABS: 40.0000 mg | ORAL_TABLET | Freq: Every day | ORAL | Status: DC

## 2023-05-01 MED ORDER — NITROGLYCERIN 1 MG/10 ML FOR IR/CATH LAB
INTRA_ARTERIAL | Status: AC
  Filled 2023-05-01: qty 10

## 2023-05-01 MED ORDER — NITROGLYCERIN 0.4 MG SL SUBL: 0.4000 mg | SUBLINGUAL_TABLET | SUBLINGUAL | Status: DC | PRN

## 2023-05-01 MED ORDER — IOHEXOL 350 MG/ML SOLN
INTRAVENOUS | Status: DC | PRN
  Administered 2023-05-01: 110 mL

## 2023-05-01 MED ORDER — FENTANYL CITRATE (PF) 100 MCG/2ML IJ SOLN
INTRAMUSCULAR | Status: AC
  Filled 2023-05-01: qty 2

## 2023-05-01 MED ORDER — VERAPAMIL HCL 2.5 MG/ML IV SOLN
INTRAVENOUS | Status: DC | PRN
  Administered 2023-05-01: 10 mL via INTRA_ARTERIAL

## 2023-05-01 MED ORDER — HYDRALAZINE HCL 20 MG/ML IJ SOLN: 10.0000 mg | INTRAMUSCULAR | Status: AC | PRN

## 2023-05-01 MED ORDER — SODIUM CHLORIDE 0.9 % IV SOLN: 250.0000 mL | INTRAVENOUS | Status: DC | PRN

## 2023-05-01 MED ORDER — ALPRAZOLAM 0.5 MG PO TABS: 0.5000 mg | ORAL_TABLET | Freq: Three times a day (TID) | ORAL | Status: DC | PRN

## 2023-05-01 MED ORDER — SODIUM CHLORIDE 0.9% FLUSH: 3.0000 mL | INTRAVENOUS | Status: DC | PRN

## 2023-05-01 MED ORDER — TICAGRELOR 90 MG PO TABS
ORAL_TABLET | ORAL | Status: AC
  Filled 2023-05-01: qty 2

## 2023-05-01 MED ORDER — ONDANSETRON HCL 4 MG/2ML IJ SOLN: 4.0000 mg | Freq: Four times a day (QID) | INTRAMUSCULAR | Status: DC | PRN

## 2023-05-01 SURGICAL SUPPLY — 23 items
BALLN EMERGE MR 2.5X12 (BALLOONS) ×1
BALLN ~~LOC~~ EMERGE MR 3.5X15 (BALLOONS) ×1
BALLOON EMERGE MR 2.5X12 (BALLOONS) IMPLANT
BALLOON ~~LOC~~ EMERGE MR 3.5X15 (BALLOONS) IMPLANT
CATH 5FR JL3.5 JR4 ANG PIG MP (CATHETERS) IMPLANT
CATH VISTA GUIDE 6FR XBLAD3.5 (CATHETERS) IMPLANT
DEVICE RAD COMP TR BAND LRG (VASCULAR PRODUCTS) IMPLANT
GLIDESHEATH SLEND SS 6F .021 (SHEATH) IMPLANT
GUIDEWIRE INQWIRE 1.5J.035X260 (WIRE) IMPLANT
INQWIRE 1.5J .035X260CM (WIRE) ×1
KIT ENCORE 26 ADVANTAGE (KITS) IMPLANT
KIT HEART LEFT (KITS) ×1 IMPLANT
PACK CARDIAC CATHETERIZATION (CUSTOM PROCEDURE TRAY) ×1 IMPLANT
PROTECTION STATION PRESSURIZED (MISCELLANEOUS)
SHEATH PINNACLE 6F 10CM (SHEATH) IMPLANT
SLEEVE REPOSITIONING LENGTH 30 (MISCELLANEOUS) IMPLANT
STATION PROTECTION PRESSURIZED (MISCELLANEOUS) IMPLANT
STENT SYNERGY XD 3.0X28 (Permanent Stent) IMPLANT
SYNERGY XD 3.0X28 (Permanent Stent) ×1 IMPLANT
TRANSDUCER W/STOPCOCK (MISCELLANEOUS) ×1 IMPLANT
TUBING CIL FLEX 10 FLL-RA (TUBING) ×1 IMPLANT
WIRE MICRO SET SILHO 5FR 7 (SHEATH) IMPLANT
WIRE RUNTHROUGH .014X180CM (WIRE) IMPLANT

## 2023-05-01 NOTE — ED Notes (Signed)
Patient transported to CT 

## 2023-05-01 NOTE — Consult Note (Signed)
Cardiology Consultation   Patient ID: Connor Haas. MRN: [id number]; DOB: 03-14-1956  Admit date: 05/01/2023 Date of Consult: 05/01/2023  PCP:  Elie Confer, NP   [facility] Providers Cardiologist:  None        Patient Profile:   Connor Haas. is a 67 y.o. 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 Jearld Fenton.  History of Present Illness:   Connor Haas was in his usual state of health until about an hour before arrival.  He had just finished taking a shower and had sudden onset of substernal chest pressure radiating to the left arm.  It persisted several minutes and prompted him to call 911.  He was advised to take aspirin 324 mg.  When EMS arrived, he appeared uncomfortable and complained of 7/10 chest pain.  He was given sublingual nitroglycerin x 3 with improvement in chest pain to 4/10.  EKG showed right bundle branch block.  There was also concern for possible STEMI, prompting activation of the STEMI team.  Currently, he still has 6/10 chest pain.  He denies shortness of breath, palpitations, and nausea.  He notes having recently been diagnosed with lactose intolerance and occasionally blacks out with abdominal pain after eating certain foods.  He has not eaten anything today.   Past Medical History:  Diagnosis Date   Acute appendicitis 10/05/2013   Lap Appy 10/05/13    Anxiety    Chronic pain    Degenerative joint disease    Depression    GERD (gastroesophageal reflux disease)    Hyperlipidemia    Hypertension    Pneumonia     Past Surgical History:  Procedure Laterality Date   APPENDECTOMY     LAPAROSCOPIC APPENDECTOMY N/A 10/05/2013   Procedure: APPENDECTOMY LAPAROSCOPIC;  Surgeon: Valarie Merino, MD;  Location: [facility];  Service: General;  Laterality: N/A;     Home  medications: As needed alprazolam and hydrocodone  Inpatient Medications: Scheduled Meds:  Continuous Infusions:  PRN Meds:   Allergies:    Allergies  Allergen Reactions   Penicillins Nausea Only    Sweating & a high fever   Penicillins    Sertraline Hcl     REACTION: headache   Zoloft [Sertraline]     Headache   Oxycodone     Rash with percocet.  Has  Tolerated hydrocodone in the past.      Social History:   Social History   Tobacco Use   Smoking status: Former    Current packs/day: 0.00    Types: Cigarettes    Quit date: 10/12/2012    Years since quitting: 10.5   Smokeless tobacco: Never  Vaping Use   Vaping status: Never Used  Substance Use Topics   Alcohol use: Not Currently    Comment: Nothing since 2014   Drug use: Yes    Types: Marijuana    Family History:   Family History  Problem Relation Age of Onset   Stroke Mother    Diverticulitis Father    Heart disease Father 25   CAD Father    Colon cancer Maternal Grandmother    Colon polyps Neg Hx      ROS:  Please see the history of present illness. All other ROS reviewed and negative.     Physical Exam/Data:   Vitals:  05/01/23 1324  BP: (!) 149/81  Pulse: (!) 52  Resp: 18  Temp: (!) 97.5 F (36.4 C)  TempSrc: Oral  SpO2: 100%   No intake or output data in the 24 hours ending 05/01/23 1347    12/19/2019    1:41 PM 12/04/2019   10:14 AM 10/19/2013   12:33 PM  Last 3 Weights  Weight (lbs) 200 lb 200 lb 6.4 oz 240 lb 6.4 oz  Weight (kg) 90.719 kg 90.901 kg 109.045 kg     There is no height or weight on file to calculate BMI.  General:  Well nourished, well developed, in no acute distress. HEENT: normal Neck: no JVD Vascular: No carotid bruits; Distal pulses 2+ bilaterally Cardiac: Bradycardic but regular without murmurs, rubs, or gallops Lungs:  clear to auscultation bilaterally, no wheezing, rhonchi or rales  Abd: soft, nontender, no hepatomegaly  Ext: no edema Musculoskeletal:  No  deformities, BUE and BLE strength normal and equal Skin: warm and dry  Neuro:  CNs 2-12 intact, no focal abnormalities noted Psych:  Normal affect   EKG:  The EKG was personally reviewed and demonstrates: Sinus bradycardia with right bundle branch block and nonspecific ST segment changes. Telemetry:  Telemetry was personally reviewed and demonstrates: Sinus bradycardia with PACs  Relevant CV Studies: Echocardiogram (12/28/2012): Technically difficult study.  LVEF normal in size with mild LVH.  LVEF 65%.  Normal RV size and function.  No significant valvular abnormalities.  Laboratory Data:  High Sensitivity Troponin:  No results for input(s): "TROPONINIHS" in the last 720 hours.   ChemistryNo results for input(s): "NA", "K", "CL", "CO2", "GLUCOSE", "BUN", "CREATININE", "CALCIUM", "MG", "GFRNONAA", "GFRAA", "ANIONGAP" in the last 168 hours.  No results for input(s): "PROT", "ALBUMIN", "AST", "ALT", "ALKPHOS", "BILITOT" in the last 168 hours. Lipids No results for input(s): "CHOL", "TRIG", "HDL", "LABVLDL", "LDLCALC", "CHOLHDL" in the last 168 hours.  Hematology Recent Labs  Lab 05/01/23 1320  WBC 9.9  RBC 4.96  HGB 14.9  HCT 44.3  MCV 89.3  MCH 30.0  MCHC 33.6  RDW 13.2  PLT 211   Thyroid No results for input(s): "TSH", "FREET4" in the last 168 hours.  BNPNo results for input(s): "BNP", "PROBNP" in the last 168 hours.  DDimer No results for input(s): "DDIMER" in the last 168 hours.   Radiology/Studies:  No results found.   Assessment and Plan:   Chest pain: Connor Haas presents with sudden onset of chest pain rating to the left arm and no associated symptoms.  His EKG demonstrates sinus bradycardia with right bundle branch block and nonspecific ST segment changes.  It does NOT meet STEMI criteria but is different from his last tracing in our system in 2014, when heart rate was higher and RBBB was not present.  I recommend against emergent cardiac catheterization at this time  pending.  Recommend pain control with nitroglycerin and opiates.  Serial troponins should be checked and EKG repeated.  If he has dynamic EKG changes concerning for developing ST elevation myocardial infarction or rising troponins and inability to control his pain with maximal antianginal therapy, urgent cardiac catheterization will need to be readdressed.  Recommend further workup for potential noncardiac causes of chest pain per the ED team.  Urine drug screen also recommended.  For questions or updates, please contact [facility] Please consult [email or web address] for contact info under    Signed, Yvonne Kendall, MD  05/01/2023 1:47 PM

## 2023-05-01 NOTE — Progress Notes (Signed)
   05/01/23 1300  Spiritual Encounters  Type of Visit Attempt (pt unavailable)  Conversation partners present during encounter Nurse;Physician  Referral source Other (comment)  Reason for visit Code  OnCall Visit Yes   Responded to ED31 for code STEMI, spoke with medical staff team. No family present. Code STEMI cancelled.

## 2023-05-01 NOTE — ED Notes (Signed)
Critical lactic 3.2

## 2023-05-01 NOTE — Progress Notes (Signed)
   Went to check on the patient and he reports his chest pain has improved from an 8-9/10 to 4/10. Still feels uncomfortable and not back to baseline. Hs troponin values have trended up from 58 to 260. Lactic acid also elevated at 3.2. Reviewed with Dr. Okey Dupre and he reviewed the patient's chest CTA which is concerning for a tight/occluded mid LAD. He plans to review this with the patient and anticipate urgent catheterization this evening if the patient is in agreement.   Signed, Ellsworth Lennox, PA-C 05/01/2023, 5:06 PM

## 2023-05-01 NOTE — Interval H&P Note (Signed)
History and Physical Interval Note:  05/01/2023 6:06 PM  Connor Haas.  has presented today for surgery, with the diagnosis of non-ST elevation myocardial infarction.  The various methods of treatment have been discussed with the patient and family. After consideration of risks, benefits and other options for treatment, the patient has consented to  Procedure(s): LEFT HEART CATH AND CORONARY ANGIOGRAPHY (N/A) as a surgical intervention.  The patient's history has been reviewed, patient examined, no change in status, stable for surgery.  I have reviewed the patient's chart and labs.  Questions were answered to the patient's satisfaction.    Cath Lab Visit (complete for each Cath Lab visit)  Clinical Evaluation Leading to the Procedure:   ACS: Yes.    Non-ACS:  N/A  Connor Haas

## 2023-05-01 NOTE — ED Provider Notes (Incomplete)
[place] EMERGENCY DEPARTMENT AT [facility] Provider Note   CSN: [id number] Arrival date & time: 05/01/23  1308     History {Add pertinent medical, surgical, social history, OB history to HPI:1} Chief Complaint  Patient presents with   Code STEMI   Connor Haas. is a 67 y.o. male with PMH as listed below who presents with chest pain that began this morning while he was showering.  Radiates into his left arm.  Very severe, 7 out of 10.  Associated with Mild shortness of breath and nausea with no vomiting.  He took 1 Xanax, 325 mg of aspirin this morning.  He received 3 sublingual nitros by EMS prior to arrival and pain went from a 7 out of 10 to a 4 out of 10.  On arrival patient is uncomfortable appearing and states that the pain is increasing to 6 out of 10. No history of cardiac disease or DVT/PE.  He does not take a blood thinner. Denies f/c, cough, numbness/tingling.  Endorsed diaphoresis during the episode especially when it started.  Code STEMI was called from EMS due to concern for elevations in the anterior leads and ongoing severe chest pain.  On arrival cardiology evaluated patient and repeat EKG did not demonstrate any elevations. Called off code STEMI.  Past Medical History:  Diagnosis Date   Acute appendicitis 10/05/2013   Lap Appy 10/05/13    Anxiety    Chronic pain    Degenerative joint disease    Depression    GERD (gastroesophageal reflux disease)    Hyperlipidemia    Hypertension    Pneumonia        Home Medications Prior to Admission medications   Medication Sig Start Date End Date Taking? Authorizing Provider  ALPRAZolam Prudy Feeler) 1 MG tablet Take 0.5 mg by mouth 3 (three) times daily as needed for sleep. For anxiety    [provider]  aspirin 325 MG tablet Take 650 mg by mouth daily.    [provider]  aspirin EC 81 MG tablet Take 81 mg by mouth daily.    [provider]  lisinopril (ZESTRIL) 20 MG tablet Take  20 mg by mouth daily.    [provider]  loperamide (IMODIUM A-D) 2 MG tablet Take 2 mg by mouth 4 (four) times daily as needed for diarrhea or loose stools.    [provider]  MELATONIN PO Take 1 tablet by mouth at bedtime. scheduled    [provider]  oxyCODONE-acetaminophen (ROXICET) 5-325 MG per tablet Take 1 tablet by mouth every 4 (four) hours as needed. Patient not taking: Reported on 12/19/2019 10/06/13   Cyndia Bent, MD      Allergies    Penicillins, Penicillins, Sertraline hcl, Zoloft [sertraline], and Oxycodone    Review of Systems   Review of Systems A 10 point review of systems was performed and is negative unless otherwise reported in HPI.  Physical Exam Updated Vital Signs BP (!) 149/81 (BP Location: Right Arm)   Pulse (!) 52   Temp (!) 97.5 F (36.4 C) (Oral)   Resp 18   SpO2 100%  Physical Exam General: Uncomfortable appearing male, lying in bed.  HEENT: Sclera anicteric, MMM, trachea midline.  Cardiology: RRR, no murmurs/rubs/gallops. BL radial and DP pulses equal bilaterally.  Resp: Normal respiratory rate and effort. CTAB, no wheezes, rhonchi, crackles.  Abd: Soft, non-tender, non-distended. No rebound tenderness or guarding.  GU: Deferred. MSK: No peripheral edema or signs of  trauma. Skin: warm, dry.  Neuro: A&Ox4, CNs II-XII grossly intact. MAEs. Sensation grossly intact.  Psych: Normal mood and affect.   ED Results / Procedures / Treatments   Labs (all labs ordered are listed, but only abnormal results are displayed) Labs Reviewed  COMPREHENSIVE METABOLIC PANEL - Abnormal; Notable for the following components:      Result Value   CO2 20 (*)    Glucose, Bld 131 (*)    Creatinine, Ser 1.32 (*)    AST 72 (*)    ALT 71 (*)    GFR, Estimated 59 (*)    All other components within normal limits  LACTIC ACID, PLASMA - Abnormal; Notable for the following components:   Lactic Acid, Venous 3.2 (*)    All other components  within normal limits  CBG MONITORING, ED - Abnormal; Notable for the following components:   Glucose-Capillary 114 (*)    All other components within normal limits  TROPONIN I (HIGH SENSITIVITY) - Abnormal; Notable for the following components:   Troponin I (High Sensitivity) 58 (*)    All other components within normal limits  CBC WITH DIFFERENTIAL/PLATELET  MAGNESIUM  LACTIC ACID, PLASMA    EKG EKG Interpretation Date/Time:  Saturday May 01 2023 13:16:43 EDT Ventricular Rate:  52 PR Interval:  141 QRS Duration:  149 QT Interval:  460 QTC Calculation: 428 R Axis:   94  Text Interpretation: Sinus rhythm Right bundle branch block Confirmed by Vivi Barrack [phone]) on 05/01/2023 1:36:49 PM  Radiology DG Chest Portable 1 View  Result Date: 05/01/2023 CLINICAL DATA:  Chest pain.  Code STEMI. EXAM: PORTABLE CHEST 1 VIEW COMPARISON:  Radiographs 12/28/2012.  Abdominal CT 10/05/2013. FINDINGS: 1338 hours. Two views obtained. The heart size and mediastinal contours are normal. The lungs are clear. There is no pleural effusion or pneumothorax. No acute osseous findings are identified. Stable degenerative changes in the thoracic spine. Telemetry leads overlie the chest. IMPRESSION: No evidence of active cardiopulmonary process. Stable degenerative changes of the thoracic spine. Electronically Signed   By: Carey Bullocks M.D.   On: 05/01/2023 13:55    Procedures Procedures  {Document cardiac monitor, telemetry assessment procedure when appropriate:1}  Medications Ordered in ED Medications  lactated ringers bolus 500 mL (has no administration in time range)  fentaNYL (SUBLIMAZE) injection 50 mcg (50 mcg Intravenous Given 05/01/23 1340)  ondansetron (ZOFRAN) injection 4 mg (4 mg Intravenous Given 05/01/23 1340)    ED Course/ Medical Decision Making/ A&P                          Medical Decision Making Amount and/or Complexity of Data Reviewed Labs: ordered. Decision-making details  documented in ED Course. Radiology: ordered. Decision-making details documented in ED Course.  Risk Prescription drug management.    This patient presents to the ED for concern of severe CP, this involves an extensive number of treatment options, and is a complaint that carries with it a high risk of complications and morbidity.  I considered the following differential and admission for this acute, potentially life threatening condition.   MDM:     Cardiology Dr. Okey Dupre called off code STEMI and will continue to evaluate patient.  DDX for chest pain includes but is not limited to:  ACS/arrhythmia,  PE, aortic dissection, biliary disease,, GERD/PUD/gastritis, or musculoskeletal pain. Very low suspicion for ACS vs aortic dissection given presenting sx. Patient cannot PERC out based on age, and has h/o HTN w/  severe pain. He has normal width mediastinum on CXR and equal pulses in all extremities, lower c/f dissection, but patient is very uncomfortable and will obtain CTA chest to further evaluate. No abdominal pain and no c/f biliary disease - he has stable transaminitis to slightly decreased transaminitis, no RUQ TTP. No diffuse STEs to indicate pericarditis.  Clinical Course as of 05/01/23 1449  Sat May 01, 2023  1448 Troponin I (High Sensitivity)(!): 58 Mildly elevated, awaiting repeat [HN]  1448 Still ongoing CP rated 4/10, will obtain repeat trop [HN]  1448 Stable to decreased transaminitis on labs. No leuokcytosis or anemia. Mild elevation Cr to 1.32 from BL ~1.1.  [HN]  1448 Lactic Acid, Venous(!!): 3.2 Will give fluids [HN]  1449 DG Chest Portable 1 View No evidence of active cardiopulmonary process. Stable degenerative changes of the thoracic spine.   [HN]    Clinical Course User Index [HN] Loetta Rough, MD    Labs: I Ordered, and personally interpreted labs.  The pertinent results include:  those listed above  Imaging Studies ordered: I ordered imaging studies including  CXR, CTA chest I independently visualized and interpreted imaging. I agree with the radiologist interpretation  Additional history obtained from EMS, chart review, wife at bedside.    Cardiac Monitoring: The patient was maintained on a cardiac monitor.  I personally viewed and interpreted the cardiac monitored which showed an underlying rhythm of: NSR, sinus bradycardia  Reevaluation: After the interventions noted above, I reevaluated the patient and found that they have :improved  Social Determinants of Health: Lives independently  Disposition:  ***  Co morbidities that complicate the patient evaluation  Past Medical History:  Diagnosis Date   Acute appendicitis 10/05/2013   Lap Appy 10/05/13    Anxiety    Chronic pain    Degenerative joint disease    Depression    GERD (gastroesophageal reflux disease)    Hyperlipidemia    Hypertension    Pneumonia      Medicines Meds ordered this encounter  Medications   fentaNYL (SUBLIMAZE) injection 50 mcg   ondansetron (ZOFRAN) injection 4 mg   lactated ringers bolus 500 mL    I have reviewed the patients home medicines and have made adjustments as needed  Problem List / ED Course: Problem List Items Addressed This Visit   None        {Document critical care time when appropriate:1} {Document review of labs and clinical decision tools ie heart score, Chads2Vasc2 etc:1}  {Document your independent review of radiology images, and any outside records:1} {Document your discussion with family members, caretakers, and with consultants:1} {Document social determinants of health affecting pt's care:1} {Document your decision making why or why not admission, treatments were needed:1}  This note was created using dictation software, which may contain spelling or grammatical errors.

## 2023-05-01 NOTE — ED Notes (Signed)
Cardiology at bedside.

## 2023-05-01 NOTE — ED Triage Notes (Signed)
Pt bib ems as a code stemi. Pt got out of the shower around noon and started to feel chest pressure that radiated to his left arm. Pt took 1 xanax and 2 baby ASA when it started. Pt given 3 nitroglycerin by ems PTA. Pain went from 7/10 to 5/10. Pt arrives a.o, VSS

## 2023-05-01 NOTE — ED Provider Notes (Incomplete)
[facility] AT [facility] Provider Note   CSN: [id number] Arrival date & time: 05/01/23  1308     History {Add pertinent medical, surgical, social history, OB history to HPI:1} Chief Complaint  Patient presents with  . Code STEMI    Connor Haas. is a 67 y.o. male with PMH as listed below who presents with chest pain that began this morning while he was showering.  Radiates into his left arm.  Very severe, 7 out of 10.  Associated with Mild shortness of breath and nausea with no vomiting.  He took 1 Xanax, 325 mg of aspirin this morning.  He received 3 sublingual nitros by EMS prior to arrival and pain went from a 7 out of 10 to a 4 out of 10.  On arrival patient is uncomfortable appearing and states that the pain is increasing to 6 out of 10. No history of cardiac disease or DVT/PE.  He does not take a blood thinner. Denies f/c, cough, numbness/tingling.    Past Medical History:  Diagnosis Date  . Acute appendicitis 10/05/2013   Lap Appy 10/05/13   . Anxiety   . Degenerative joint disease   . Depression   . GERD (gastroesophageal reflux disease)   . Hypertension   . Pneumonia        Home Medications Prior to Admission medications   Medication Sig Start Date End Date Taking? Authorizing Provider  ALPRAZolam Prudy Feeler) 1 MG tablet Take 0.5 mg by mouth 3 (three) times daily as needed for sleep. For anxiety    [provider]  aspirin 325 MG tablet Take 650 mg by mouth daily.    [provider]  aspirin EC 81 MG tablet Take 81 mg by mouth daily.    [provider]  lisinopril (ZESTRIL) 20 MG tablet Take 20 mg by mouth daily.    [provider]  loperamide (IMODIUM A-D) 2 MG tablet Take 2 mg by mouth 4 (four) times daily as needed for diarrhea or loose stools.    [provider]  MELATONIN PO Take 1 tablet by mouth at bedtime. scheduled    [provider]  oxyCODONE-acetaminophen (ROXICET)  5-325 MG per tablet Take 1 tablet by mouth every 4 (four) hours as needed. Patient not taking: Reported on 12/19/2019 10/06/13   Cyndia Bent, MD      Allergies    Penicillins, Penicillins, Sertraline hcl, Zoloft [sertraline], and Oxycodone    Review of Systems   Review of Systems A 10 point review of systems was performed and is negative unless otherwise reported in HPI.  Physical Exam Updated Vital Signs BP (!) 149/81 (BP Location: Right Arm)   Pulse (!) 52   Temp (!) 97.5 F (36.4 C) (Oral)   Resp 18   SpO2 100%  Physical Exam General: Normal appearing male, lying in bed.  HEENT: PERRLA, Sclera anicteric, MMM, trachea midline.  Cardiology: RRR, no murmurs/rubs/gallops. BL radial and DP pulses equal bilaterally.  Resp: Normal respiratory rate and effort. CTAB, no wheezes, rhonchi, crackles.  Abd: Soft, non-tender, non-distended. No rebound tenderness or guarding.  GU: Deferred. MSK: No peripheral edema or signs of trauma. Extremities without deformity or TTP. No cyanosis or clubbing. Skin: warm, dry. No rashes or lesions. Back: No CVA tenderness Neuro: A&Ox4, CNs II-XII grossly intact. MAEs. Sensation grossly intact.  Psych: Normal mood and affect.   ED Results / Procedures / Treatments   Labs (all labs ordered are listed, but  only abnormal results are displayed) Labs Reviewed  CBC WITH DIFFERENTIAL/PLATELET  COMPREHENSIVE METABOLIC PANEL  MAGNESIUM  LACTIC ACID, PLASMA  LACTIC ACID, PLASMA  CBG MONITORING, ED  TROPONIN I (HIGH SENSITIVITY)    EKG None  Radiology No results found.  Procedures Procedures  {Document cardiac monitor, telemetry assessment procedure when appropriate:1}  Medications Ordered in ED Medications - No data to display  ED Course/ Medical Decision Making/ A&P                          Medical Decision Making Amount and/or Complexity of Data Reviewed Labs: ordered. Radiology: ordered.    This patient presents to the ED for  concern of ***, this involves an extensive number of treatment options, and is a complaint that carries with it a high risk of complications and morbidity.  I considered the following differential and admission for this acute, potentially life threatening condition.   MDM:    DDX for chest pain includes but is not limited to:  ACS/arrhythmia,  PE, aortic dissection, PNA, PTX, esophogeal rupture, biliary disease, cardiac tamponade, pericarditis, GERD/PUD/gastritis, or musculoskeletal pain. Very low suspicion for ACS vs aortic dissection given presenting sx. Patient cannot PERC out based on tachycardia, but will obtain D dimer and reassess, minimal risk factors for PE. No c/f dissection. No abdominal pain and no c/f biliary disease. Consider GERD, given known history.        Labs: I Ordered, and personally interpreted labs.  The pertinent results include:  ***  Imaging Studies ordered: I ordered imaging studies including *** I independently visualized and interpreted imaging. I agree with the radiologist interpretation  Additional history obtained from ***.  External records from outside source obtained and reviewed including ***  Cardiac Monitoring: .The patient was maintained on a cardiac monitor.  I personally viewed and interpreted the cardiac monitored which showed an underlying rhythm of: ***  Reevaluation: After the interventions noted above, I reevaluated the patient and found that they have :improved  Social Determinants of Health: .Lives independently  Disposition:  ***  Co morbidities that complicate the patient evaluation . Past Medical History:  Diagnosis Date  . Acute appendicitis 10/05/2013   Lap Appy 10/05/13   . Anxiety   . Degenerative joint disease   . Depression   . GERD (gastroesophageal reflux disease)   . Hypertension   . Pneumonia      Medicines No orders of the defined types were placed in this encounter.   I have reviewed the patients home  medicines and have made adjustments as needed  Problem List / ED Course: Problem List Items Addressed This Visit   None        {Document critical care time when appropriate:1} {Document review of labs and clinical decision tools ie heart score, Chads2Vasc2 etc:1}  {Document your independent review of radiology images, and any outside records:1} {Document your discussion with family members, caretakers, and with consultants:1} {Document social determinants of health affecting pt's care:1} {Document your decision making why or why not admission, treatments were needed:1}  This note was created using dictation software, which may contain spelling or grammatical errors.

## 2023-05-01 NOTE — ED Notes (Signed)
Phlebotomy called to collect repeat trop and lactic acid

## 2023-05-01 NOTE — ED Notes (Signed)
Pt to cath lab with this RN

## 2023-05-01 NOTE — Progress Notes (Signed)
ANTICOAGULATION CONSULT NOTE - Initial Consult  Pharmacy Consult for Heparin Indication: chest pain/ACS  Allergies  Allergen Reactions   Penicillins Nausea Only    Sweating & a high fever   Penicillins    Sertraline Hcl     REACTION: headache   Zoloft [Sertraline]     Headache   Oxycodone     Rash with percocet.  Has  Tolerated hydrocodone in the past.      Patient Measurements:   Heparin Dosing Weight: 102.6 kg  Vital Signs: Temp: 97.5 F (36.4 C) (07/20 1324) Temp Source: Oral (07/20 1324) BP: 133/71 (07/20 1430) Pulse Rate: 50 (07/20 1430)  Labs: Recent Labs    05/01/23 1320  HGB 14.9  HCT 44.3  PLT 211  CREATININE 1.32*  TROPONINIHS 58*    CrCl cannot be calculated (Unknown ideal weight.).   Medical History: Past Medical History:  Diagnosis Date   Acute appendicitis 10/05/2013   Lap Appy 10/05/13    Anxiety    Chronic pain    Degenerative joint disease    Depression    GERD (gastroesophageal reflux disease)    Hyperlipidemia    Hypertension    Pneumonia     Medications:  (Not in a hospital admission)  Scheduled:  Infusions:   nitroGLYCERIN 5 mcg/min (05/01/23 1527)   PRN:   Assessment: 67 yom with a history of HTN, HLD, GERD, anxiety, chronic pain. Patient is presenting with chest pain. Originally activated as STEMI but Cardiology cancelled STEMI activation upon evaluation in the ED. Heparin per pharmacy consult placed for chest pain/ACS.  Patient is not on anticoagulation prior to arrival.  Hgb 14.9; plt 211  Goal of Therapy:  Heparin level 0.3-0.7 units/ml Monitor platelets by anticoagulation protocol: Yes   Plan:  Give IV heparin 4000 units bolus x 1 Start heparin infusion at 1400 units/hr Check anti-Xa level in 6 hours and daily while on heparin Continue to monitor H&H and platelets  Delmar Landau, PharmD, BCPS 05/01/2023 3:30 PM ED Clinical Pharmacist -  [phone]

## 2023-05-01 NOTE — H&P (Signed)
Cardiology Admission History and Physical   Patient ID: [name]. MRN: [id number]; DOB: 29-Apr-1956   Admission date: 05/01/2023  PCP:  [facility]   [facility] Providers Cardiologist:  New   Chief Complaint:  Chest pain  Patient Profile:   Khoury Siemon. is a 67 y.o. male with hypertension, hyperlipidemia, self-reported borderline diabetes mellitus, anxiety, and chronic pain, who is being seen 05/01/2023 for the evaluation of chest pain.  History of Present Illness:   Mr. Lax was in his usual state of health until about an hour before arrival. He had just finished taking a shower and had sudden onset of substernal chest pressure radiating to the left arm. It persisted several minutes and prompted him to call 911. He was advised to take aspirin 324 mg. When EMS arrived, he appeared uncomfortable and complained of 7/10 chest pain. He was given sublingual nitroglycerin x 3 with improvement in chest pain to 4/10. EKG showed right bundle branch block. There was also concern for possible STEMI, prompting activation of the STEMI team. Currently, he still has 6/10 chest pain. He denies shortness of breath, palpitations, and nausea. He notes having recently been diagnosed with lactose intolerance and occasionally blacks out with abdominal pain after eating certain foods. He has not eaten anything today.   I initially evaluated him around 1330 today, at which time emergent cardiac catheterization was deferred as EKG did not meet STEMI criteria.  EKG's have continued to show RBBB with nonspecific ST segment changes, though rising troponin is present (58->260).  Lactic acid was also mildly elevated.  The patient was started on heparin infusion and IV nitroglycerin (up to 20 mcg/min) with continued 4/10 chest pain.  CTA chest was negative for acute aortic pathology and PE.  Coronary artery calcification was noted.  On personal review of the chest CT, there  appears to be high-grade disease in the mid LAD.   Past Medical History:  Diagnosis Date   Acute appendicitis 10/05/2013   Lap Appy 10/05/13    Anxiety    Chronic pain    Degenerative joint disease    Depression    GERD (gastroesophageal reflux disease)    Hyperlipidemia    Hypertension    Pneumonia     Past Surgical History:  Procedure Laterality Date   APPENDECTOMY     LAPAROSCOPIC APPENDECTOMY N/A 10/05/2013   Procedure: APPENDECTOMY LAPAROSCOPIC;  Surgeon: Valarie Merino, MD;  Location: [facility]S;  Service: General;  Laterality: N/A;     Medications Prior to Admission: Prior to Admission medications   Medication Sig Start Date Novia Lansberry Date Taking? Authorizing Provider  ALPRAZolam Prudy Feeler) 1 MG tablet Take 0.5 mg by mouth 3 (three) times daily as needed for sleep. For anxiety    [provider]  aspirin 325 MG tablet Take 650 mg by mouth daily.    [provider]  aspirin EC 81 MG tablet Take 81 mg by mouth daily.    [provider]  lisinopril (ZESTRIL) 20 MG tablet Take 20 mg by mouth daily.    [provider]  loperamide (IMODIUM A-D) 2 MG tablet Take 2 mg by mouth 4 (four) times daily as needed for diarrhea or loose stools.    [provider]  MELATONIN PO Take 1 tablet by mouth at bedtime. scheduled    [provider]  oxyCODONE-acetaminophen (ROXICET) 5-325 MG per tablet Take 1 tablet by mouth every 4 (four) hours as needed. Patient  not taking: Reported on 12/19/2019 10/06/13   Cyndia Bent, MD     Allergies:    Allergies  Allergen Reactions   Penicillins Nausea Only    Sweating & a high fever   Penicillins    Sertraline Hcl     REACTION: headache   Zoloft [Sertraline]     Headache   Oxycodone     Rash with percocet.  Has  Tolerated hydrocodone in the past.      Social History:   Social History   Tobacco Use   Smoking status: Former    Current packs/day: 0.00    Types: Cigarettes    Quit date:  10/12/2012    Years since quitting: 10.5   Smokeless tobacco: Never  Vaping Use   Vaping status: Never Used  Substance Use Topics   Alcohol use: Not Currently    Comment: Nothing since 2014   Drug use: Yes    Types: Marijuana     Family History:   The patient's family history includes CAD in his father; Colon cancer in his maternal grandmother; Diverticulitis in his father; Heart disease (age of onset: 26) in his father; Stroke in his mother. There is no history of Colon polyps.    ROS:  Please see the history of present illness. All other ROS reviewed and negative.     Physical Exam/Data:   Vitals:   05/01/23 1538 05/01/23 1630 05/01/23 1700 05/01/23 1719  BP:  129/67 120/75   Pulse:  (!) 49 61   Resp:  16 17   Temp:    (!) 97.5 F (36.4 C)  TempSrc:    Oral  SpO2:  99%    Weight: 108.9 kg     Height: 6\' 1"  (1.854 m)       Intake/Output Summary (Last 24 hours) at 05/01/2023 1803 Last data filed at 05/01/2023 1705 Gross per 24 hour  Intake 500 ml  Output --  Net 500 ml      05/01/2023    3:38 PM 12/19/2019    1:41 PM 12/04/2019   10:14 AM  Last 3 Weights  Weight (lbs) 240 lb 200 lb 200 lb 6.4 oz  Weight (kg) 108.863 kg 90.719 kg 90.901 kg     Body mass index is 31.66 kg/m.  General:  Uncomfortable-appearing man, lying in bed.  His wife is at the bedside. HEENT: normal Neck: no JVD Vascular: 2+ radial pulses bilaterally Cardiac:  normal S1, S2; RRR; no murmurs, rubs, or gallops. Lungs:  clear to auscultation bilaterally, no wheezing, rhonchi or rales  Abd: soft, nontender, no hepatomegaly  Ext: no edema Musculoskeletal:  No deformities, BUE and BLE strength normal and equal Skin: warm and dry  Neuro:  CNs 2-12 intact, no focal abnormalities noted Psych:  Flat affect   EKG:  The ECG that was done at 1720 was personally reviewed and demonstrates normal sinus rhythm with RBBB, LPFB, possible anteroseptal infarct, and nonspecific ST segment abnormality.  Relevant  CV Studies: Echocardiogram (12/28/2012): Technically difficult study. LVEF normal in size with mild LVH. LVEF 65%. Normal RV size and function. No significant valvular abnormalities.   Laboratory Data:  High Sensitivity Troponin:   Recent Labs  Lab 05/01/23 1320 05/01/23 1521  TROPONINIHS 58* 260*      Chemistry Recent Labs  Lab 05/01/23 1320  NA 136  K 4.8  CL 104  CO2 20*  GLUCOSE 131*  BUN 16  CREATININE 1.32*  CALCIUM 9.1  MG 2.1  GFRNONAA 59*  ANIONGAP 12    Recent Labs  Lab 05/01/23 1320  PROT 6.8  ALBUMIN 3.9  AST 72*  ALT 71*  ALKPHOS 88  BILITOT 1.2   Lipids No results for input(s): "CHOL", "TRIG", "HDL", "LABVLDL", "LDLCALC", "CHOLHDL" in the last 168 hours. Hematology Recent Labs  Lab 05/01/23 1320  WBC 9.9  RBC 4.96  HGB 14.9  HCT 44.3  MCV 89.3  MCH 30.0  MCHC 33.6  RDW 13.2  PLT 211   Thyroid No results for input(s): "TSH", "FREET4" in the last 168 hours. BNPNo results for input(s): "BNP", "PROBNP" in the last 168 hours.  DDimer No results for input(s): "DDIMER" in the last 168 hours.   Radiology/Studies:  CT Angio Chest Aorta W and/or Wo Contrast  Result Date: 05/01/2023 CLINICAL DATA:  Acute aortic syndrome suspected. EXAM: CT ANGIOGRAPHY CHEST WITH CONTRAST TECHNIQUE: Multidetector CT imaging of the chest was performed using the standard protocol during bolus administration of intravenous contrast. Multiplanar CT image reconstructions and MIPs were obtained to evaluate the vascular anatomy. RADIATION DOSE REDUCTION: This exam was performed according to the departmental dose-optimization program which includes automated exposure control, adjustment of the mA and/or kV according to patient size and/or use of iterative reconstruction technique. CONTRAST:  75mL OMNIPAQUE IOHEXOL 350 MG/ML SOLN COMPARISON:  None Available. FINDINGS: Cardiovascular: Preferential opacification of the thoracic aorta. No evidence of thoracic aortic aneurysm or  dissection. Normal heart size. No pericardial effusion. Coronary artery atherosclerotic calcifications. Mediastinum/Nodes: No enlarged mediastinal, hilar, or axillary lymph nodes. Thyroid gland, trachea, and esophagus demonstrate no significant findings. Lungs/Pleura: Lungs are clear. No pleural effusion or pneumothorax. Upper Abdomen: No acute abnormality. Musculoskeletal: No chest wall abnormality. No acute or significant osseous findings. 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 Review of the MIP images confirms the above findings. IMPRESSION: 1. No evidence of thoracic aortic aneurysm or dissection. 2. Lungs are clear. 3. Coronary artery atherosclerotic calcifications. Electronically Signed   By: Larose Hires D.O.   On: 05/01/2023 15:21   DG Chest Portable 1 View  Result Date: 05/01/2023 CLINICAL DATA:  Chest pain.  Code STEMI. EXAM: PORTABLE CHEST 1 VIEW COMPARISON:  Radiographs 12/28/2012.  Abdominal CT 10/05/2013. FINDINGS: 1338 hours. Two views obtained. The heart size and mediastinal contours are normal. The lungs are clear. There is no pleural effusion or pneumothorax. No acute osseous findings are identified. Stable degenerative changes in the thoracic spine. Telemetry leads overlie the chest. IMPRESSION: No evidence of active cardiopulmonary process. Stable degenerative changes of the thoracic spine. Electronically Signed   By: Carey Bullocks M.D.   On: 05/01/2023 13:55     Assessment and Plan:   NSTEMI: Pt presents with sudden onset of chest pain radiating to the left arm.  STEMI team was activited by EMS due to concern for STEMI, though EKG's here have not met STEMI criteria.  However, Mr. Hogen continues to have 4/10 chest pain despite antianginal therapy (fentanyl and IV NTG) consistent with high-risk NSTEMI.  We have discussed continued medical therapy versus urgent cardiac catheterization and have agreed to proceed with  catheterization. -Proceed with cardiac catheterization and possible PCI now. -Continue to titrate NTG gtt as tolerated for relief of chest pain. -Continue heparin infusion. -High-intensity statin therapy. -Defer beta-blocker in the setting of intermittent bradycardia. -Obtain echocardiogram. -Further recommendations to follow catheterization. -Repeat HS-TnI and lactate pending.  Hyperlipidemia: -Check lipid panel with AM labs. -Atorvastatin 80 mg daily.  Transaminitis: Elevated AST/ALT noted today (also present in 2014).  Upper abdomen unremarkable on CTA chest today. -Repeat hepatic function panel in AM. -May need to consider abdominal ultrasound if LFT's continue to rise +/- IM/GI consultation.  Borderline diabetes: Self-reported by patient. -Check hemoglobin A1c.  Informed Consent   Shared Decision Making/Informed Consent The risks [stroke (1 in 1000), death (1 in 1000), kidney failure [usually temporary] (1 in 500), bleeding (1 in 200), allergic reaction [possibly serious] (1 in 200)], benefits (diagnostic support and management of coronary artery disease) and alternatives of a cardiac catheterization were discussed in detail with Mr. Lamountain and he is willing to proceed.    Risk Assessment/Risk Scores:    TIMI Risk Score for Unstable Angina or Non-ST Elevation MI:   The patient's TIMI risk score is 4, which indicates a 20% risk of all cause mortality, new or recurrent myocardial infarction or need for urgent revascularization in the next 14 days.   Code Status: Full Code  Severity of Illness: The appropriate patient status for this patient is INPATIENT. Inpatient status is judged to be reasonable and necessary in order to provide the required intensity of service to ensure the patient's safety. The patient's presenting symptoms, physical exam findings, and initial radiographic and laboratory data in the context of their chronic comorbidities is felt to place them at high risk  for further clinical deterioration. Furthermore, it is not anticipated that the patient will be medically stable for discharge from the hospital within 2 midnights of admission.   * I certify that at the point of admission it is my clinical judgment that the patient will require inpatient hospital care spanning beyond 2 midnights from the point of admission due to high intensity of service, high risk for further deterioration and high frequency of surveillance required.*   For questions or updates, please contact [facility] Please consult [email or web address] for contact info under     Signed, Yvonne Kendall, MD  05/01/2023 6:03 PM

## 2023-05-01 NOTE — Progress Notes (Signed)
Responded to second code STEMI, spoke with patient and wife. Wife requested prayer. Prayed with couple.

## 2023-05-02 ENCOUNTER — Inpatient Hospital Stay (HOSPITAL_COMMUNITY): Payer: Medicare Other

## 2023-05-02 DIAGNOSIS — I472 Ventricular tachycardia, unspecified: Secondary | ICD-10-CM | POA: Diagnosis not present

## 2023-05-02 DIAGNOSIS — I519 Heart disease, unspecified: Secondary | ICD-10-CM | POA: Diagnosis not present

## 2023-05-02 DIAGNOSIS — I214 Non-ST elevation (NSTEMI) myocardial infarction: Secondary | ICD-10-CM | POA: Diagnosis not present

## 2023-05-02 DIAGNOSIS — R7303 Prediabetes: Secondary | ICD-10-CM | POA: Diagnosis not present

## 2023-05-02 DIAGNOSIS — E782 Mixed hyperlipidemia: Secondary | ICD-10-CM

## 2023-05-02 LAB — CBC
HCT: 41.2 % (ref 39.0–52.0)
Hemoglobin: 13.9 g/dL (ref 13.0–17.0)
MCH: 29.6 pg (ref 26.0–34.0)
MCHC: 33.7 g/dL (ref 30.0–36.0)
MCV: 87.7 fL (ref 80.0–100.0)
Platelets: 201 10*3/uL (ref 150–400)
RBC: 4.7 MIL/uL (ref 4.22–5.81)
RDW: 13.5 % (ref 11.5–15.5)
WBC: 11.9 10*3/uL — ABNORMAL HIGH (ref 4.0–10.5)
nRBC: 0 % (ref 0.0–0.2)

## 2023-05-02 LAB — HEPATIC FUNCTION PANEL
ALT: 115 U/L — ABNORMAL HIGH (ref 0–44)
AST: 432 U/L — ABNORMAL HIGH (ref 15–41)
Albumin: 3.5 g/dL (ref 3.5–5.0)
Alkaline Phosphatase: 77 U/L (ref 38–126)
Bilirubin, Direct: 0.1 mg/dL (ref 0.0–0.2)
Indirect Bilirubin: 0.5 mg/dL (ref 0.3–0.9)
Total Bilirubin: 0.6 mg/dL (ref 0.3–1.2)
Total Protein: 6.4 g/dL — ABNORMAL LOW (ref 6.5–8.1)

## 2023-05-02 LAB — BASIC METABOLIC PANEL
Anion gap: 8 (ref 5–15)
BUN: 14 mg/dL (ref 8–23)
CO2: 22 mmol/L (ref 22–32)
Calcium: 8.5 mg/dL — ABNORMAL LOW (ref 8.9–10.3)
Chloride: 105 mmol/L (ref 98–111)
Creatinine, Ser: 1.29 mg/dL — ABNORMAL HIGH (ref 0.61–1.24)
GFR, Estimated: >60 mL/min (ref >60)
Glucose, Bld: 137 mg/dL — ABNORMAL HIGH (ref 70–99)
Potassium: 3.4 mmol/L — ABNORMAL LOW (ref 3.5–5.1)
Sodium: 135 mmol/L (ref 135–145)

## 2023-05-02 LAB — ECHOCARDIOGRAM COMPLETE
AR max vel: 2.73 cm2
AV Area VTI: 2.93 cm2
AV Area mean vel: 2.39 cm2
AV Mean grad: 4 mmHg
AV Peak grad: 5.9 mmHg
Ao pk vel: 1.21 m/s
Area-P 1/2: 3.28 cm2
Est EF: 30
Height: 73 in
S' Lateral: 3.8 cm
Weight: 3668.45 oz

## 2023-05-02 LAB — HIV ANTIBODY (ROUTINE TESTING W REFLEX): HIV Screen 4th Generation wRfx: NONREACTIVE

## 2023-05-02 LAB — MRSA NEXT GEN BY PCR, NASAL: MRSA by PCR Next Gen: NOT DETECTED

## 2023-05-02 LAB — LIPID PANEL
Cholesterol: 143 mg/dL (ref 0–200)
HDL: 48 mg/dL (ref >40)
LDL Cholesterol: 46 mg/dL (ref 0–99)
Total CHOL/HDL Ratio: 3 RATIO
Triglycerides: 247 mg/dL — ABNORMAL HIGH (ref <150)
VLDL: 49 mg/dL — ABNORMAL HIGH (ref 0–40)

## 2023-05-02 LAB — HEMOGLOBIN A1C
Hgb A1c MFr Bld: 6.2 % — ABNORMAL HIGH (ref 4.8–5.6)
Mean Plasma Glucose: 131.24 mg/dL

## 2023-05-02 MED ORDER — SACUBITRIL-VALSARTAN 24-26 MG PO TABS
1.0000 | ORAL_TABLET | Freq: Two times a day (BID) | ORAL | Status: DC
  Filled 2023-05-02 (×2): qty 1

## 2023-05-02 MED ORDER — CHLORHEXIDINE GLUCONATE CLOTH 2 % EX PADS
6.0000 | MEDICATED_PAD | Freq: Every day | CUTANEOUS | Status: DC
  Administered 2023-05-02: 6 via TOPICAL

## 2023-05-02 MED ORDER — SACUBITRIL-VALSARTAN 24-26 MG PO TABS
1.0000 | ORAL_TABLET | Freq: Two times a day (BID) | ORAL | Status: DC
  Administered 2023-05-03: 1 via ORAL
  Filled 2023-05-02: qty 1

## 2023-05-02 MED ORDER — CARVEDILOL 3.125 MG PO TABS
3.1250 mg | ORAL_TABLET | Freq: Two times a day (BID) | ORAL | Status: DC
  Administered 2023-05-02 – 2023-05-03 (×2): 3.125 mg via ORAL
  Filled 2023-05-02 (×2): qty 1

## 2023-05-02 MED ORDER — POTASSIUM CHLORIDE CRYS ER 20 MEQ PO TBCR
60.0000 meq | EXTENDED_RELEASE_TABLET | Freq: Once | ORAL | Status: AC
  Administered 2023-05-02: 60 meq via ORAL
  Filled 2023-05-02: qty 3

## 2023-05-02 MED ORDER — ROSUVASTATIN CALCIUM 20 MG PO TABS
40.0000 mg | ORAL_TABLET | Freq: Every day | ORAL | Status: DC
  Administered 2023-05-02 – 2023-05-03 (×2): 40 mg via ORAL
  Filled 2023-05-02 (×2): qty 2

## 2023-05-02 MED ORDER — PERFLUTREN LIPID MICROSPHERE
1.0000 mL | INTRAVENOUS | Status: AC | PRN
  Administered 2023-05-02: 2 mL via INTRAVENOUS
  Filled 2023-05-02: qty 10

## 2023-05-02 NOTE — Progress Notes (Signed)
Rounding Note    Patient Name: Connor Haas. Date of Encounter: 05/02/2023  [facility] Cardiologist: None   Subjective   Feeling better.  No significant chest discomfort.  No dyspnea at rest.  Uncomfortable position in bed.  Trouble sleeping. Gets teary-eyed when talking about the fact that he had a heart attack, plans for work/retirement.  Inpatient Medications    Scheduled Meds:  aspirin  81 mg Oral Pre-Cath   aspirin EC  81 mg Oral Daily   atorvastatin  80 mg Oral QHS   Chlorhexidine Gluconate Cloth  6 each Topical Daily   enoxaparin (LOVENOX) injection  40 mg Subcutaneous Q24H   sodium chloride flush  3 mL Intravenous Q12H   ticagrelor  90 mg Oral BID   Continuous Infusions:  sodium chloride     PRN Meds: sodium chloride, acetaminophen, ALPRAZolam, nitroGLYCERIN, ondansetron (ZOFRAN) IV, mouth rinse, sodium chloride flush   Vital Signs    Vitals:   05/02/23 0700 05/02/23 0735 05/02/23 0800 05/02/23 0900  BP: 111/68  132/74 (!) 141/91  Pulse: 67  65 68  Resp: 17  17 15   Temp:  98.2 F (36.8 C)    TempSrc:  Oral    SpO2: 95%  96% 96%  Weight:      Height:        Intake/Output Summary (Last 24 hours) at 05/02/2023 1017 Last data filed at 05/02/2023 0800 Gross per 24 hour  Intake 1157.11 ml  Output 2240 ml  Net -1082.89 ml      05/01/2023    7:26 PM 05/01/2023    3:38 PM 12/19/2019    1:41 PM  Last 3 Weights  Weight (lbs) 229 lb 4.5 oz 240 lb 200 lb  Weight (kg) 104 kg 108.863 kg 90.719 kg      Telemetry    Several episodes of slow VT/AIVR overnight, gradually decreasing in frequency- Personally Reviewed  ECG    Normal sinus rhythm, right bundle branch block, Q waves leads V1-V4 and sharp Q waves in the inferior leads on subtle residual ST segment changes in V2-V3 - Personally Reviewed  Physical Exam  Appears comfortable GEN: No acute distress.   Neck: No JVD Cardiac: RRR, widely split S2, no murmurs, rubs, or gallops.   Respiratory: Clear to auscultation bilaterally. GI: Soft, nontender, non-distended  MS: No edema; No deformity. Neuro:  Nonfocal  Psych: Seems a little depressed, teary-eyed  Labs    High Sensitivity Troponin:   Recent Labs  Lab 05/01/23 1320 05/01/23 1521 05/01/23 1741  TROPONINIHS 58* 260* 3,926*     Chemistry Recent Labs  Lab 05/01/23 1320 05/02/23 0257  NA 136 135  K 4.8 3.4*  CL 104 105  CO2 20* 22  GLUCOSE 131* 137*  BUN 16 14  CREATININE 1.32* 1.29*  CALCIUM 9.1 8.5*  MG 2.1  --   PROT 6.8 6.4*  ALBUMIN 3.9 3.5  AST 72* 432*  ALT 71* 115*  ALKPHOS 88 77  BILITOT 1.2 0.6  GFRNONAA 59* >60  ANIONGAP 12 8    Lipids  Recent Labs  Lab 05/02/23 0257  CHOL 143  TRIG 247*  HDL 48  LDLCALC 46  CHOLHDL 3.0    Hematology Recent Labs  Lab 05/01/23 1320 05/02/23 0257  WBC 9.9 11.9*  RBC 4.96 4.70  HGB 14.9 13.9  HCT 44.3 41.2  MCV 89.3 87.7  MCH 30.0 29.6  MCHC 33.6 33.7  RDW 13.2 13.5  PLT 211 201  Thyroid No results for input(s): "TSH", "FREET4" in the last 168 hours.  BNPNo results for input(s): "BNP", "PROBNP" in the last 168 hours.  DDimer No results for input(s): "DDIMER" in the last 168 hours.   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  There are also 20-30% ostial LAD and 30% mid LCx stenoses.  No significant disease observed in nondominant RCA. Severely reduced left ventricular systolic function (LVEF 35-35% with mid/apical anterior and apical inferior akinesis). Mildly elevated left ventricular filling pressure (LVEDP 24 mmHg). Successful PCI to proximal/mid LAD using Synergy XD 3.0 x 28 mm drug-eluting stent with 0% residual stenosis and TIMI-2 flow in the distal LAD (suspect sluggish distal flow is due to microvascular dysfunction). Recommendations: Admit to 2-Heart ICU. Continue cangrelor infusion for 2 hours. Dual  antiplatelet therapy with aspirin and ticagrelor for at least 12 months. Gentle diuresis. Initiate goal-directed medical therapy for acute HFrEF due to ischemic cardiomyopathy tomorrow as heart rate, blood pressure, and renal function allow. Follow-up echocardiogram. Aggressive secondary prevention of coronary artery disease. Yvonne Kendall, MD [facility]  CT Angio Chest Aorta W and/or Wo Contrast  Result Date: 05/01/2023 CLINICAL DATA:  Acute aortic syndrome suspected. EXAM: CT ANGIOGRAPHY CHEST WITH CONTRAST TECHNIQUE: Multidetector CT imaging of the chest was performed using the standard protocol during bolus administration of intravenous contrast. Multiplanar CT image reconstructions and MIPs were obtained to evaluate the vascular anatomy. RADIATION DOSE REDUCTION: This exam was performed according to the departmental dose-optimization program which includes automated exposure control, adjustment of the mA and/or kV according to patient size and/or use of iterative reconstruction technique. CONTRAST:  75mL OMNIPAQUE IOHEXOL 350 MG/ML SOLN COMPARISON:  None Available. FINDINGS: Cardiovascular: Preferential opacification of the thoracic aorta. No evidence of thoracic aortic aneurysm or dissection. Normal heart size. No pericardial effusion. Coronary artery atherosclerotic calcifications. Mediastinum/Nodes: No enlarged mediastinal, hilar, or axillary lymph nodes. Thyroid gland, trachea, and esophagus demonstrate no significant findings. Lungs/Pleura: Lungs are clear. No pleural effusion or pneumothorax. Upper Abdomen: No acute abnormality. Musculoskeletal: No chest wall abnormality. No acute or significant osseous findings. 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 Review of the MIP images confirms the above findings. IMPRESSION: 1. No evidence of thoracic aortic aneurysm or dissection. 2. Lungs are clear. 3. Coronary artery atherosclerotic calcifications.  Electronically Signed   By: Larose Hires D.O.   On: 05/01/2023 15:21   DG Chest Portable 1 View  Result Date: 05/01/2023 CLINICAL DATA:  Chest pain.  Code STEMI. EXAM: PORTABLE CHEST 1 VIEW COMPARISON:  Radiographs 12/28/2012.  Abdominal CT 10/05/2013. FINDINGS: 1338 hours. Two views obtained. The heart size and mediastinal contours are normal. The lungs are clear. There is no pleural effusion or pneumothorax. No acute osseous findings are identified. Stable degenerative changes in the thoracic spine. Telemetry leads overlie the chest. IMPRESSION: No evidence of active cardiopulmonary process. Stable degenerative changes of the thoracic spine. Electronically Signed   By: Carey Bullocks M.D.   On: 05/01/2023 13:55    Cardiac Studies   iagnostic Dominance: Left  Intervention    Implants   Permanent Stent  Synergy Xd 3.0x28      Patient Profile     67 y.o. male with HTN, hypercholesterolemia, anxiety and chronic pain presenting with chest pain, new RBBB, underwent urgent cardiac catheterization for NSTEMI and found to have total occlusion of the proximal LAD artery.  He has a history strongly  suggestive of vasovagal syncope.  Assessment & Plan    Anterior MI: Not clearly meeting STEMI criteria on admission, but found to have total occlusion of the LAD artery.  Now status post angioplasty/stent with improvement in symptoms.  Discussed mandatory dual antiplatelet therapy for the next 12 months.  Strongly encouraged cardiac rehab.  He works only part-time and should be able to accommodate some of his work schedule. LV dysfunction: Clear how much is due to scar versus stunning.  BP mildly elevated at 24 mmHg at the time of.  Currently without any signs or symptoms of heart failure.  I discussed the possible development of shortness of breath/orthopnea/PND and how he should respond to these.  Also reviewed the fact that we will start him a cocktail of medicines to help improve heart pumping  function.  Will start low-dose carvedilol.  Also start Entresto, but delay for this afternoon since he was taking lisinopril for blood pressure as recently as yesterday.  Re-evaluate LVEF after a few weeks.  Could benefit from SGLT2 inhibitor and spironolactone if he develops clinical heart failure. VT: Had multiple episodes of relatively slow VT/AIVR following revascularization.  These are gradually decreasing in frequency.  Asymptomatic. HTN: Very well-controlled.  He should be able to tolerate at least low-dose Entresto and carvedilol. HLP: Lipid parameters are actually pretty good on his current statin prescription, although he still has mild residual hypertriglyceridemia. Prediabetes: hemoglobin A1c 6.2% without medications.  Encourage more physical activity, exercise. If he does develop full-blown diabetes mellitus, SGLT2 inhibitor would be the first choice.  For questions or updates, please contact [facility] Please consult [email or web address] for contact info under        Signed, Thurmon Fair, MD  05/02/2023, 10:17 AM

## 2023-05-03 ENCOUNTER — Other Ambulatory Visit (HOSPITAL_COMMUNITY): Payer: Self-pay

## 2023-05-03 ENCOUNTER — Encounter: Payer: Self-pay | Admitting: Cardiology

## 2023-05-03 ENCOUNTER — Encounter (HOSPITAL_COMMUNITY): Payer: Self-pay | Admitting: Internal Medicine

## 2023-05-03 DIAGNOSIS — E7800 Pure hypercholesterolemia, unspecified: Secondary | ICD-10-CM

## 2023-05-03 DIAGNOSIS — I2583 Coronary atherosclerosis due to lipid rich plaque: Secondary | ICD-10-CM

## 2023-05-03 DIAGNOSIS — I255 Ischemic cardiomyopathy: Secondary | ICD-10-CM | POA: Diagnosis not present

## 2023-05-03 DIAGNOSIS — I2510 Atherosclerotic heart disease of native coronary artery without angina pectoris: Secondary | ICD-10-CM

## 2023-05-03 DIAGNOSIS — E785 Hyperlipidemia, unspecified: Secondary | ICD-10-CM | POA: Insufficient documentation

## 2023-05-03 DIAGNOSIS — I214 Non-ST elevation (NSTEMI) myocardial infarction: Secondary | ICD-10-CM | POA: Diagnosis not present

## 2023-05-03 DIAGNOSIS — I10 Essential (primary) hypertension: Secondary | ICD-10-CM | POA: Insufficient documentation

## 2023-05-03 LAB — BASIC METABOLIC PANEL
Anion gap: 9 (ref 5–15)
BUN: 17 mg/dL (ref 8–23)
CO2: 22 mmol/L (ref 22–32)
Calcium: 8.5 mg/dL — ABNORMAL LOW (ref 8.9–10.3)
Chloride: 104 mmol/L (ref 98–111)
Creatinine, Ser: 1.3 mg/dL — ABNORMAL HIGH (ref 0.61–1.24)
GFR, Estimated: >60 mL/min (ref >60)
Glucose, Bld: 116 mg/dL — ABNORMAL HIGH (ref 70–99)
Potassium: 4.2 mmol/L (ref 3.5–5.1)
Sodium: 135 mmol/L (ref 135–145)

## 2023-05-03 LAB — MAGNESIUM: Magnesium: 2 mg/dL (ref 1.7–2.4)

## 2023-05-03 MED ORDER — CARVEDILOL 3.125 MG PO TABS
3.1250 mg | ORAL_TABLET | Freq: Two times a day (BID) | ORAL | 11 refills | Status: DC
  Filled 2023-05-03: qty 60, 30d supply, fill #0
  Filled 2023-05-30 – 2023-05-31 (×2): qty 60, 30d supply, fill #1
  Filled 2023-06-28: qty 60, 30d supply, fill #2
  Filled 2023-07-27: qty 60, 30d supply, fill #3
  Filled 2023-08-28: qty 60, 30d supply, fill #4

## 2023-05-03 MED ORDER — DAPAGLIFLOZIN PROPANEDIOL 10 MG PO TABS: 10.0000 mg | ORAL_TABLET | Freq: Every day | ORAL | Status: DC

## 2023-05-03 MED ORDER — ROSUVASTATIN CALCIUM 40 MG PO TABS
40.0000 mg | ORAL_TABLET | Freq: Every day | ORAL | 3 refills | Status: DC
  Filled 2023-05-03: qty 90, 90d supply, fill #0
  Filled 2023-07-27: qty 90, 90d supply, fill #1

## 2023-05-03 MED ORDER — ASPIRIN 81 MG PO TBEC
81.0000 mg | DELAYED_RELEASE_TABLET | Freq: Every day | ORAL | 3 refills | Status: DC
  Filled 2023-05-03: qty 120, 120d supply, fill #0
  Filled 2023-08-28: qty 120, 120d supply, fill #1

## 2023-05-03 MED ORDER — SACUBITRIL-VALSARTAN 24-26 MG PO TABS
1.0000 | ORAL_TABLET | Freq: Two times a day (BID) | ORAL | 11 refills | Status: DC
Start: 2023-05-03 — End: 2023-05-10
  Filled 2023-05-03: qty 60, 30d supply, fill #0

## 2023-05-03 MED ORDER — NITROGLYCERIN 0.4 MG SL SUBL
0.4000 mg | SUBLINGUAL_TABLET | SUBLINGUAL | 1 refills | Status: DC | PRN
  Filled 2023-05-03: qty 25, 7d supply, fill #0

## 2023-05-03 MED ORDER — TICAGRELOR 90 MG PO TABS
90.0000 mg | ORAL_TABLET | Freq: Two times a day (BID) | ORAL | 11 refills | Status: DC
Start: 2023-05-03 — End: 2023-12-27
  Filled 2023-05-03: qty 60, 30d supply, fill #0
  Filled 2023-05-30 – 2023-05-31 (×2): qty 60, 30d supply, fill #1
  Filled 2023-06-28: qty 60, 30d supply, fill #2
  Filled 2023-07-27: qty 60, 30d supply, fill #3
  Filled 2023-08-28: qty 60, 30d supply, fill #4

## 2023-05-03 MED ORDER — EMPAGLIFLOZIN 10 MG PO TABS
10.0000 mg | ORAL_TABLET | Freq: Every day | ORAL | 11 refills | Status: DC
Start: 2023-05-03 — End: 2023-06-08
  Filled 2023-05-03: qty 30, 30d supply, fill #0
  Filled 2023-05-30 – 2023-05-31 (×2): qty 30, 30d supply, fill #1

## 2023-05-03 MED ORDER — EMPAGLIFLOZIN 10 MG PO TABS
10.0000 mg | ORAL_TABLET | Freq: Every day | ORAL | Status: DC
  Administered 2023-05-03: 10 mg via ORAL
  Filled 2023-05-03: qty 1

## 2023-05-03 NOTE — Plan of Care (Signed)
  Problem: Activity: Goal: Ability to tolerate increased activity will improve Outcome: Progressing   Problem: Cardiac: Goal: Ability to achieve and maintain adequate cardiovascular perfusion will improve Outcome: Progressing   Problem: Health Behavior/Discharge Planning: Goal: Ability to safely manage health-related needs after discharge will improve Outcome: Progressing   Problem: Cardiovascular: Goal: Vascular access site(s) Level 0-1 will be maintained Outcome: Completed/Met

## 2023-05-03 NOTE — TOC Benefit Eligibility Note (Addendum)
Pharmacy Patient Advocate Encounter  Insurance verification completed.    The patient is insured through [facility]   Ran test claim for Brilinta 90 mg and the current 30 day co-pay is $47.00.  Ran test claim for Entresto 24-26 mg and the current 30 day co-pay is $47.00.  Ran test claim for Farxiga 10 mg and the current 30 day co-pay is $47.00.  Ran test claim for Jardiance 10 mg and the current 30 day co-pay is $47.00.  Ran test claim for prasugrel (Effient) 10 mg and the current 30 day co-pay is $41.82.  This test claim was processed through [facility]- copay amounts may vary at other pharmacies due to pharmacy/plan contracts, or as the patient moves through the different stages of their insurance plan.    Roland Earl, CPHT Pharmacy Patient Advocate Specialist [facility] Patient Advocate Team Direct Number: [phone]  Fax: [phone]

## 2023-05-03 NOTE — Progress Notes (Signed)
CARDIAC REHAB PHASE I   Pt in room walking independently. States he in tired of sitting around. Pt ambulating well, with no CP, SOB or dizziness. Post MI/stent education including site care, restrictions, risk factors, site care, antiplatelet therapy importance, MI booklet, exercise guidelines, NTG use, heart healthy diet and CRP2 reviewed. All questions and concerns addressed. Will refer to [facility] for CRP2. Plan for possible discharge later today.   [id number]  Woodroe Chen, RN BSN 05/03/2023 9:40 AM

## 2023-05-03 NOTE — Discharge Summary (Addendum)
Discharge Summary    Patient ID: Connor Haas. MRN: [id number]; DOB: [birth year]  Admit date: 05/01/2023 Discharge date: 05/03/2023  PCP:  [facility]   [facility] Providers Cardiologist:  Yvonne Kendall, MD    Discharge Diagnoses    Principal Problem:   NSTEMI (non-ST elevated myocardial infarction) [facility]) Active Problems:   Hyperlipidemia   Coronary artery disease due to lipid rich plaque   Ischemic cardiomyopathy   HTN (hypertension)    Diagnostic Studies/Procedures    Echocardiogram 05/02/2023  1. Extremely poor acoustic windows limit study, even with Definity use.  There is akinesis of the mid/distal inferior, inferoseptal and apical  walls. Distal anterior wall is hypokinetic. Left ventricular ejection  fraction, by estimation, is 30%. The left   ventricle has severely decreased function. There is mild left ventricular  hypertrophy.   2. Right ventricular systolic function is normal. The right ventricular  size is normal.   3. Trivial mitral valve regurgitation.   4. The aortic valve is normal in structure. Aortic valve regurgitation is  not visualized.   5. The inferior vena cava is normal in size with greater than 50%  respiratory variability, suggesting right atrial pressure of 3 mmHg.   Left heart catheterization 05/01/2023 Conclusions: Severe single-vessel coronary artery with thrombotic occlusion of proximal/mid LAD.  There are also 20-30% ostial LAD and 30% mid LCx stenoses.  No significant disease observed in nondominant RCA. Severely reduced left ventricular systolic function (LVEF 35-35% with mid/apical anterior and apical inferior akinesis). Mildly elevated left ventricular filling pressure (LVEDP 24 mmHg). Successful PCI to proximal/mid LAD using Synergy XD 3.0 x 28 mm drug-eluting stent with 0% residual stenosis and TIMI-2 flow in the distal LAD (suspect sluggish distal flow is due to microvascular dysfunction).    Recommendations: Admit to 2-Heart ICU. Continue cangrelor infusion for 2 hours. Dual antiplatelet therapy with aspirin and ticagrelor for at least 12 months. Gentle diuresis. Initiate goal-directed medical therapy for acute HFrEF due to ischemic cardiomyopathy tomorrow as heart rate, blood pressure, and renal function allow. Follow-up echocardiogram. Aggressive secondary prevention of coronary artery disease. _____________   History of Present Illness     Connor Haas. is a 67 y.o. male with hypertension, hyperlipidemia, prediabetic, anxiety, chronic pain, vasovagal syncrope who was admitted on 05/01/2023 for further evaluation of chest pain.  After taking a shower patient developed sudden onset of substernal chest pain radiating to the left arm that persisted for several minutes.  911 was called and he was advised to take 324 mg of aspirin then he was also given 3 doses of sublingual nitroglycerin when EMS arrived.  EKG showed new onset of right bundle branch block and concerns for possible STEMI.  Initially code STEMI was called.  However, once he arrived he was evaluated, he did not meet STEMI criteria. CTA chest was negative for acute aortic pathology and PE. CT did show high-grade disease in the mid LAD. Patient was taken to cardiac cath lab for further evaluation.   Hospital Course     Consultants:    NSTEMI Did not meet STEMI criteria on admission.  Troponins peaked at almost 4000.  Cardiac catheterization showed CTO of LAD with subsequent DES to proximal/mid LAD.  He had residual disease 20 to 30% ostial LAD and 30% mid left circumflex stenosis.  Clean RCA.  Plan for DAPT with aspirin and ticagrelor for at least 12 months and up titration with new onset of HFrEF. Continue aspirin  81 mg, Brilinta 90 mg twice daily, rosuvastatin 40 mg, carvedilol 3.125 mg Patient reports no significant barriers to cost. Lpa pending   New onset HFrEF Ischemic cardiomyopathy No obvious signs of  heart failure currently and euvolemic.  I discussed at great lengths the management and goal for GDMT.  Also encouraged daily weights, monitoring of symptoms, diet, exercise.  Echocardiogram showed LVEF of 30% with normal RV function.  He had hypokinesis of the mid/distal, inferior, inferoseptal and apical walls. We have started him on carvedilol 3.125 mg, Entresto 24-26 mg twice daily, Jardiance 10 mg.  Continue to up titrate dosing every 2 weeks as he tolerates. Repeat echocardiogram in 4 to 6 weeks. Will repeat labs in 1 week  Perioperative VT Had multiple episodes of relatively slow VT post revascularization.  He was asymptomatic during this time and no recurrences on telemetry.  Continue to monitor outpatient.  Hypertension Well-controlled on above regimen.  Hyperlipidemia LDL 46, triglycerides 247.  We have started rosuvastatin 40 mg daily.  Repeat lipid panel in 6 weeks.  AKI Unclear baseline renal function.  Creatinine here has been stable at around 1.3.  Repeat lab work in 1 week.  Prediabetic A1c 6.2% without medications.  Continue to encourage lifestyle modifications.  Already have added SGLT2 inhibitor.  Patient seen and evaluated by Dr. Mayford Knife and myself and deemed stable for discharge.  Right radial catheter site inspected with no acute complications.Work note has been written. Lab work to be completed in one week. Follow up has been made.   Did the patient have an acute coronary syndrome (MI, NSTEMI, STEMI, etc) this admission?:  Yes                               AHA/ACC Clinical Performance & Quality Measures: Aspirin prescribed? - Yes ADP Receptor Inhibitor (Plavix/Clopidogrel, Brilinta/Ticagrelor or Effient/Prasugrel) prescribed (includes medically managed patients)? - Yes Beta Blocker prescribed? - Yes High Intensity Statin (Lipitor 40-80mg  or Crestor 20-40mg ) prescribed? - Yes EF assessed during THIS hospitalization? - Yes For EF <40%, was ACEI/ARB prescribed? -  Yes For EF <40%, Aldosterone Antagonist (Spironolactone or Eplerenone) prescribed? - No - Reason:  Titrate outpatient  Cardiac Rehab Phase II ordered (including medically managed patients)? - Yes       The patient will be scheduled for a TOC follow up appointment in 14 days.  A message has been sent to the [facility] and Scheduling Pool at the office where the patient should be seen for follow up.  _____________  Discharge Vitals Blood pressure 123/73, pulse 72, temperature 98.6 F (37 C), temperature source Oral, resp. rate 16, height 6\' 1"  (1.854 m), weight 102.1 kg, SpO2 95%.  Filed Weights   05/01/23 1538 05/01/23 1926 05/03/23 0330  Weight: 108.9 kg 104 kg 102.1 kg   Physical Exam Constitutional:      Appearance: Normal appearance.  Cardiovascular:     Rate and Rhythm: Normal rate and regular rhythm.     Pulses: Normal pulses.     Heart sounds: Normal heart sounds.  Pulmonary:     Effort: Pulmonary effort is normal.     Breath sounds: Normal breath sounds.  Abdominal:     General: Abdomen is flat.     Palpations: Abdomen is soft.  Skin:    General: Skin is warm and dry.     Capillary Refill: Capillary refill takes less than 2 seconds.  Neurological:  General: No focal deficit present.     Mental Status: He is alert and oriented to person, place, and time. Mental status is at baseline.  Psychiatric:        Mood and Affect: Mood normal.    Right radial cath sight with no signs of acute complications. No discharge, tenderness, significant bruising Labs & Radiologic Studies    CBC Recent Labs    05/01/23 1320 05/02/23 0257  WBC 9.9 11.9*  NEUTROABS 6.6  --   HGB 14.9 13.9  HCT 44.3 41.2  MCV 89.3 87.7  PLT 211 201   Basic Metabolic Panel Recent Labs    08/07/24 1320 05/02/23 0257 05/03/23 0125  NA 136 135 135  K 4.8 3.4* 4.2  CL 104 105 104  CO2 20* 22 22  GLUCOSE 131* 137* 116*  BUN 16 14 17   CREATININE 1.32* 1.29* 1.30*  CALCIUM 9.1 8.5* 8.5*  MG  2.1  --  2.0   Liver Function Tests Recent Labs    05/01/23 1320 05/02/23 0257  AST 72* 432*  ALT 71* 115*  ALKPHOS 88 77  BILITOT 1.2 0.6  PROT 6.8 6.4*  ALBUMIN 3.9 3.5   No results for input(s): "LIPASE", "AMYLASE" in the last 72 hours. High Sensitivity Troponin:   Recent Labs  Lab 05/01/23 1320 05/01/23 1521 05/01/23 1741  TROPONINIHS 58* 260* 3,926*    BNP Invalid input(s): "POCBNP" D-Dimer No results for input(s): "DDIMER" in the last 72 hours. Hemoglobin A1C Recent Labs    05/02/23 0257  HGBA1C 6.2*   Fasting Lipid Panel Recent Labs    05/02/23 0257  CHOL 143  HDL 48  LDLCALC 46  TRIG 247*  CHOLHDL 3.0   Thyroid Function Tests No results for input(s): "TSH", "T4TOTAL", "T3FREE", "THYROIDAB" in the last 72 hours.  Invalid input(s): "FREET3" _____________  ECHOCARDIOGRAM COMPLETE  Result Date: 05/02/2023    ECHOCARDIOGRAM REPORT   Patient Name:   Connor Haas. Date of Exam: 05/02/2023 Medical Rec #:  [id number]          Height:       73.0 in Accession #:    [id number]         Weight:       229.3 lb Date of Birth:  [birth year]          BSA:          2.280 m Patient Age:    67 years           BP:           123/72 mmHg Patient Gender: M                  HR:           69 bpm. Exam Location:  Inpatient Procedure: 2D Echo, Cardiac Doppler, Color Doppler and Intracardiac            Opacification Agent Indications:    NSTEMI  History:        Patient has no prior history of Echocardiogram examinations.                 Acute MI and CAD.  Sonographer:    Darlys Gales Referring Phys: [phone] MIHAI CROITORU IMPRESSIONS  1. Extremely poor acoustic windows limit study, even with Definity use. There is akinesis of the mid/distal inferior, inferoseptal and apical walls. Distal anterior wall is hypokinetic. Left ventricular ejection fraction, by estimation, is 30%. The left  ventricle has severely  decreased function. There is mild left ventricular hypertrophy.  2. Right  ventricular systolic function is normal. The right ventricular size is normal.  3. Trivial mitral valve regurgitation.  4. The aortic valve is normal in structure. Aortic valve regurgitation is not visualized.  5. The inferior vena cava is normal in size with greater than 50% respiratory variability, suggesting right atrial pressure of 3 mmHg. FINDINGS  Left Ventricle: Extremely poor acoustic windows limit study, even with Definity use. There is akinesis of the mid/distal inferior, inferoseptal and apical walls. Distal anterior wall is hypokinetic. Left ventricular ejection fraction, by estimation, is 30%. The left ventricle has severely decreased function. Definity contrast agent was given IV to delineate the left ventricular endocardial borders. The left ventricular internal cavity size was normal in size. There is mild left ventricular hypertrophy. Right Ventricle: The right ventricular size is normal. Right vetricular wall thickness was not assessed. Right ventricular systolic function is normal. Left Atrium: Left atrial size was normal in size. Right Atrium: Right atrial size was normal in size. Pericardium: There is no evidence of pericardial effusion. Mitral Valve: There is mild thickening of the mitral valve leaflet(s). Trivial mitral valve regurgitation. Tricuspid Valve: The tricuspid valve is normal in structure. Tricuspid valve regurgitation is trivial. Aortic Valve: The aortic valve is normal in structure. Aortic valve regurgitation is not visualized. Aortic valve mean gradient measures 4.0 mmHg. Aortic valve peak gradient measures 5.9 mmHg. Aortic valve area, by VTI measures 2.93 cm. Pulmonic Valve: The pulmonic valve was grossly normal. Pulmonic valve regurgitation is trivial. Aorta: The aortic root and ascending aorta are structurally normal, with no evidence of dilitation. Venous: The inferior vena cava is normal in size with greater than 50% respiratory variability, suggesting right atrial pressure  of 3 mmHg. IAS/Shunts: No atrial level shunt detected by color flow Doppler.  LEFT VENTRICLE PLAX 2D LVIDd:         4.90 cm   Diastology LVIDs:         3.80 cm   LV e' medial:    5.66 cm/s LV PW:         1.00 cm   LV E/e' medial:  14.3 LV IVS:        1.20 cm   LV e' lateral:   3.81 cm/s LVOT diam:     2.00 cm   LV E/e' lateral: 21.3 LV SV:         65 LV SV Index:   28 LVOT Area:     3.14 cm  RIGHT VENTRICLE             IVC RV S prime:     13.70 cm/s  IVC diam: 2.90 cm TAPSE (M-mode): 1.6 cm LEFT ATRIUM             Index        RIGHT ATRIUM           Index LA Vol (A2C):   34.1 ml 14.95 ml/m  RA Area:     15.80 cm LA Vol (A4C):   44.8 ml 19.65 ml/m  RA Volume:   41.90 ml  18.38 ml/m LA Biplane Vol: 41.1 ml 18.02 ml/m  AORTIC VALVE AV Area (Vmax):    2.73 cm AV Area (Vmean):   2.39 cm AV Area (VTI):     2.93 cm AV Vmax:           121.00 cm/s AV Vmean:          90.700 cm/s  AV VTI:            0.221 m AV Peak Grad:      5.9 mmHg AV Mean Grad:      4.0 mmHg LVOT Vmax:         105.00 cm/s LVOT Vmean:        69.100 cm/s LVOT VTI:          0.206 m LVOT/AV VTI ratio: 0.93  AORTA Ao Root diam: 3.50 cm Ao Asc diam:  3.20 cm MITRAL VALVE               TRICUSPID VALVE MV Area (PHT): 3.28 cm    TR Peak grad:   13.2 mmHg MV Decel Time: 231 msec    TR Vmax:        182.00 cm/s MV E velocity: 81.00 cm/s MV A velocity: 62.90 cm/s  SHUNTS MV E/A ratio:  1.29        Systemic VTI:  0.21 m                            Systemic Diam: 2.00 cm Dietrich Pates MD Electronically signed by Dietrich Pates MD Signature Date/Time: 05/02/2023/4:12:51 PM    Final    CARDIAC CATHETERIZATION  Result Date: 05/01/2023 Conclusions: Severe single-vessel coronary artery with thrombotic occlusion of proximal/mid LAD.  There are also 20-30% ostial LAD and 30% mid LCx stenoses.  No significant disease observed in nondominant RCA. Severely reduced left ventricular systolic function (LVEF 35-35% with mid/apical anterior and apical inferior akinesis). Mildly  elevated left ventricular filling pressure (LVEDP 24 mmHg). Successful PCI to proximal/mid LAD using Synergy XD 3.0 x 28 mm drug-eluting stent with 0% residual stenosis and TIMI-2 flow in the distal LAD (suspect sluggish distal flow is due to microvascular dysfunction). Recommendations: Admit to 2-Heart ICU. Continue cangrelor infusion for 2 hours. Dual antiplatelet therapy with aspirin and ticagrelor for at least 12 months. Gentle diuresis. Initiate goal-directed medical therapy for acute HFrEF due to ischemic cardiomyopathy tomorrow as heart rate, blood pressure, and renal function allow. Follow-up echocardiogram. Aggressive secondary prevention of coronary artery disease. Yvonne Kendall, MD [facility]  CT Angio Chest Aorta W and/or Wo Contrast  Result Date: 05/01/2023 CLINICAL DATA:  Acute aortic syndrome suspected. EXAM: CT ANGIOGRAPHY CHEST WITH CONTRAST TECHNIQUE: Multidetector CT imaging of the chest was performed using the standard protocol during bolus administration of intravenous contrast. Multiplanar CT image reconstructions and MIPs were obtained to evaluate the vascular anatomy. RADIATION DOSE REDUCTION: This exam was performed according to the departmental dose-optimization program which includes automated exposure control, adjustment of the mA and/or kV according to patient size and/or use of iterative reconstruction technique. CONTRAST:  75mL OMNIPAQUE IOHEXOL 350 MG/ML SOLN COMPARISON:  None Available. FINDINGS: Cardiovascular: Preferential opacification of the thoracic aorta. No evidence of thoracic aortic aneurysm or dissection. Normal heart size. No pericardial effusion. Coronary artery atherosclerotic calcifications. Mediastinum/Nodes: No enlarged mediastinal, hilar, or axillary lymph nodes. Thyroid gland, trachea, and esophagus demonstrate no significant findings. Lungs/Pleura: Lungs are clear. No pleural effusion or pneumothorax. Upper Abdomen: No acute abnormality. Musculoskeletal:  No chest wall abnormality. No acute or significant osseous findings. 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 Review of the MIP images confirms the above findings. IMPRESSION: 1. No evidence of thoracic aortic aneurysm or dissection. 2. Lungs are clear. 3. Coronary artery atherosclerotic calcifications. Electronically Signed   By: Larose Hires D.O.   On: 05/01/2023  15:21   DG Chest Portable 1 View  Result Date: 05/01/2023 CLINICAL DATA:  Chest pain.  Code STEMI. EXAM: PORTABLE CHEST 1 VIEW COMPARISON:  Radiographs 12/28/2012.  Abdominal CT 10/05/2013. FINDINGS: 1338 hours. Two views obtained. The heart size and mediastinal contours are normal. The lungs are clear. There is no pleural effusion or pneumothorax. No acute osseous findings are identified. Stable degenerative changes in the thoracic spine. Telemetry leads overlie the chest. IMPRESSION: No evidence of active cardiopulmonary process. Stable degenerative changes of the thoracic spine. Electronically Signed   By: Carey Bullocks M.D.   On: 05/01/2023 13:55   Disposition   Pt is being discharged home today in good condition.  Follow-up Plans & Appointments     Follow-up Information     Furth, Cadence H, PA-C Follow up.   Specialty: Cardiology Why: Monday May 10, 2023 Arrive by 8:10 AM Appt at 8:25 AM (25 min) Contact information: [address] [place] [address] [phone]                Discharge Instructions     Amb Referral to Cardiac Rehabilitation   Complete by: As directed    Diagnosis:  Coronary Stents NSTEMI     After initial evaluation and assessments completed: Virtual Based Care may be provided alone or in conjunction with Phase 2 Cardiac Rehab based on patient barriers.: Yes   Intensive Cardiac Rehabilitation (ICR) [facility] location only OR Traditional Cardiac Rehabilitation (TCR) *If criteria for ICR are not met will enroll in TCR [facility] only):  Yes   Diet - low sodium heart healthy   Complete by: As directed    Discharge instructions   Complete by: As directed    Radial Site Care Refer to this sheet in the next few weeks. These instructions provide you with information on caring for yourself after your procedure. Your caregiver may also give you more specific instructions. Your treatment has been planned according to current medical practices, but problems sometimes occur. Call your caregiver if you have any problems or questions after your procedure. HOME CARE INSTRUCTIONS You may shower the day after the procedure. Remove the bandage (dressing) and gently wash the site with plain soap and water. Gently pat the site dry.  Do not apply powder or lotion to the site.  Do not submerge the affected site in water for 3 to 5 days.  Inspect the site at least twice daily.  Do not flex or bend the affected arm for 24 hours.  No lifting over 5 pounds (2.3 kg) for 5 days after your procedure.  Do not drive home if you are discharged the same day of the procedure. Have someone else drive you.  You may drive 24 hours after the procedure unless otherwise instructed by your caregiver.  What to expect: Any bruising will usually fade within 1 to 2 weeks.  Blood that collects in the tissue (hematoma) may be painful to the touch. It should usually decrease in size and tenderness within 1 to 2 weeks.  SEEK IMMEDIATE MEDICAL CARE IF: You have unusual pain at the radial site.  You have redness, warmth, swelling, or pain at the radial site.  You have drainage (other than a small amount of blood on the dressing).  You have chills.  You have a fever or persistent symptoms for more than 72 hours.  You have a fever and your symptoms suddenly get worse.  Your arm becomes pale, cool, tingly, or numb.  You have heavy bleeding from the site. Hold pressure on the site.   Increase activity slowly   Complete by: As directed         Discharge Medications    Allergies as of 05/03/2023       Reactions   Penicillins Nausea Only   Sweating & a high fever   Penicillins    Sertraline Hcl    REACTION: headache   Zoloft [sertraline]    Headache   Oxycodone    Rash with percocet.  Has  Tolerated hydrocodone in the past.          Medication List     TAKE these medications    ALPRAZolam 1 MG tablet Commonly known as: XANAX Take 0.5 mg by mouth 3 (three) times daily as needed for sleep. For anxiety   aspirin EC 81 MG tablet Take 1 tablet (81 mg total) by mouth daily. Swallow whole. Start taking on: May 04, 2023 What changed:  when to take this additional instructions Another medication with the same name was removed. Continue taking this medication, and follow the directions you see here.   Brilinta 90 MG Tabs tablet Generic drug: ticagrelor Take 1 tablet (90 mg total) by mouth 2 (two) times daily.   carvedilol 3.125 MG tablet Commonly known as: COREG Take 1 tablet (3.125 mg total) by mouth 2 (two) times daily with a meal.   Entresto 24-26 MG Generic drug: sacubitril-valsartan Take 1 tablet by mouth 2 (two) times daily.   Jardiance 10 MG Tabs tablet Generic drug: empagliflozin Take 1 tablet (10 mg total) by mouth daily.   loperamide 2 MG tablet Commonly known as: IMODIUM A-D Take 2 mg by mouth 4 (four) times daily as needed for diarrhea or loose stools.   nitroGLYCERIN 0.4 MG SL tablet Commonly known as: NITROSTAT Place 1 tablet (0.4 mg total) under the tongue every 5 (five) minutes x 3 doses as needed for chest pain.   rosuvastatin 40 MG tablet Commonly known as: CRESTOR Take 1 tablet (40 mg total) by mouth daily. Start taking on: May 04, 2023 What changed:  medication strength how much to take           Outstanding Labs/Studies   Repeat BMET at follow up and will need FLP and ALT done at 6 weeks.   Duration of Discharge Encounter   Greater than 30 minutes including physician time.  Signed, Abagail Kitchens, PA-C 05/03/2023, 12:49 PM

## 2023-05-04 LAB — LIPOPROTEIN A (LPA): Lipoprotein (a): 197.8 nmol/L — ABNORMAL HIGH (ref <75.0)

## 2023-05-10 ENCOUNTER — Ambulatory Visit: Payer: Medicare Other | Attending: Medical | Admitting: Medical

## 2023-05-10 ENCOUNTER — Encounter: Payer: Self-pay | Admitting: Medical

## 2023-05-10 ENCOUNTER — Telehealth (HOSPITAL_COMMUNITY): Payer: Self-pay

## 2023-05-10 VITALS — BP 92/68 | HR 92 | Ht 73.0 in | Wt 220.4 lb

## 2023-05-10 DIAGNOSIS — Z79899 Other long term (current) drug therapy: Secondary | ICD-10-CM

## 2023-05-10 DIAGNOSIS — I214 Non-ST elevation (NSTEMI) myocardial infarction: Secondary | ICD-10-CM

## 2023-05-10 DIAGNOSIS — I10 Essential (primary) hypertension: Secondary | ICD-10-CM

## 2023-05-10 DIAGNOSIS — I255 Ischemic cardiomyopathy: Secondary | ICD-10-CM

## 2023-05-10 DIAGNOSIS — E782 Mixed hyperlipidemia: Secondary | ICD-10-CM

## 2023-05-10 DIAGNOSIS — I5022 Chronic systolic (congestive) heart failure: Secondary | ICD-10-CM

## 2023-05-10 DIAGNOSIS — I2510 Atherosclerotic heart disease of native coronary artery without angina pectoris: Secondary | ICD-10-CM

## 2023-05-10 DIAGNOSIS — N179 Acute kidney failure, unspecified: Secondary | ICD-10-CM

## 2023-05-10 MED ORDER — LOSARTAN POTASSIUM 25 MG PO TABS: 12.5000 mg | ORAL_TABLET | Freq: Every day | ORAL | 3 refills | Status: DC

## 2023-05-10 NOTE — Progress Notes (Signed)
Cardiology Office Note:    Date:  05/10/2023   ID:  Connor Hubert., DOB [birth year], MRN [id number]  PCP:  [facility]  [facility] Cardiologist:  Yvonne Kendall, MD  [facility] Electrophysiologist:  None   Referring MD: [facility]*   Chief Complaint: Hospital follow-up  History of Present Illness:    Connor Genrich. is a 67 y.o. male with a hx of HTN, HLD, prediabetic, anxiety, chronic pain, vasovagal syncope, CAD s/p DES LAD, HFrEF, ICM, who presents for hospital follow-up.   Patient was recently admitted 05/07/2021 with non-STEMI.  Troponins peaked at 4000.  Cardiac cath showed CTO of the LAD with subsequent DES to proximal/mid LAD. He had residual disease 20 to 30% ostial LAD and 30% mid left circumflex stenosis. He was started on DAPT with aspirin and ticagrelor for at least 12 months. Echo showed newly reduced pump function of 30% with normal RV function, hypokinesis of the mid to distal, inferior, inferoseptal, and apical walls. Patient had multiple episodes of relatively slow VT post revascularization.  Patient was started on Coreg, O'Brien, Jardiance.  Today, the patient reports he is overall doing well. He denies chest pain, SOB, lower leg edema, orthopnea, pnd, palpitations. Cath site, right radial, looks stable. He has been walking daily. BP is low today. They have financial concerns for medications and need the patient assistance program papers for a couple of them. 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 BP is low, but he denies significant symptoms.   Past Medical History:  Diagnosis Date   Acute appendicitis 10/05/2013   Lap Appy 10/05/13    Anxiety    Chronic pain    Degenerative joint disease    Depression    GERD (gastroesophageal reflux disease)    Hyperlipidemia    Hypertension    Pneumonia     Past Surgical History:  Procedure Laterality Date   APPENDECTOMY     CORONARY STENT  INTERVENTION N/A 05/01/2023   Procedure: CORONARY STENT INTERVENTION;  Surgeon: Yvonne Kendall, MD;  Location: [facility];  Service: Cardiovascular;  Laterality: N/A;   LAPAROSCOPIC APPENDECTOMY N/A 10/05/2013   Procedure: APPENDECTOMY LAPAROSCOPIC;  Surgeon: Valarie Merino, MD;  Location: [facility] OR[facility];  Service: General;  Laterality: N/A;   LEFT HEART CATH AND CORONARY ANGIOGRAPHY N/A 05/01/2023   Procedure: LEFT HEART CATH AND CORONARY ANGIOGRAPHY;  Surgeon: Yvonne Kendall, MD;  Location: [facility] INVASIVE CV [facility];  Service: Cardiovascular;  Laterality: N/A;    Current Medications: Current Meds  Medication Sig   ALPRAZolam (XANAX) 1 MG tablet Take 0.5 mg by mouth 3 (three) times daily as needed for sleep. For anxiety   aspirin EC 81 MG tablet Take 1 tablet (81 mg total) by mouth daily. Swallow whole.   carvedilol (COREG) 3.125 MG tablet Take 1 tablet (3.125 mg total) by mouth 2 (two) times daily with a meal.   empagliflozin (JARDIANCE) 10 MG TABS tablet Take 1 tablet (10 mg total) by mouth daily.   loperamide (IMODIUM A-D) 2 MG tablet Take 2 mg by mouth 4 (four) times daily as needed for diarrhea or loose stools.   losartan (COZAAR) 25 MG tablet Take 0.5 tablets (12.5 mg total) by mouth daily.   nitroGLYCERIN (NITROSTAT) 0.4 MG SL tablet Place 1 tablet (0.4 mg total) under the tongue every 5 (five) minutes x 3 doses as needed for chest pain.   rosuvastatin (CRESTOR) 40  MG tablet Take 1 tablet (40 mg total) by mouth daily.   ticagrelor (BRILINTA) 90 MG TABS tablet Take 1 tablet (90 mg total) by mouth 2 (two) times daily.   [DISCONTINUED] sacubitril-valsartan (ENTRESTO) 24-26 MG Take 1 tablet by mouth 2 (two) times daily.     Allergies:   Penicillins, Penicillins, Sertraline hcl, Zoloft [sertraline], and Oxycodone   Social History   Socioeconomic History   Marital status: Married    Spouse name: Not on file   Number of children: Not on file   Years of education: Not on file   Highest  education level: Not on file  Occupational History   Not on file  Tobacco Use   Smoking status: Former    Current packs/day: 0.00    Types: Cigarettes    Quit date: 10/12/2012    Years since quitting: 10.5   Smokeless tobacco: Never  Vaping Use   Vaping status: Never Used  Substance and Sexual Activity   Alcohol use: Not Currently    Comment: Nothing since 2014   Drug use: Yes    Types: Marijuana   Sexual activity: Not on file  Other Topics Concern   Not on file  Social History Narrative   ** Merged History Encounter **       Social Determinants of Health   Financial Resource Strain: Not on file  Food Insecurity: No Food Insecurity (05/02/2023)   Hunger Vital Sign    Worried About Running Out of Food in the Last Year: Never true    Ran Out of Food in the Last Year: Never true  Transportation Needs: No Transportation Needs (05/02/2023)   PRAPARE - Administrator, Civil Service (Medical): No    Lack of Transportation (Non-Medical): No  Physical Activity: Not on file  Stress: Not on file  Social Connections: Not on file     Family History: The patient's family history includes CAD in his father; Colon cancer in his maternal grandmother; Diverticulitis in his father; Heart disease (age of onset: 85) in his father; Stroke in his mother. There is no history of Colon polyps.  ROS:   Please see the history of present illness.     All other systems reviewed and are negative.  EKGs/Labs/Other Studies Reviewed:    The following studies were reviewed today:  Echo 04/2023 1. Extremely poor acoustic windows limit study, even with Definity use.  There is akinesis of the mid/distal inferior, inferoseptal and apical  walls. Distal anterior wall is hypokinetic. Left ventricular ejection  fraction, by estimation, is 30%. The left   ventricle has severely decreased function. There is mild left ventricular  hypertrophy.   2. Right ventricular systolic function is normal.  The right ventricular  size is normal.   3. Trivial mitral valve regurgitation.   4. The aortic valve is normal in structure. Aortic valve regurgitation is  not visualized.   5. The inferior vena cava is normal in size with greater than 50%  respiratory variability, suggesting right atrial pressure of 3 mmHg.    LHC 04/2023 Conclusions: Severe single-vessel coronary artery with thrombotic occlusion of proximal/mid LAD.  There are also 20-30% ostial LAD and 30% mid LCx stenoses.  No significant disease observed in nondominant RCA. Severely reduced left ventricular systolic function (LVEF 35-35% with mid/apical anterior and apical inferior akinesis). Mildly elevated left ventricular filling pressure (LVEDP 24 mmHg). Successful PCI to proximal/mid LAD using Synergy XD 3.0 x 28 mm drug-eluting stent with 0% residual  stenosis and TIMI-2 flow in the distal LAD (suspect sluggish distal flow is due to microvascular dysfunction).   Recommendations: Admit to 2-Heart ICU. Continue cangrelor infusion for 2 hours. Dual antiplatelet therapy with aspirin and ticagrelor for at least 12 months. Gentle diuresis. Initiate goal-directed medical therapy for acute HFrEF due to ischemic cardiomyopathy tomorrow as heart rate, blood pressure, and renal function allow. Follow-up echocardiogram. Aggressive secondary prevention of coronary artery disease.   Yvonne Kendall, MD [facility]     Recommendations  Antiplatelet/Anticoag Recommend uninterrupted dual antiplatelet therapy with Aspirin 81mg  daily and Ticagrelor 90mg  twice daily for a minimum of 12 months (ACS-Class I recommendation).  Discharge Date In the absence of any other complications or medical issues, we expect the patient to be ready for discharge from an interventional cardiology perspective on 05/03/2023.     Coronary Diagrams  Diagnostic Dominance: Left  Intervention      EKG:  EKG is ordered today.  The ekg ordered today  demonstrates NSR, 96bpm, IVCD/RBBB, nonspecific T wave changes  Recent Labs: 05/02/2023: ALT 115; Hemoglobin 13.9; Platelets 201 05/03/2023: BUN 17; Creatinine, Ser 1.30; Magnesium 2.0; Potassium 4.2; Sodium 135  Recent Lipid Panel    Component Value Date/Time   CHOL 143 05/02/2023 0257   TRIG 247 (H) 05/02/2023 0257   HDL 48 05/02/2023 0257   CHOLHDL 3.0 05/02/2023 0257   VLDL 49 (H) 05/02/2023 0257   LDLCALC 46 05/02/2023 0257   LDLDIRECT 149.4 06/26/2010 0000    Physical Exam:    VS:  BP 92/68   Pulse 92   Ht 6\' 1"  (1.854 m)   Wt 220 lb 6.4 oz (100 kg)   SpO2 98%   BMI 29.08 kg/m     Wt Readings from Last 3 Encounters:  05/10/23 220 lb 6.4 oz (100 kg)  05/03/23 225 lb 1.6 oz (102.1 kg)  12/19/19 200 lb (90.7 kg)     GEN:  Well nourished, well developed in no acute distress HEENT: Normal NECK: No JVD; No carotid bruits LYMPHATICS: No lymphadenopathy CARDIAC: RRR, no murmurs, rubs, gallops RESPIRATORY:  Clear to auscultation without rales, wheezing or rhonchi  ABDOMEN: Soft, non-tender, non-distended MUSCULOSKELETAL:  No edema; No deformity  SKIN: Warm and dry NEUROLOGIC:  Alert and oriented x 3 PSYCHIATRIC:  Normal affect   ASSESSMENT:    1. NSTEMI (non-ST elevated myocardial infarction) (HCC)   2. Coronary artery disease involving native coronary artery of native heart without angina pectoris   3. Medication management   4. Chronic systolic heart failure (HCC)   5. Ischemic cardiomyopathy   6. Essential hypertension   7. Hyperlipidemia, mixed   8. AKI (acute kidney injury) (HCC)    PLAN:    In order of problems listed above:  NSTEMI CAD s/p DES p-mLAD Recent hospitalization for non-STEMI.  Cardiac cath showed CTO of the LAD with subsequent DES to the proximal/mid LAD.  Cath showed residual nonobstructive disease.  He was started on DAPT with aspirin and Brilinta for at least 12 months.  We will start patient assistance for Brilinta. I will refer the  patient to cardiac rehab.  Continue aspirin, Brilinta, statin, Coreg.  HFrEF ICM Echo during recent hospitalization showed LVEF of 30% with normal RV function, hypokinesis of the mid/distal, inferior, inferoseptal and apical walls.  Patient was started on Coreg, Lewis, and Jardiance.  Blood pressure is low at 92/68.  Patient denies significant symptoms.  I will stop Entresto and start Losartan 12.5mg  daily.  Patient appears euvolemic  on exam.  We will start the patient assistance program for Jardiance. Continue GDMT at follow-up.  Plan to recheck echo in 4 to 6 weeks.  HTN Blood pressure is hypotensive as above. Stop Entresto and start losartan 12.5 mg daily. Continue Coreg and Jardiance.  HLD LDL 46, TG 247. He was changed to Crestor. Repeat lipid panel and LFTs in 6-8 weeks.  AKI Re-check BMET today.  Prediabetes A1c 6.2 in the hospital.  I recommended he follow-up with PCP.  Disposition: Follow up in 1 month(s) with MD/APP   Signed, Ardene Remley David Stall, PA-C  05/10/2023 10:10 AM    [facility]

## 2023-05-10 NOTE — Patient Instructions (Signed)
Medication Instructions:  Your physician recommends the following medication changes.  STOP TAKING: Entresto  START TAKING: Losartan 12.5 mg by mouth daily  *If you need a refill on your cardiac medications before your next appointment, please call your pharmacy*   Lab Work: Your provider would like for you to have following labs drawn today (CBC, BMP).     Testing/Procedures: None ordered today   Follow-Up: At [facility], you and your health needs are our priority.  As part of our continuing mission to provide you with exceptional heart care, we have created designated Provider Care Teams.  These Care Teams include your primary Cardiologist (physician) and Advanced Practice Providers (APPs -  Physician Assistants and Nurse Practitioners) who all work together to provide you with the care you need, when you need it.  We recommend signing up for the patient portal called "MyChart".  Sign up information is provided on this After Visit Summary.  MyChart is used to connect with patients for Virtual Visits (Telemedicine).  Patients are able to view lab/test results, encounter notes, upcoming appointments, etc.  Non-urgent messages can be sent to your provider as well.   To learn more about what you can do with MyChart, go to [email or web address].    Your next appointment:   1 month(s)  Provider:   You may see Yvonne Kendall, MD or one of the following Advanced Practice Providers on your designated Care Team:   Nicolasa Ducking, NP Eula Listen, PA-C Cadence Fransico Michael, PA-C Charlsie Quest, NP

## 2023-05-10 NOTE — Telephone Encounter (Signed)
Per other referral that's in pt's chart. Pt prefer to do his cardiac rehab at [facility]. Unable to reach pt. Couldn't leave a VM, VM box was full. Closed referral.

## 2023-05-13 ENCOUNTER — Other Ambulatory Visit: Payer: Self-pay | Admitting: *Deleted

## 2023-05-13 DIAGNOSIS — Z955 Presence of coronary angioplasty implant and graft: Secondary | ICD-10-CM

## 2023-05-13 DIAGNOSIS — I214 Non-ST elevation (NSTEMI) myocardial infarction: Secondary | ICD-10-CM

## 2023-05-17 ENCOUNTER — Telehealth: Payer: Self-pay | Admitting: Internal Medicine

## 2023-05-17 NOTE — Telephone Encounter (Signed)
Spoke to patient's wife and explained the following:  * Everybody experiences palpitations at some point in their lives. In many circumstances it is simply a heightened awareness of the heart's normal beating. * People with anxiety or stress may describe a 'rapid heartbeat' or 'pounding' in their chest from their heart beating. * Occasional extra heartbeats are experienced by most everyone. Lack of sleep, stress, and caffeinated beverages can aggravate this condition. * Here is some care advice that should help.  HEALTH BASICS: * Sleep: Try to get a sufficient amount of sleep. Lack of sleep can aggravate palpitations. Most people need 7 to 8 hours of sleep each night. * Exercise: Regular exercise will improve your overall health, improve your mood, and is a simple method to reduce stress. * Diet: Eat a balanced healthy diet. * Liquid Intake: Drink adequate liquids, 6 to 8 glasses of water daily. AVOID CAFFEINE: * Avoid caffeine-containing beverages. Reason: Caffeine is a stimulant and can aggravate palpitations. * Examples include coffee, tea, colas, [address], [employer], and some 'energy drinks'. LIMIT ALCOHOL: * Limit your alcohol consumption to no more than 2 drinks a day. * Ideally, eliminate alcohol entirely for the next two weeks. OTHER: * For smokers - Stop or reduce your smoking. * Avoid diet pills. Reason: They act as stimulants. EXPECTED COURSE: * If your symptoms do not improve over the next couple days, then you should make an appointment to see your doctor. CALL BACK IF: * Chest pain, lightheadedness or difficulty breathing occurs * Heart beating over 140 beats / minute * More than 3 extra or skipped beats / minute * You become worse  CARE ADVICE given per Heart Rate and Heartbeat Questions (Adult) guideline.  Patient's wife understood with read back

## 2023-05-17 NOTE — Telephone Encounter (Signed)
Patient c/o Palpitations:  High priority if patient c/o lightheadedness, shortness of breath, or chest pain  How long have you had palpitations/irregular HR/ Afib? Are you having the symptoms now? Off and on since yesterday  Are you currently experiencing lightheadedness, SOB or CP? No  Do you have a history of afib (atrial fibrillation) or irregular heart rhythm? No  Have you checked your BP or HR? (document readings if available): No  Are you experiencing any other symptoms?   Patient's wife stated that the patient's heart started to flutter yesterday. Patient's wife stated this is the first time it has happened for the patient. Patient's wife stated that the flutter has been off and on. Please advise.

## 2023-05-19 ENCOUNTER — Telehealth: Payer: Self-pay | Admitting: Internal Medicine

## 2023-05-19 NOTE — Telephone Encounter (Signed)
Patient is returning call regarding lab results. He states he didn't speak with anyone to discuss his results and he would like a call back.

## 2023-05-19 NOTE — Telephone Encounter (Signed)
Spoke to patient and informed him of the provider's results/recommendations from recent lab draw as follows:  "Labs overall look good"  Patient understood with read back

## 2023-05-20 ENCOUNTER — Encounter: Payer: Medicare Other | Attending: Internal Medicine

## 2023-05-20 DIAGNOSIS — Z955 Presence of coronary angioplasty implant and graft: Secondary | ICD-10-CM | POA: Insufficient documentation

## 2023-05-20 DIAGNOSIS — I214 Non-ST elevation (NSTEMI) myocardial infarction: Secondary | ICD-10-CM | POA: Insufficient documentation

## 2023-05-20 NOTE — Progress Notes (Signed)
Virtual Visit completed. Patient informed on EP and RD appointment and 6 Minute walk test. Patient also informed of patient health questionnaires on My Chart. Patient Verbalizes understanding. Visit diagnosis can be found in [place] 05/08/2023.

## 2023-05-26 ENCOUNTER — Ambulatory Visit: Payer: Medicare Other

## 2023-05-26 VITALS — Ht 72.5 in | Wt 217.9 lb

## 2023-05-26 DIAGNOSIS — I214 Non-ST elevation (NSTEMI) myocardial infarction: Secondary | ICD-10-CM | POA: Diagnosis present

## 2023-05-26 DIAGNOSIS — Z955 Presence of coronary angioplasty implant and graft: Secondary | ICD-10-CM

## 2023-05-26 NOTE — Progress Notes (Signed)
Cardiac Individual Treatment Plan  Patient Details  Name: Connor Haas. MRN: [id number] Date of Birth: Mar 29, 1956 Referring Provider:   Flowsheet Row Cardiac Rehab from 05/26/2023 in [facility] Cardiac and Pulmonary Rehab  Referring Provider End       Initial Encounter Date:  Flowsheet Row Cardiac Rehab from 05/26/2023 in [facility] Cardiac and Pulmonary Rehab  Date 05/26/23       Visit Diagnosis: No diagnosis found.  Patient's Home Medications on Admission:  Current Outpatient Medications:    ALPRAZolam (XANAX) 1 MG tablet, Take 0.5 mg by mouth 3 (three) times daily as needed for sleep. For anxiety, Disp: , Rfl:    aspirin EC 81 MG tablet, Take 1 tablet (81 mg total) by mouth daily. Swallow whole., Disp: 120 tablet, Rfl: 3   carvedilol (COREG) 3.125 MG tablet, Take 1 tablet (3.125 mg total) by mouth 2 (two) times daily with a meal., Disp: 60 tablet, Rfl: 11   empagliflozin (JARDIANCE) 10 MG TABS tablet, Take 1 tablet (10 mg total) by mouth daily., Disp: 30 tablet, Rfl: 11   loperamide (IMODIUM A-D) 2 MG tablet, Take 2 mg by mouth 4 (four) times daily as needed for diarrhea or loose stools., Disp: , Rfl:    losartan (COZAAR) 25 MG tablet, Take 0.5 tablets (12.5 mg total) by mouth daily., Disp: 45 tablet, Rfl: 3   nitroGLYCERIN (NITROSTAT) 0.4 MG SL tablet, Place 1 tablet (0.4 mg total) under the tongue every 5 (five) minutes x 3 doses as needed for chest pain., Disp: 25 tablet, Rfl: 1   rosuvastatin (CRESTOR) 40 MG tablet, Take 1 tablet (40 mg total) by mouth daily., Disp: 90 tablet, Rfl: 3   ticagrelor (BRILINTA) 90 MG TABS tablet, Take 1 tablet (90 mg total) by mouth 2 (two) times daily., Disp: 60 tablet, Rfl: 11  Past Medical History: Past Medical History:  Diagnosis Date   Acute appendicitis 10/05/2013   Lap Appy 10/05/13    Anxiety    Chronic pain    Degenerative joint disease    Depression    GERD (gastroesophageal reflux disease)    Hyperlipidemia    Hypertension     Pneumonia     Tobacco Use: Social History   Tobacco Use  Smoking Status Former   Current packs/day: 0.00   Types: Cigarettes   Quit date: 10/12/2012   Years since quitting: 10.6  Smokeless Tobacco Never    Labs: Review Flowsheet       Latest Ref Rng & Units 07/14/2007 06/26/2010 12/28/2012 05/02/2023  Labs for ITP Cardiac and Pulmonary Rehab  Cholestrol 0 - 200 mg/dL 329  518  - 841   LDL (calc) 0 - 99 mg/dL - - - 46   Direct LDL mg/dL 660.6  301.6  - -  HDL-C >40 mg/dL 01.0  93.23  - 48   Trlycerides <150 mg/dL 557  322.0  - 254   Hemoglobin A1c 4.8 - 5.6 % - - - 6.2   TCO2 0 - 100 mmol/L - - 26  -    Details             Exercise Target Goals: Exercise Program Goal: Individual exercise prescription set using results from initial 6 min walk test and THRR while considering  patient's activity barriers and safety.   Exercise Prescription Goal: Initial exercise prescription builds to 30-45 minutes a day of aerobic activity, 2-3 days per week.  Home exercise guidelines will be given to patient during program as part of  exercise prescription that the participant will acknowledge.   Education: Aerobic Exercise: - Group verbal and visual presentation on the components of exercise prescription. Introduces F.I.T.T principle from ACSM for exercise prescriptions.  Reviews F.I.T.T. principles of aerobic exercise including progression. Written material given at graduation. Flowsheet Row Cardiac Rehab from 05/26/2023 in [facility] Cardiac and Pulmonary Rehab  Education need identified 05/26/23       Education: Resistance Exercise: - Group verbal and visual presentation on the components of exercise prescription. Introduces F.I.T.T principle from ACSM for exercise prescriptions  Reviews F.I.T.T. principles of resistance exercise including progression. Written material given at graduation.    Education: Exercise & Equipment Safety: - Individual verbal instruction and demonstration of  equipment use and safety with use of the equipment. Flowsheet Row Cardiac Rehab from 05/26/2023 in [facility] Cardiac and Pulmonary Rehab  Date 05/26/23  Educator [facility]  Instruction Review Code 1- Verbalizes Understanding       Education: Exercise Physiology & General Exercise Guidelines: - Group verbal and written instruction with models to review the exercise physiology of the cardiovascular system and associated critical values. Provides general exercise guidelines with specific guidelines to those with heart or lung disease.  Flowsheet Row Cardiac Rehab from 05/26/2023 in [facility] Cardiac and Pulmonary Rehab  Education need identified 05/26/23       Education: Flexibility, Balance, Mind/Body Relaxation: - Group verbal and visual presentation with interactive activity on the components of exercise prescription. Introduces F.I.T.T principle from ACSM for exercise prescriptions. Reviews F.I.T.T. principles of flexibility and balance exercise training including progression. Also discusses the mind body connection.  Reviews various relaxation techniques to help reduce and manage stress (i.e. Deep breathing, progressive muscle relaxation, and visualization). Balance handout provided to take home. Written material given at graduation.   Activity Barriers & Risk Stratification:  Activity Barriers & Cardiac Risk Stratification - 05/26/23 1537       Activity Barriers & Cardiac Risk Stratification   Activity Barriers None    Cardiac Risk Stratification High             6 Minute Walk:  6 Minute Walk     Row Name 05/26/23 1535         6 Minute Walk   Phase Initial     Distance 1340 feet     Walk Time 6 minutes     # of Rest Breaks 0     MPH 2.54     METS 2.97     RPE 11     Perceived Dyspnea  0     VO2 Peak 10.4     Symptoms No     Resting HR 59 bpm     Resting BP 102/58     Resting Oxygen Saturation  99 %     Exercise Oxygen Saturation  during 6 min walk 100 %     Max Ex. HR 92 bpm      Max Ex. BP 122/54     2 Minute Post BP 102/58              Oxygen Initial Assessment:   Oxygen Re-Evaluation:   Oxygen Discharge (Final Oxygen Re-Evaluation):   Initial Exercise Prescription:  Initial Exercise Prescription - 05/26/23 1500       Date of Initial Exercise RX and Referring Provider   Date 05/26/23    Referring Provider End      Oxygen   Maintain Oxygen Saturation 88% or higher      Treadmill  MPH 2.5    Grade 0.5    Minutes 15    METs 3.09      NuStep   Level 3    SPM 80    Minutes 15    METs 2.97      Biostep-RELP   Level 3    SPM 50    Minutes 15    METs 2.97      Prescription Details   Frequency (times per week) 3    Duration Progress to 30 minutes of continuous aerobic without signs/symptoms of physical distress      Intensity   THRR 40-80% of Max Heartrate 96-134    Ratings of Perceived Exertion 11-15    Perceived Dyspnea 0-4      Progression   Progression Continue to progress workloads to maintain intensity without signs/symptoms of physical distress.      Resistance Training   Training Prescription Yes    Weight 4lb    Reps 10-15             Perform Capillary Blood Glucose checks as needed.  Exercise Prescription Changes:   Exercise Prescription Changes     Row Name 05/26/23 1500             Response to Exercise   Blood Pressure (Admit) 102/58       Blood Pressure (Exercise) 122/54       Blood Pressure (Exit) 102/58       Heart Rate (Admit) 59 bpm       Heart Rate (Exercise) 92 bpm       Heart Rate (Exit) 74 bpm       Oxygen Saturation (Admit) 99 %       Oxygen Saturation (Exercise) 100 %       Oxygen Saturation (Exit) 100 %       Rating of Perceived Exertion (Exercise) 11       Perceived Dyspnea (Exercise) 0       Symptoms none       Comments results         Progression   Progression Continue to progress workloads to maintain intensity without signs/symptoms of physical distress.                 Exercise Comments:   Exercise Goals and Review:   Exercise Goals     Row Name 05/26/23 1601             Exercise Goals   Increase Physical Activity Yes       Intervention Develop an individualized exercise prescription for aerobic and resistive training based on initial evaluation findings, risk stratification, comorbidities and participant's personal goals.;Provide advice, education, support and counseling about physical activity/exercise needs.       Expected Outcomes Short Term: Attend rehab on a regular basis to increase amount of physical activity.;Long Term: Add in home exercise to make exercise part of routine and to increase amount of physical activity.;Long Term: Exercising regularly at least 3-5 days a week.       Increase Strength and Stamina Yes       Expected Outcomes Short Term: Increase workloads from initial exercise prescription for resistance, speed, and METs.;Short Term: Perform resistance training exercises routinely during rehab and add in resistance training at home;Long Term: Improve cardiorespiratory fitness, muscular endurance and strength as measured by increased METs and functional capacity ( )       Able to understand and use rate of perceived exertion (RPE) scale Yes  Intervention Provide education and explanation on how to use RPE scale       Expected Outcomes Short Term: Able to use RPE daily in rehab to express subjective intensity level;Long Term:  Able to use RPE to guide intensity level when exercising independently       Able to understand and use Dyspnea scale Yes       Intervention Provide education and explanation on how to use Dyspnea scale       Expected Outcomes Short Term: Able to use Dyspnea scale daily in rehab to express subjective sense of shortness of breath during exertion;Long Term: Able to use Dyspnea scale to guide intensity level when exercising independently       Knowledge and understanding of Target Heart Rate  Range (THRR) Yes       Intervention Provide education and explanation of THRR including how the numbers were predicted and where they are located for reference       Expected Outcomes Short Term: Able to state/look up THRR;Short Term: Able to use daily as guideline for intensity in rehab;Long Term: Able to use THRR to govern intensity when exercising independently       Able to check pulse independently Yes       Intervention Provide education and demonstration on how to check pulse in carotid and radial arteries.;Review the importance of being able to check your own pulse for safety during independent exercise       Expected Outcomes Short Term: Able to explain why pulse checking is important during independent exercise;Long Term: Able to check pulse independently and accurately       Understanding of Exercise Prescription Yes       Intervention Provide education, explanation, and written materials on patient's individual exercise prescription       Expected Outcomes Short Term: Able to explain program exercise prescription;Long Term: Able to explain home exercise prescription to exercise independently                Exercise Goals Re-Evaluation :   Discharge Exercise Prescription (Final Exercise Prescription Changes):  Exercise Prescription Changes - 05/26/23 1500       Response to Exercise   Blood Pressure (Admit) 102/58    Blood Pressure (Exercise) 122/54    Blood Pressure (Exit) 102/58    Heart Rate (Admit) 59 bpm    Heart Rate (Exercise) 92 bpm    Heart Rate (Exit) 74 bpm    Oxygen Saturation (Admit) 99 %    Oxygen Saturation (Exercise) 100 %    Oxygen Saturation (Exit) 100 %    Rating of Perceived Exertion (Exercise) 11    Perceived Dyspnea (Exercise) 0    Symptoms none    Comments results      Progression   Progression Continue to progress workloads to maintain intensity without signs/symptoms of physical distress.             Nutrition:  Target Goals:  Understanding of nutrition guidelines, daily intake of sodium 1500mg , cholesterol 200mg , calories 30% from fat and 7% or less from saturated fats, daily to have 5 or more servings of fruits and vegetables.  Education: All About Nutrition: -Group instruction provided by verbal, written material, interactive activities, discussions, models, and posters to present general guidelines for heart healthy nutrition including fat, fiber, MyPlate, the role of sodium in heart healthy nutrition, utilization of the nutrition label, and utilization of this knowledge for meal planning. Follow up email sent as well. Written material given  at graduation.   Biometrics:  Pre Biometrics - 05/26/23 1549       Pre Biometrics   Height 6' 0.5" (1.842 m)    Weight 217 lb 14.4 oz (98.8 kg)    Waist Circumference 43.4 inches    Hip Circumference 44.5 inches    Waist to Hip Ratio 0.98 %    BMI (Calculated) 29.13    Single Leg Stand 3.59 seconds              Nutrition Therapy Plan and Nutrition Goals:   Nutrition Assessments:  MEDIFICTS Score Key: ?70 Need to make dietary changes  40-70 Heart Healthy Diet ? 40 Therapeutic Level Cholesterol Diet  Flowsheet Row Cardiac Rehab from 05/26/2023 in [facility] Cardiac and Pulmonary Rehab  Picture Your Plate Total Score on Admission 69      Picture Your Plate Scores: <09 Unhealthy dietary pattern with much room for improvement. 41-50 Dietary pattern unlikely to meet recommendations for good health and room for improvement. 51-60 More healthful dietary pattern, with some room for improvement.  >60 Healthy dietary pattern, although there may be some specific behaviors that could be improved.    Nutrition Goals Re-Evaluation:   Nutrition Goals Discharge (Final Nutrition Goals Re-Evaluation):   Psychosocial: Target Goals: Acknowledge presence or absence of significant depression and/or stress, maximize coping skills, provide positive support system.  Participant is able to verbalize types and ability to use techniques and skills needed for reducing stress and depression.   Education: Stress, Anxiety, and Depression - Group verbal and visual presentation to define topics covered.  Reviews how body is impacted by stress, anxiety, and depression.  Also discusses healthy ways to reduce stress and to treat/manage anxiety and depression.  Written material given at graduation.   Education: Sleep Hygiene -Provides group verbal and written instruction about how sleep can affect your health.  Define sleep hygiene, discuss sleep cycles and impact of sleep habits. Review good sleep hygiene tips.    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 Nothing stems his anxiety and is there. He stresses about alot of little things and life situations. His wife is a good support system for it.      Family Dynamics   Good Support System? Yes      Barriers   Psychosocial barriers to participate in program The patient should benefit from training in stress management and relaxation.;There are no identifiable barriers or psychosocial needs.      Screening Interventions   Interventions Encouraged to exercise;To provide support and resources with identified psychosocial needs;Provide feedback about the scores to participant    Expected Outcomes Short Term goal: Utilizing psychosocial counselor, staff and physician to assist with identification of specific Stressors or current issues interfering with healing process. Setting desired goal for each stressor or current issue identified.;Long Term Goal: Stressors or current issues are controlled or eliminated.;Short Term goal: Identification and review with participant of any Quality of Life or  Depression concerns found by scoring the questionnaire.;Long Term goal: The participant improves quality of Life and PHQ9 Scores as seen by post scores and/or verbalization of changes             Quality of Life Scores:   Quality of Life - 05/26/23 1551  Quality of Life   Select Quality of Life      Quality of Life Scores   Health/Function Pre 25.1 %    Socioeconomic Pre 20.13 %    Psych/Spiritual Pre 22.71 %    Family Pre 25.2 %    GLOBAL Pre 23.5 %            Scores of 19 and below usually indicate a poorer quality of life in these areas.  A difference of  2-3 points is a clinically meaningful difference.  A difference of 2-3 points in the total score of the Quality of Life Index has been associated with significant improvement in overall quality of life, self-image, physical symptoms, and general health in studies assessing change in quality of life.  PHQ-9: Review Flowsheet       05/26/2023  Depression screen PHQ 2/9  Decreased Interest 0  Down, Depressed, Hopeless 1  PHQ - 2 Score 1  Altered sleeping 1  Tired, decreased energy 1  Change in appetite 0  Feeling bad or failure about yourself  0  Trouble concentrating 0  Moving slowly or fidgety/restless 1  Suicidal thoughts 0  PHQ-9 Score 4  Difficult doing work/chores Not difficult at all    Details           Interpretation of Total Score  Total Score Depression Severity:  1-4 = Minimal depression, 5-9 = Mild depression, 10-14 = Moderate depression, 15-19 = Moderately severe depression, 20-27 = Severe depression   Psychosocial Evaluation and Intervention:  Psychosocial Evaluation - 05/20/23 1556       Psychosocial Evaluation & Interventions   Interventions Encouraged to exercise with the program and follow exercise prescription;Relaxation education;Stress management education    Comments 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 Nothing stems his anxiety and is there. He stresses  about alot of little things and life situations. His wife is a good support system for it.    Expected Outcomes Short: Start HeartTrack to help with mood. Long: Maintain a healthy mental state    Continue Psychosocial Services  Follow up required by staff             Psychosocial Re-Evaluation:   Psychosocial Discharge (Final Psychosocial Re-Evaluation):   Vocational Rehabilitation: Provide vocational rehab assistance to qualifying candidates.   Vocational Rehab Evaluation & Intervention:   Education: Education Goals: Education classes will be provided on a variety of topics geared toward better understanding of heart health and risk factor modification. Participant will state understanding/return demonstration of topics presented as noted by education test scores.  Learning Barriers/Preferences:  Learning Barriers/Preferences - 05/20/23 1554       Learning Barriers/Preferences   Learning Barriers None    Learning Preferences None             General Cardiac Education Topics:  AED/CPR: - Group verbal and written instruction with the use of models to demonstrate the basic use of the AED with the basic ABC's of resuscitation.   Anatomy and Cardiac Procedures: - Group verbal and visual presentation and models provide information about basic cardiac anatomy and function. Reviews the testing methods done to diagnose heart disease and the outcomes of the test results. Describes the treatment choices: Medical Management, Angioplasty, or Coronary Bypass Surgery for treating various heart conditions including Myocardial Infarction, Angina, Valve Disease, and Cardiac Arrhythmias.  Written material given at graduation.   Medication Safety: - Group verbal and visual instruction to review commonly prescribed medications for  heart and lung disease. Reviews the medication, class of the drug, and side effects. Includes the steps to properly store meds and maintain the prescription  regimen.  Written material given at graduation.   Intimacy: - Group verbal instruction through game format to discuss how heart and lung disease can affect sexual intimacy. Written material given at graduation..   Know Your Numbers and Heart Failure: - Group verbal and visual instruction to discuss disease risk factors for cardiac and pulmonary disease and treatment options.  Reviews associated critical values for Overweight/Obesity, Hypertension, Cholesterol, and Diabetes.  Discusses basics of heart failure: signs/symptoms and treatments.  Introduces Heart Failure Zone chart for action plan for heart failure.  Written material given at graduation.   Infection Prevention: - Provides verbal and written material to individual with discussion of infection control including proper hand washing and proper equipment cleaning during exercise session. Flowsheet Row Cardiac Rehab from 05/26/2023 in [facility] Cardiac and Pulmonary Rehab  Date 05/26/23  Educator [facility]  Instruction Review Code 1- Verbalizes Understanding       Falls Prevention: - Provides verbal and written material to individual with discussion of falls prevention and safety. Flowsheet Row Cardiac Rehab from 05/26/2023 in [facility] Cardiac and Pulmonary Rehab  Date 05/26/23  Educator [facility]  Instruction Review Code 1- Verbalizes Understanding       Other: -Provides group and verbal instruction on various topics (see comments)   Knowledge Questionnaire Score:  Knowledge Questionnaire Score - 05/26/23 1556       Knowledge Questionnaire Score   Pre Score 25/26             Core Components/Risk Factors/Patient Goals at Admission:  Personal Goals and Risk Factors at Admission - 05/26/23 1559       Core Components/Risk Factors/Patient Goals on Admission    Weight Management Yes;Weight Loss    Intervention Weight Management: Develop a combined nutrition and exercise program designed to reach desired caloric intake, while maintaining  appropriate intake of nutrient and fiber, sodium and fats, and appropriate energy expenditure required for the weight goal.;Weight Management: Provide education and appropriate resources to help participant work on and attain dietary goals.;Weight Management/Obesity: Establish reasonable short term and long term weight goals.;Obesity: Provide education and appropriate resources to help participant work on and attain dietary goals.    Admit Weight 217 lb 14.4 oz (98.8 kg)    Goal Weight: Short Term 210 lb (95.3 kg)    Goal Weight: Long Term 200 lb (90.7 kg)    Expected Outcomes Short Term: Continue to assess and modify interventions until short term weight is achieved;Long Term: Adherence to nutrition and physical activity/exercise program aimed toward attainment of established weight goal;Weight Loss: Understanding of general recommendations for a balanced deficit meal plan, which promotes 1-2 lb weight loss per week and includes a negative energy balance of [phone] kcal/d;Understanding recommendations for meals to include 15-35% energy as protein, 25-35% energy from fat, 35-60% energy from carbohydrates, less than 200mg  of dietary cholesterol, 20-35 gm of total fiber daily;Understanding of distribution of calorie intake throughout the day with the consumption of 4-5 meals/snacks    Heart Failure Yes    Intervention Provide a combined exercise and nutrition program that is supplemented with education, support and counseling about heart failure. Directed toward relieving symptoms such as shortness of breath, decreased exercise tolerance, and extremity edema.    Expected Outcomes Improve functional capacity of life;Short term: Attendance in program 2-3 days a week with increased exercise capacity. Reported lower  sodium intake. Reported increased fruit and vegetable intake. Reports medication compliance.;Short term: Daily weights obtained and reported for increase. Utilizing diuretic protocols set by  physician.;Long term: Adoption of self-care skills and reduction of barriers for early signs and symptoms recognition and intervention leading to self-care maintenance.    Hypertension Yes    Intervention Provide education on lifestyle modifcations including regular physical activity/exercise, weight management, moderate sodium restriction and increased consumption of fresh fruit, vegetables, and low fat dairy, alcohol moderation, and smoking cessation.;Monitor prescription use compliance.    Expected Outcomes Short Term: Continued assessment and intervention until BP is < 140/26mm HG in hypertensive participants. < 130/20mm HG in hypertensive participants with diabetes, heart failure or chronic kidney disease.;Long Term: Maintenance of blood pressure at goal levels.    Lipids Yes    Intervention Provide education and support for participant on nutrition & aerobic/resistive exercise along with prescribed medications to achieve LDL 70mg , HDL >40mg .    Expected Outcomes Short Term: Participant states understanding of desired cholesterol values and is compliant with medications prescribed. Participant is following exercise prescription and nutrition guidelines.;Long Term: Cholesterol controlled with medications as prescribed, with individualized exercise RX and with personalized nutrition plan. Value goals: LDL < 70mg , HDL > 40 mg.             Education:Diabetes - Individual verbal and written instruction to review signs/symptoms of diabetes, desired ranges of glucose level fasting, after meals and with exercise. Acknowledge that pre and post exercise glucose checks will be done for 3 sessions at entry of program.   Core Components/Risk Factors/Patient Goals Review:    Core Components/Risk Factors/Patient Goals at Discharge (Final Review):    ITP Comments:  ITP Comments     Row Name 05/20/23 1553 05/26/23 1555         ITP Comments Virtual Visit completed. Patient informed on EP and RD  appointment and 6 Minute walk test. Patient also informed of patient health questionnaires on My Chart. Patient Verbalizes understanding. Visit diagnosis can be found in [facility] 05/08/2023. Completed and gym orientation. Initial ITP created and sent for review to Dr. Bethann Punches, Medical Director.               Comments: initial ITP

## 2023-05-26 NOTE — Patient Instructions (Signed)
Patient Instructions  Patient Details  Name: Connor Haas. MRN: [id number] Date of Birth: 30-Jul-1956 Referring Provider:  [facility]  Below are your personal goals for exercise, nutrition, and risk factors. Our goal is to help you stay on track towards obtaining and maintaining these goals. We will be discussing your progress on these goals with you throughout the program.  Initial Exercise Prescription:  Initial Exercise Prescription - 05/26/23 1500       Date of Initial Exercise RX and Referring Provider   Date 05/26/23    Referring Provider End      Oxygen   Maintain Oxygen Saturation 88% or higher      Treadmill   MPH 2.5    Grade 0.5    Minutes 15    METs 3.09      NuStep   Level 3    SPM 80    Minutes 15    METs 2.97      Biostep-RELP   Level 3    SPM 50    Minutes 15    METs 2.97      Prescription Details   Frequency (times per week) 3    Duration Progress to 30 minutes of continuous aerobic without signs/symptoms of physical distress      Intensity   THRR 40-80% of Max Heartrate 96-134    Ratings of Perceived Exertion 11-15    Perceived Dyspnea 0-4      Progression   Progression Continue to progress workloads to maintain intensity without signs/symptoms of physical distress.      Resistance Training   Training Prescription Yes    Weight 4lb    Reps 10-15             Exercise Goals: Frequency: Be able to perform aerobic exercise two to three times per week in program working toward 2-5 days per week of home exercise.  Intensity: Work with a perceived exertion of 11 (fairly light) - 15 (hard) while following your exercise prescription.  We will make changes to your prescription with you as you progress through the program.   Duration: Be able to do 30 to 45 minutes of continuous aerobic exercise in addition to a 5 minute warm-up and a 5 minute cool-down routine.   Nutrition Goals: Your personal nutrition goals will be  established when you do your nutrition analysis with the dietician.  The following are general nutrition guidelines to follow: Cholesterol < 200mg /day Sodium < 1500mg /day Fiber: Men over 50 yrs - 30 grams per day  Personal Goals:  Personal Goals and Risk Factors at Admission - 05/26/23 1559       Core Components/Risk Factors/Patient Goals on Admission    Weight Management Yes;Weight Loss    Intervention Weight Management: Develop a combined nutrition and exercise program designed to reach desired caloric intake, while maintaining appropriate intake of nutrient and fiber, sodium and fats, and appropriate energy expenditure required for the weight goal.;Weight Management: Provide education and appropriate resources to help participant work on and attain dietary goals.;Weight Management/Obesity: Establish reasonable short term and long term weight goals.;Obesity: Provide education and appropriate resources to help participant work on and attain dietary goals.    Admit Weight 217 lb 14.4 oz (98.8 kg)    Goal Weight: Short Term 210 lb (95.3 kg)    Goal Weight: Long Term 200 lb (90.7 kg)    Expected Outcomes Short Term: Continue to assess and modify interventions until short term weight is achieved;Long Term:  Adherence to nutrition and physical activity/exercise program aimed toward attainment of established weight goal;Weight Loss: Understanding of general recommendations for a balanced deficit meal plan, which promotes 1-2 lb weight loss per week and includes a negative energy balance of [phone] kcal/d;Understanding recommendations for meals to include 15-35% energy as protein, 25-35% energy from fat, 35-60% energy from carbohydrates, less than 200mg  of dietary cholesterol, 20-35 gm of total fiber daily;Understanding of distribution of calorie intake throughout the day with the consumption of 4-5 meals/snacks    Heart Failure Yes    Intervention Provide a combined exercise and nutrition program that  is supplemented with education, support and counseling about heart failure. Directed toward relieving symptoms such as shortness of breath, decreased exercise tolerance, and extremity edema.    Expected Outcomes Improve functional capacity of life;Short term: Attendance in program 2-3 days a week with increased exercise capacity. Reported lower sodium intake. Reported increased fruit and vegetable intake. Reports medication compliance.;Short term: Daily weights obtained and reported for increase. Utilizing diuretic protocols set by physician.;Long term: Adoption of self-care skills and reduction of barriers for early signs and symptoms recognition and intervention leading to self-care maintenance.    Hypertension Yes    Intervention Provide education on lifestyle modifcations including regular physical activity/exercise, weight management, moderate sodium restriction and increased consumption of fresh fruit, vegetables, and low fat dairy, alcohol moderation, and smoking cessation.;Monitor prescription use compliance.    Expected Outcomes Short Term: Continued assessment and intervention until BP is < 140/17mm HG in hypertensive participants. < 130/57mm HG in hypertensive participants with diabetes, heart failure or chronic kidney disease.;Long Term: Maintenance of blood pressure at goal levels.    Lipids Yes    Intervention Provide education and support for participant on nutrition & aerobic/resistive exercise along with prescribed medications to achieve LDL 70mg , HDL >40mg .    Expected Outcomes Short Term: Participant states understanding of desired cholesterol values and is compliant with medications prescribed. Participant is following exercise prescription and nutrition guidelines.;Long Term: Cholesterol controlled with medications as prescribed, with individualized exercise RX and with personalized nutrition plan. Value goals: LDL < 70mg , HDL > 40 mg.             Tobacco Use Initial  Evaluation: Social History   Tobacco Use  Smoking Status Former   Current packs/day: 0.00   Types: Cigarettes   Quit date: 10/12/2012   Years since quitting: 10.6  Smokeless Tobacco Never    Exercise Goals and Review:  Exercise Goals     Row Name 05/26/23 1601             Exercise Goals   Increase Physical Activity Yes       Intervention Develop an individualized exercise prescription for aerobic and resistive training based on initial evaluation findings, risk stratification, comorbidities and participant's personal goals.;Provide advice, education, support and counseling about physical activity/exercise needs.       Expected Outcomes Short Term: Attend rehab on a regular basis to increase amount of physical activity.;Long Term: Add in home exercise to make exercise part of routine and to increase amount of physical activity.;Long Term: Exercising regularly at least 3-5 days a week.       Increase Strength and Stamina Yes       Expected Outcomes Short Term: Increase workloads from initial exercise prescription for resistance, speed, and METs.;Short Term: Perform resistance training exercises routinely during rehab and add in resistance training at home;Long Term: Improve cardiorespiratory fitness, muscular endurance and strength as measured by  increased METs and functional capacity ( )       Able to understand and use rate of perceived exertion (RPE) scale Yes       Intervention Provide education and explanation on how to use RPE scale       Expected Outcomes Short Term: Able to use RPE daily in rehab to express subjective intensity level;Long Term:  Able to use RPE to guide intensity level when exercising independently       Able to understand and use Dyspnea scale Yes       Intervention Provide education and explanation on how to use Dyspnea scale       Expected Outcomes Short Term: Able to use Dyspnea scale daily in rehab to express subjective sense of shortness of breath during  exertion;Long Term: Able to use Dyspnea scale to guide intensity level when exercising independently       Knowledge and understanding of Target Heart Rate Range (THRR) Yes       Intervention Provide education and explanation of THRR including how the numbers were predicted and where they are located for reference       Expected Outcomes Short Term: Able to state/look up THRR;Short Term: Able to use daily as guideline for intensity in rehab;Long Term: Able to use THRR to govern intensity when exercising independently       Able to check pulse independently Yes       Intervention Provide education and demonstration on how to check pulse in carotid and radial arteries.;Review the importance of being able to check your own pulse for safety during independent exercise       Expected Outcomes Short Term: Able to explain why pulse checking is important during independent exercise;Long Term: Able to check pulse independently and accurately       Understanding of Exercise Prescription Yes       Intervention Provide education, explanation, and written materials on patient's individual exercise prescription       Expected Outcomes Short Term: Able to explain program exercise prescription;Long Term: Able to explain home exercise prescription to exercise independently                Copy of goals given to participant.

## 2023-05-26 NOTE — Progress Notes (Signed)
Assessment start time: 3:05 PM  Comorbidities HLD, HTN, prediabetes (A1C 6.2%, 05/02/23)  Accessibility to food: no concerns Digestive issues/concerns: no known food allergies, lactose intolerant on melted cheese mostly, other dairy is fine.   24-hours Recall: B: peanut butter , honey on wheat bread, banana, 3 pieces of pineapple L: low sodium [place] sandwich on wheat bread, lettuce  Snack: 2 oreos D: steak ~4oz each, veggies in olive oil  Beverages: water (~64oz) and juice, used to drink sweet tea Pt eats away from home 1 times a week.   Education r/t nutrition plan Patient and wife present today, they have begun making changes likes choosing leaner meats, smaller portions, and watching sodium. He is drinking ~64oz of water daily. Some juice, cut out sweet tea. Spoke with them about sugary beverages and to work on reducing and cutting them out. Reviewed his A1C and carb sources, portions, and goal of 30-60g per meal. Went over [employer] handout, types of fats, sources, and how to read labels. Built out meals and snacks with foods he likes focusing on pairing a controlled amount of carbs with healthy protein and including veggies at larger meals.   Goal 1: Eat a protein at ever meal Goal 2: Watch sodium intake, read labels of food ate often  Goal 3: Watch portions and include veggies at larger meals   End time 4:16 PM

## 2023-05-28 DIAGNOSIS — Z0279 Encounter for issue of other medical certificate: Secondary | ICD-10-CM

## 2023-05-31 ENCOUNTER — Other Ambulatory Visit: Payer: Self-pay

## 2023-05-31 ENCOUNTER — Encounter: Payer: Medicare Other | Admitting: *Deleted

## 2023-05-31 ENCOUNTER — Telehealth: Payer: Self-pay | Admitting: Medical

## 2023-05-31 ENCOUNTER — Encounter: Payer: Self-pay | Admitting: Medical

## 2023-05-31 DIAGNOSIS — Z955 Presence of coronary angioplasty implant and graft: Secondary | ICD-10-CM

## 2023-05-31 DIAGNOSIS — I214 Non-ST elevation (NSTEMI) myocardial infarction: Secondary | ICD-10-CM | POA: Diagnosis not present

## 2023-05-31 NOTE — Progress Notes (Signed)
Daily Session Note  Patient Details  Name: Connor Haas. MRN: [id number] Date of Birth: 1956-08-26 Referring Provider:   Flowsheet Row Cardiac Rehab from 05/26/2023 in [facility] Cardiac and Pulmonary Rehab  Referring Provider End       Encounter Date: 05/31/2023  Check In:  Session Check In - 05/31/23 1540       Check-In   Supervising physician immediately available to respond to emergencies See telemetry face sheet for immediately available ER MD    Location [facility]-Cardiac & Pulmonary Rehab    Staff Present Susann Givens, RN BSN;Maxon Conetta BS, , Exercise Physiologist;Laureen Manson Passey, BS, RRT, CPFT    Virtual Visit No    Medication changes reported     No    Fall or balance concerns reported    No    Warm-up and Cool-down Performed on first and last piece of equipment    Resistance Training Performed Yes    VAD Patient? No    PAD/SET Patient? No      Pain Assessment   Currently in Pain? No/denies                Social History   Tobacco Use  Smoking Status Former   Current packs/day: 0.00   Types: Cigarettes   Quit date: 10/12/2012   Years since quitting: 10.6  Smokeless Tobacco Never    Goals Met:  Independence with exercise equipment Exercise tolerated well No report of concerns or symptoms today Strength training completed today  Goals Unmet:  Not Applicable  Comments: First full day of exercise!  Patient was oriented to gym and equipment including functions, settings, policies, and procedures.  Patient's individual exercise prescription and treatment plan were reviewed.  All starting workloads were established based on the results of the 6 minute walk test done at initial orientation visit.  The plan for exercise progression was also introduced and progression will be customized based on patient's performance and goals.'     Dr. Bethann Punches is Medical Director for [facility] Cardiac Rehabilitation.  Dr. Vida Rigger is Medical Director for  [facility] Pulmonary Rehabilitation.

## 2023-05-31 NOTE — Telephone Encounter (Signed)
Patient dropped off disability paperwork to be filled out & paid $29 cash. Gave forms to Bernette Mayers to be filled out

## 2023-06-01 NOTE — Telephone Encounter (Signed)
Forms completed by provider and given to Pilar up front for further processing.

## 2023-06-01 NOTE — Telephone Encounter (Signed)
Forms given to Cadence Furth PA for completion

## 2023-06-01 NOTE — Telephone Encounter (Signed)
Called patient forms are ready for pick up at front desk

## 2023-06-02 ENCOUNTER — Encounter: Payer: Medicare Other | Admitting: *Deleted

## 2023-06-02 DIAGNOSIS — I214 Non-ST elevation (NSTEMI) myocardial infarction: Secondary | ICD-10-CM

## 2023-06-02 DIAGNOSIS — Z955 Presence of coronary angioplasty implant and graft: Secondary | ICD-10-CM

## 2023-06-02 NOTE — Progress Notes (Signed)
Daily Session Note  Patient Details  Name: Connor Haas. MRN: [id number] Date of Birth: 21-Jun-1956 Referring Provider:   Flowsheet Row Cardiac Rehab from 05/26/2023 in [facility] Cardiac and Pulmonary Rehab  Referring Provider End       Encounter Date: 06/02/2023  Check In:  Session Check In - 06/02/23 1611       Check-In   Supervising physician immediately available to respond to emergencies See telemetry face sheet for immediately available ER MD    Location [facility]-Cardiac & Pulmonary Rehab    Staff Present Cora Collum, RN, BSN, CCRP;Anaily Ashbaugh Jewel Baize, RN Atilano Median, RN, ADN    Virtual Visit No    Medication changes reported     No    Fall or balance concerns reported    No    Warm-up and Cool-down Performed on first and last piece of equipment    Resistance Training Performed Yes    VAD Patient? No    PAD/SET Patient? No      Pain Assessment   Currently in Pain? No/denies                Social History   Tobacco Use  Smoking Status Former   Current packs/day: 0.00   Types: Cigarettes   Quit date: 10/12/2012   Years since quitting: 10.6  Smokeless Tobacco Never    Goals Met:  Independence with exercise equipment Exercise tolerated well No report of concerns or symptoms today Strength training completed today  Goals Unmet:  Not Applicable  Comments: Pt able to follow exercise prescription today without complaint.  Will continue to monitor for progression.    Dr. Bethann Punches is Medical Director for [facility] Cardiac Rehabilitation.  Dr. Vida Rigger is Medical Director for [facility] Pulmonary Rehabilitation.

## 2023-06-03 ENCOUNTER — Encounter: Payer: Medicare Other | Admitting: *Deleted

## 2023-06-03 DIAGNOSIS — I214 Non-ST elevation (NSTEMI) myocardial infarction: Secondary | ICD-10-CM | POA: Diagnosis not present

## 2023-06-03 DIAGNOSIS — Z955 Presence of coronary angioplasty implant and graft: Secondary | ICD-10-CM

## 2023-06-03 NOTE — Progress Notes (Signed)
Daily Session Note  Patient Details  Name: Connor Haas. MRN: [id number] Date of Birth: 04/08/1956 Referring Provider:   Flowsheet Row Cardiac Rehab from 05/26/2023 in [facility] Cardiac and Pulmonary Rehab  Referring Provider End       Encounter Date: 06/03/2023  Check In:  Session Check In - 06/03/23 1556       Check-In   Supervising physician immediately available to respond to emergencies See telemetry face sheet for immediately available ER MD    Location [facility]-Cardiac & Pulmonary Rehab    Staff Present Maxon Suzzette Righter, , Exercise Physiologist;Taron Mondor Jewel Baize, RN BSN;Margaret Best, MS, Exercise Physiologist    Virtual Visit No    Medication changes reported     No    Fall or balance concerns reported    No    Warm-up and Cool-down Performed on first and last piece of equipment    Resistance Training Performed Yes    VAD Patient? No    PAD/SET Patient? No      Pain Assessment   Currently in Pain? No/denies                Social History   Tobacco Use  Smoking Status Former   Current packs/day: 0.00   Types: Cigarettes   Quit date: 10/12/2012   Years since quitting: 10.6  Smokeless Tobacco Never    Goals Met:  Independence with exercise equipment Exercise tolerated well No report of concerns or symptoms today Strength training completed today  Goals Unmet:  Not Applicable  Comments: Pt able to follow exercise prescription today without complaint.  Will continue to monitor for progression.    Dr. Bethann Punches is Medical Director for [facility] Cardiac Rehabilitation.  Dr. Vida Rigger is Medical Director for [facility] Pulmonary Rehabilitation.

## 2023-06-07 ENCOUNTER — Encounter: Payer: Medicare Other | Admitting: *Deleted

## 2023-06-07 DIAGNOSIS — I214 Non-ST elevation (NSTEMI) myocardial infarction: Secondary | ICD-10-CM | POA: Diagnosis not present

## 2023-06-07 DIAGNOSIS — Z955 Presence of coronary angioplasty implant and graft: Secondary | ICD-10-CM

## 2023-06-07 NOTE — Progress Notes (Signed)
Daily Session Note  Patient Details  Name: Connor Haas. MRN: [id number] Date of Birth: 04/21/1956 Referring Provider:   Flowsheet Row Cardiac Rehab from 05/26/2023 in [facility] Cardiac and Pulmonary Rehab  Referring Provider End       Encounter Date: 06/07/2023  Check In:  Session Check In - 06/07/23 1705       Check-In   Supervising physician immediately available to respond to emergencies See telemetry face sheet for immediately available ER MD    Location [facility]-Cardiac & Pulmonary Rehab    Staff Present Cyndia Diver, RN, BSN, Erle Crocker, BS, ACSM CEP, Exercise Physiologist;Joseph Cal[place], [place]    Virtual Visit No    Medication changes reported     No    Fall or balance concerns reported    No    Tobacco Cessation No Change    Warm-up and Cool-down Performed on first and last piece of equipment    Resistance Training Performed Yes    VAD Patient? No    PAD/SET Patient? No      Pain Assessment   Currently in Pain? No/denies                Social History   Tobacco Use  Smoking Status Former   Current packs/day: 0.00   Types: Cigarettes   Quit date: 10/12/2012   Years since quitting: 10.6  Smokeless Tobacco Never    Goals Met:  Independence with exercise equipment Exercise tolerated well No report of concerns or symptoms today  Goals Unmet:  Not Applicable  Comments: Pt able to follow exercise prescription today without complaint.  Will continue to monitor for progression.    Dr. Bethann Punches is Medical Director for [facility] Cardiac Rehabilitation.  Dr. Vida Rigger is Medical Director for [facility] Pulmonary Rehabilitation.

## 2023-06-08 ENCOUNTER — Encounter: Payer: Self-pay | Admitting: Medical

## 2023-06-08 ENCOUNTER — Ambulatory Visit: Payer: Medicare Other | Attending: Medical | Admitting: Medical

## 2023-06-08 VITALS — BP 100/60 | HR 68 | Ht 73.0 in | Wt 217.8 lb

## 2023-06-08 DIAGNOSIS — I214 Non-ST elevation (NSTEMI) myocardial infarction: Secondary | ICD-10-CM | POA: Diagnosis not present

## 2023-06-08 DIAGNOSIS — E782 Mixed hyperlipidemia: Secondary | ICD-10-CM

## 2023-06-08 DIAGNOSIS — I255 Ischemic cardiomyopathy: Secondary | ICD-10-CM | POA: Diagnosis not present

## 2023-06-08 DIAGNOSIS — I2510 Atherosclerotic heart disease of native coronary artery without angina pectoris: Secondary | ICD-10-CM

## 2023-06-08 DIAGNOSIS — I5022 Chronic systolic (congestive) heart failure: Secondary | ICD-10-CM

## 2023-06-08 DIAGNOSIS — Z79899 Other long term (current) drug therapy: Secondary | ICD-10-CM

## 2023-06-08 DIAGNOSIS — I10 Essential (primary) hypertension: Secondary | ICD-10-CM

## 2023-06-08 MED ORDER — EMPAGLIFLOZIN 10 MG PO TABS: 10.0000 mg | ORAL_TABLET | Freq: Every day | ORAL | 0 refills | Status: DC

## 2023-06-08 NOTE — Patient Instructions (Signed)
Medication Instructions:  Your physician recommends that you continue on your current medications as directed. Please refer to the Current Medication list given to you today.   *If you need a refill on your cardiac medications before your next appointment, please call your pharmacy*   Lab Work: Your provider would like for you to return tomorrow to have the following labs drawn: (Lipid, LFT).   Please go to [facility] [address] ([facility] [address] [address], [place] [address] You do not need an appointment.  They are open from 7:30 am-4 pm.  Lunch from 1:00 pm- 2:00 pm You will need to be fasting.     If you have labs (blood work) drawn today and your tests are completely normal, you will receive your results only by: MyChart Message (if you have MyChart) OR A paper copy in the mail If you have any lab test that is abnormal or we need to change your treatment, we will call you to review the results.   Testing/Procedures: Your physician has requested that you have an echocardiogram in 2 months. Echocardiography is a painless test that uses sound waves to create images of your heart. It provides your doctor with information about the size and shape of your heart and how well your heart's chambers and valves are working.   You may receive an ultrasound enhancing agent through an IV if needed to better visualize your heart during the echo. This procedure takes approximately one hour.  There are no restrictions for this procedure.  This will take place at [address] ([facility] [address], [place] [address]    Follow-Up: At [facility], you and your health needs are our priority.  As part of our continuing mission to provide you with exceptional heart care, we have created designated Provider Care Teams.  These Care Teams include your primary Cardiologist (physician) and Advanced Practice Providers (APPs -  Physician Assistants and Nurse  Practitioners) who all work together to provide you with the care you need, when you need it.  We recommend signing up for the patient portal called "MyChart".  Sign up information is provided on this After Visit Summary.  MyChart is used to connect with patients for Virtual Visits (Telemedicine).  Patients are able to view lab/test results, encounter notes, upcoming appointments, etc.  Non-urgent messages can be sent to your provider as well.   To learn more about what you can do with MyChart, go to [email or web address].    Your next appointment:   3 month(s)  Provider:   You may see Yvonne Kendall, MD or one of the following Advanced Practice Providers on your designated Care Team:   Nicolasa Ducking, NP Eula Listen, PA-C Cadence Fransico Michael, PA-C Charlsie Quest, NP

## 2023-06-08 NOTE — Progress Notes (Signed)
Cardiology Office Note:    Date:  06/08/2023   ID:  Connor Haas., DOB 1956/02/15, MRN [id number]  PCP:  [facility]  [facility] Cardiologist:  Yvonne Kendall, MD  [facility] Electrophysiologist:  None   Referring MD: [facility]   Chief Complaint: 1 month follow-up  History of Present Illness:    Connor Haas. is a 67 y.o. male with a hx of HTN, HLD, prediabetic, anxiety, chronic pain, vasovagal syncope, CAD s/p DES LAD, HFrEF, ICM, who presents for 1 month follow-up.    Patient was recently admitted 05/07/2021 with non-STEMI.  Troponins peaked at 4000.  Cardiac cath showed CTO of the LAD with subsequent DES to proximal/mid LAD. He had residual disease 20 to 30% ostial LAD and 30% mid left circumflex stenosis. He was started on DAPT with aspirin and ticagrelor for at least 12 months. Echo showed newly reduced pump function of 30% with normal RV function, hypokinesis of the mid to distal, inferior, inferoseptal, and apical walls. Patient had multiple episodes of relatively slow VT post revascularization.  Patient was started on Coreg, [place], Jardiance.  Patient was last seen July 2024 for hospital follow-up.  Patient was overall doing well with no chest pain or shortness of breath.  Patient was referred to cardiac rehab.  Patient was hypotensive and Entresto was changed to losartan.  .  Today, the patient is overall doing well. He is doing cardiac rehab. No chest pain or shortness of breath. He is seeing a nutritionist. He is almost back to baseline. No lower leg edema.    Past Medical History:  Diagnosis Date   Acute appendicitis 10/05/2013   Lap Appy 10/05/13    Anxiety    Chronic pain    Degenerative joint disease    Depression    GERD (gastroesophageal reflux disease)    Hyperlipidemia    Hypertension    Pneumonia     Past Surgical History:  Procedure Laterality Date   APPENDECTOMY     CORONARY STENT INTERVENTION N/A  05/01/2023   Procedure: CORONARY STENT INTERVENTION;  Surgeon: Yvonne Kendall, MD;  Location: [facility];  Service: Cardiovascular;  Laterality: N/A;   LAPAROSCOPIC APPENDECTOMY N/A 10/05/2013   Procedure: APPENDECTOMY LAPAROSCOPIC;  Surgeon: Valarie Merino, MD;  Location: [facility];  Service: General;  Laterality: N/A;   LEFT HEART CATH AND CORONARY ANGIOGRAPHY N/A 05/01/2023   Procedure: LEFT HEART CATH AND CORONARY ANGIOGRAPHY;  Surgeon: Yvonne Kendall, MD;  Location: [facility];  Service: Cardiovascular;  Laterality: N/A;    Current Medications: Current Meds  Medication Sig   ALPRAZolam (XANAX) 1 MG tablet Take 0.5 mg by mouth 3 (three) times daily as needed for sleep. For anxiety   aspirin EC 81 MG tablet Take 1 tablet (81 mg total) by mouth daily. Swallow whole.   carvedilol (COREG) 3.125 MG tablet Take 1 tablet (3.125 mg total) by mouth 2 (two) times daily with a meal.   loperamide (IMODIUM A-D) 2 MG tablet Take 2 mg by mouth 4 (four) times daily as needed for diarrhea or loose stools.   losartan (COZAAR) 25 MG tablet Take 0.5 tablets (12.5 mg total) by mouth daily.   nitroGLYCERIN (NITROSTAT) 0.4 MG SL tablet Place 1 tablet (0.4 mg total) under the tongue every 5 (five) minutes x 3 doses as needed for chest pain.   rosuvastatin (CRESTOR) 40 MG tablet Take 1 tablet (40 mg total) by mouth daily.  ticagrelor (BRILINTA) 90 MG TABS tablet Take 1 tablet (90 mg total) by mouth 2 (two) times daily.   [DISCONTINUED] empagliflozin (JARDIANCE) 10 MG TABS tablet Take 1 tablet (10 mg total) by mouth daily.     Allergies:   Penicillins, Penicillins, Sertraline hcl, Zoloft [sertraline], and Oxycodone   Social History   Socioeconomic History   Marital status: Married    Spouse name: Not on file   Number of children: Not on file   Years of education: Not on file   Highest education level: Not on file  Occupational History   Not on file  Tobacco Use   Smoking status: Former     Current packs/day: 0.00    Types: Cigarettes    Quit date: 10/12/2012    Years since quitting: 10.6   Smokeless tobacco: Never  Vaping Use   Vaping status: Never Used  Substance and Sexual Activity   Alcohol use: Not Currently    Comment: Nothing since 2014   Drug use: Yes    Types: Marijuana   Sexual activity: Not on file  Other Topics Concern   Not on file  Social History Narrative   ** Merged History Encounter **       Social Determinants of Health   Financial Resource Strain: Not on file  Food Insecurity: No Food Insecurity (05/02/2023)   Hunger Vital Sign    Worried About Running Out of Food in the Last Year: Never true    Ran Out of Food in the Last Year: Never true  Transportation Needs: No Transportation Needs (05/02/2023)   PRAPARE - Administrator, Civil Service (Medical): No    Lack of Transportation (Non-Medical): No  Physical Activity: Not on file  Stress: Not on file  Social Connections: Not on file     Family History: The patient's family history includes CAD in his father; Colon cancer in his maternal grandmother; Diverticulitis in his father; Heart disease (age of onset: 32) in his father; Stroke in his mother. There is no history of Colon polyps.  ROS:   Please see the history of present illness.     All other systems reviewed and are negative.  EKGs/Labs/Other Studies Reviewed:    The following studies were reviewed today:  Echo 04/2023 1. Extremely poor acoustic windows limit study, even with Definity use.  There is akinesis of the mid/distal inferior, inferoseptal and apical  walls. Distal anterior wall is hypokinetic. Left ventricular ejection  fraction, by estimation, is 30%. The left   ventricle has severely decreased function. There is mild left ventricular  hypertrophy.   2. Right ventricular systolic function is normal. The right ventricular  size is normal.   3. Trivial mitral valve regurgitation.   4. The aortic valve is  normal in structure. Aortic valve regurgitation is  not visualized.   5. The inferior vena cava is normal in size with greater than 50%  respiratory variability, suggesting right atrial pressure of 3 mmHg.      LHC 04/2023 Conclusions: Severe single-vessel coronary artery with thrombotic occlusion of proximal/mid LAD.  There are also 20-30% ostial LAD and 30% mid LCx stenoses.  No significant disease observed in nondominant RCA. Severely reduced left ventricular systolic function (LVEF 35-35% with mid/apical anterior and apical inferior akinesis). Mildly elevated left ventricular filling pressure (LVEDP 24 mmHg). Successful PCI to proximal/mid LAD using Synergy XD 3.0 x 28 mm drug-eluting stent with 0% residual stenosis and TIMI-2 flow in the distal LAD (suspect  sluggish distal flow is due to microvascular dysfunction).   Recommendations: Admit to 2-Heart ICU. Continue cangrelor infusion for 2 hours. Dual antiplatelet therapy with aspirin and ticagrelor for at least 12 months. Gentle diuresis. Initiate goal-directed medical therapy for acute HFrEF due to ischemic cardiomyopathy tomorrow as heart rate, blood pressure, and renal function allow. Follow-up echocardiogram. Aggressive secondary prevention of coronary artery disease.   Yvonne Kendall, MD [facility]     Recommendations       Antiplatelet/Anticoag Recommend uninterrupted dual antiplatelet therapy with Aspirin 81mg  daily and Ticagrelor 90mg  twice daily for a minimum of 12 months (ACS-Class I recommendation).  Discharge Date In the absence of any other complications or medical issues, we expect the patient to be ready for discharge from an interventional cardiology perspective on 05/03/2023.      Coronary Diagrams   Diagnostic Dominance: Left  Intervention       EKG:  EKG is not ordered today.    Recent Labs: 05/02/2023: ALT 115 05/03/2023: Magnesium 2.0 05/10/2023: BUN 21; Creatinine, Ser 1.26; Hemoglobin 15.2;  Platelets 253; Potassium 4.1; Sodium 137  Recent Lipid Panel    Component Value Date/Time   CHOL 143 05/02/2023 0257   TRIG 247 (H) 05/02/2023 0257   HDL 48 05/02/2023 0257   CHOLHDL 3.0 05/02/2023 0257   VLDL 49 (H) 05/02/2023 0257   LDLCALC 46 05/02/2023 0257   LDLDIRECT 149.4 06/26/2010 0000    Physical Exam:    VS:  BP 100/60 (BP Location: Left Arm, Patient Position: Sitting, Cuff Size: Normal)   Pulse 68   Ht 6\' 1"  (1.854 m)   Wt 217 lb 12.8 oz (98.8 kg)   SpO2 98%   BMI 28.74 kg/m     Wt Readings from Last 3 Encounters:  06/08/23 217 lb 12.8 oz (98.8 kg)  05/26/23 217 lb 14.4 oz (98.8 kg)  05/10/23 220 lb 6.4 oz (100 kg)     GEN:  Well nourished, well developed in no acute distress HEENT: Normal NECK: No JVD; No carotid bruits LYMPHATICS: No lymphadenopathy CARDIAC: RRR, no murmurs, rubs, gallops RESPIRATORY:  Clear to auscultation without rales, wheezing or rhonchi  ABDOMEN: Soft, non-tender, non-distended MUSCULOSKELETAL:  No edema; No deformity  SKIN: Warm and dry NEUROLOGIC:  Alert and oriented x 3 PSYCHIATRIC:  Normal affect   ASSESSMENT:    1. NSTEMI (non-ST elevated myocardial infarction) (HCC)   2. Coronary artery disease involving native coronary artery of native heart without angina pectoris   3. Chronic systolic heart failure (HCC)   4. Ischemic cardiomyopathy   5. Essential hypertension   6. Hyperlipidemia, mixed   7. Medication management    PLAN:    In order of problems listed above:  NSTEMI CAD s/p Des p-mLAD Prior hospitalization for non-STEMI.  Cardiac cath showed CTO of the LAD with subsequent DES to the proximal/mid LAD.  Otherwise nonobstructive disease.  Patient is on DAPT with aspirin and Brilinta x 12 months.  We will give him samples of Brilinta today.  Patient denies any anginal symptoms.  He is doing cardiac rehab without issues.  Continue aspirin, Brilinta, statin, Coreg.  HFrEF ICM Echo during hospitalization showed EF of  30%.  Patient is euvolemic on exam.  Continue Coreg, Entresto, and Jardiance.  Soft blood pressure limiting guideline directed medical therapy.  I will repeat an echocardiogram to assess EF.  We will provide samples of Jardiance today.  HTN Blood pressure borderline at 100/60.  Continue current medications.  HLD I will  update lipids and LFTs.  Prior LDL 46, triglycerides 247.  Continue Crestor 40 mg daily.  Disposition: Follow up in 3 month(s) with MD/APP    Signed, Netty Sullivant David Stall, PA-C  06/08/2023 12:21 PM    [facility]

## 2023-06-09 ENCOUNTER — Encounter: Payer: Medicare Other | Admitting: *Deleted

## 2023-06-09 DIAGNOSIS — I214 Non-ST elevation (NSTEMI) myocardial infarction: Secondary | ICD-10-CM | POA: Diagnosis not present

## 2023-06-09 DIAGNOSIS — Z955 Presence of coronary angioplasty implant and graft: Secondary | ICD-10-CM

## 2023-06-09 NOTE — Progress Notes (Signed)
Daily Session Note  Patient Details  Name: Connor Haas. MRN: [id number] Date of Birth: 12-22-1955 Referring Provider:   Flowsheet Row Cardiac Rehab from 05/26/2023 in [facility] Cardiac and Pulmonary Rehab  Referring Provider End       Encounter Date: 06/09/2023  Check In:  Session Check In - 06/09/23 1650       Check-In   Supervising physician immediately available to respond to emergencies See telemetry face sheet for immediately available ER MD    Location [facility]-Cardiac & Pulmonary Rehab    Staff Present Bess Kinds RN, BSN;Joseph Hood, RCP,RRT,BSRT;Maxon [place] BS, , Exercise Physiologist    Virtual Visit No    Medication changes reported     No    Fall or balance concerns reported    No    Warm-up and Cool-down Performed on first and last piece of equipment    Resistance Training Performed Yes    VAD Patient? No    PAD/SET Patient? No      Pain Assessment   Currently in Pain? No/denies                Social History   Tobacco Use  Smoking Status Former   Current packs/day: 0.00   Types: Cigarettes   Quit date: 10/12/2012   Years since quitting: 10.6  Smokeless Tobacco Never    Goals Met:  Independence with exercise equipment Exercise tolerated well No report of concerns or symptoms today Strength training completed today  Goals Unmet:  Not Applicable  Comments: Pt able to follow exercise prescription today without complaint.  Will continue to monitor for progression.    Dr. Bethann Punches is Medical Director for [facility] Cardiac Rehabilitation.  Dr. Vida Rigger is Medical Director for [facility] Pulmonary Rehabilitation.

## 2023-06-10 ENCOUNTER — Other Ambulatory Visit
Admission: RE | Admit: 2023-06-10 | Discharge: 2023-06-10 | Disposition: A | Discharge status: Home / Self Care | Payer: Medicare Other | Attending: Medical | Admitting: Medical

## 2023-06-10 ENCOUNTER — Encounter: Payer: Medicare Other | Admitting: *Deleted

## 2023-06-10 DIAGNOSIS — I214 Non-ST elevation (NSTEMI) myocardial infarction: Secondary | ICD-10-CM | POA: Diagnosis not present

## 2023-06-10 DIAGNOSIS — Z955 Presence of coronary angioplasty implant and graft: Secondary | ICD-10-CM

## 2023-06-10 DIAGNOSIS — Z79899 Other long term (current) drug therapy: Secondary | ICD-10-CM | POA: Diagnosis present

## 2023-06-10 LAB — HEPATIC FUNCTION PANEL
ALT: 42 U/L (ref 0–44)
AST: 41 U/L (ref 15–41)
Albumin: 4 g/dL (ref 3.5–5.0)
Alkaline Phosphatase: 83 U/L (ref 38–126)
Bilirubin, Direct: 0.1 mg/dL (ref 0.0–0.2)
Indirect Bilirubin: 0.8 mg/dL (ref 0.3–0.9)
Total Bilirubin: 0.9 mg/dL (ref 0.3–1.2)
Total Protein: 7.1 g/dL (ref 6.5–8.1)

## 2023-06-10 LAB — LIPID PANEL
Cholesterol: 97 mg/dL (ref 0–200)
HDL: 41 mg/dL (ref >40)
LDL Cholesterol: 32 mg/dL (ref 0–99)
Total CHOL/HDL Ratio: 2.4 RATIO
Triglycerides: 119 mg/dL (ref <150)
VLDL: 24 mg/dL (ref 0–40)

## 2023-06-10 NOTE — Progress Notes (Signed)
Daily Session Note  Patient Details  Name: Connor Haas. MRN: [id number] Date of Birth: 1956/03/04 Referring Provider:   Flowsheet Row Cardiac Rehab from 05/26/2023 in [facility] Cardiac and Pulmonary Rehab  Referring Provider End       Encounter Date: 06/10/2023  Check In:  Session Check In - 06/10/23 1341       Check-In   Supervising physician immediately available to respond to emergencies See telemetry face sheet for immediately available ER MD    Location [facility]-Cardiac & Pulmonary Rehab    Staff Present Cyndia Diver, RN, BSN, MA;Maxon [place] BS, , Exercise Physiologist;Joseph [place], [place]    Virtual Visit No    Medication changes reported     No    Fall or balance concerns reported    No    Tobacco Cessation No Change    Warm-up and Cool-down Performed on first and last piece of equipment    Resistance Training Performed Yes    VAD Patient? No    PAD/SET Patient? No      Pain Assessment   Currently in Pain? No/denies                Social History   Tobacco Use  Smoking Status Former   Current packs/day: 0.00   Types: Cigarettes   Quit date: 10/12/2012   Years since quitting: 10.6  Smokeless Tobacco Never    Goals Met:  Independence with exercise equipment Exercise tolerated well No report of concerns or symptoms today  Goals Unmet:  Not Applicable  Comments: Pt able to follow exercise prescription today without complaint.  Will continue to monitor for progression.    Dr. Bethann Punches is Medical Director for [facility] Cardiac Rehabilitation.  Dr. Vida Rigger is Medical Director for [facility] Pulmonary Rehabilitation.

## 2023-06-16 ENCOUNTER — Encounter: Payer: Self-pay | Admitting: *Deleted

## 2023-06-16 ENCOUNTER — Encounter: Payer: Medicare Other | Attending: Internal Medicine | Admitting: *Deleted

## 2023-06-16 DIAGNOSIS — Z5189 Encounter for other specified aftercare: Secondary | ICD-10-CM | POA: Insufficient documentation

## 2023-06-16 DIAGNOSIS — I252 Old myocardial infarction: Secondary | ICD-10-CM | POA: Insufficient documentation

## 2023-06-16 DIAGNOSIS — Z955 Presence of coronary angioplasty implant and graft: Secondary | ICD-10-CM | POA: Diagnosis not present

## 2023-06-16 DIAGNOSIS — I214 Non-ST elevation (NSTEMI) myocardial infarction: Secondary | ICD-10-CM

## 2023-06-16 NOTE — Progress Notes (Signed)
Daily Session Note  Patient Details  Name: Connor Haas. MRN: [id number] Date of Birth: [birth year] Referring Provider:   Flowsheet Row Cardiac Rehab from 05/26/2023 in [facility] Cardiac and Pulmonary Rehab  Referring Provider End       Encounter Date: 06/16/2023  Check In:  Session Check In - 06/16/23 1608       Check-In   Supervising physician immediately available to respond to emergencies See telemetry face sheet for immediately available ER MD    Location [facility]-Cardiac & Pulmonary Rehab    Staff Present Susann Givens, RN BSN;Joseph [place], RCP,RRT,BSRT;Kelly [place], [place], ACSM CEP, Exercise Physiologist    Virtual Visit No    Medication changes reported     No    Fall or balance concerns reported    No    Warm-up and Cool-down Performed on first and last piece of equipment    Resistance Training Performed Yes    VAD Patient? No    PAD/SET Patient? No      Pain Assessment   Currently in Pain? No/denies                Social History   Tobacco Use  Smoking Status Former   Current packs/day: 0.00   Types: Cigarettes   Quit date: 10/12/2012   Years since quitting: 10.6  Smokeless Tobacco Never    Goals Met:  Independence with exercise equipment Exercise tolerated well No report of concerns or symptoms today Strength training completed today  Goals Unmet:  Not Applicable  Comments: Pt able to follow exercise prescription today without complaint.  Will continue to monitor for progression.    Dr. Bethann Punches is Medical Director for [facility] Cardiac Rehabilitation.  Dr. Vida Rigger is Medical Director for [facility] Pulmonary Rehabilitation.

## 2023-06-16 NOTE — Progress Notes (Signed)
Cardiac Individual Treatment Plan  Patient Details  Name: Connor Haas. MRN: [id number] Date of Birth: Sep 13, 1956 Referring Provider:   Flowsheet Row Cardiac Rehab from 05/26/2023 in [facility] Cardiac and Pulmonary Rehab  Referring Provider End       Initial Encounter Date:  Flowsheet Row Cardiac Rehab from 05/26/2023 in [facility] Cardiac and Pulmonary Rehab  Date 05/26/23       Visit Diagnosis: NSTEMI (non-ST elevation myocardial infarction) [facility])  Patient's Home Medications on Admission:  Current Outpatient Medications:    ALPRAZolam (XANAX) 1 MG tablet, Take 0.5 mg by mouth 3 (three) times daily as needed for sleep. For anxiety, Disp: , Rfl:    aspirin EC 81 MG tablet, Take 1 tablet (81 mg total) by mouth daily. Swallow whole., Disp: 120 tablet, Rfl: 3   carvedilol (COREG) 3.125 MG tablet, Take 1 tablet (3.125 mg total) by mouth 2 (two) times daily with a meal., Disp: 60 tablet, Rfl: 11   empagliflozin (JARDIANCE) 10 MG TABS tablet, Take 1 tablet (10 mg total) by mouth daily., Disp: 7 tablet, Rfl: 0   loperamide (IMODIUM A-D) 2 MG tablet, Take 2 mg by mouth 4 (four) times daily as needed for diarrhea or loose stools., Disp: , Rfl:    losartan (COZAAR) 25 MG tablet, Take 0.5 tablets (12.5 mg total) by mouth daily., Disp: 45 tablet, Rfl: 3   nitroGLYCERIN (NITROSTAT) 0.4 MG SL tablet, Place 1 tablet (0.4 mg total) under the tongue every 5 (five) minutes x 3 doses as needed for chest pain., Disp: 25 tablet, Rfl: 1   rosuvastatin (CRESTOR) 40 MG tablet, Take 1 tablet (40 mg total) by mouth daily., Disp: 90 tablet, Rfl: 3   ticagrelor (BRILINTA) 90 MG TABS tablet, Take 1 tablet (90 mg total) by mouth 2 (two) times daily., Disp: 60 tablet, Rfl: 11  Past Medical History: Past Medical History:  Diagnosis Date   Acute appendicitis 10/05/2013   Lap Appy 10/05/13    Anxiety    Chronic pain    Degenerative joint disease    Depression    GERD (gastroesophageal reflux disease)     Hyperlipidemia    Hypertension    Pneumonia     Tobacco Use: Social History   Tobacco Use  Smoking Status Former   Current packs/day: 0.00   Types: Cigarettes   Quit date: 10/12/2012   Years since quitting: 10.6  Smokeless Tobacco Never    Labs: Review Flowsheet  More data may exist      Latest Ref Rng & Units 07/14/2007 06/26/2010 12/28/2012 05/02/2023 06/10/2023  Labs for ITP Cardiac and Pulmonary Rehab  Cholestrol 0 - 200 mg/dL 952  841  - 324  97   LDL (calc) 0 - 99 mg/dL - - - 46  32   Direct LDL mg/dL 401.0  272.5  - - -  HDL-C >40 mg/dL 36.6  44.03  - 48  41   Trlycerides <150 mg/dL 474  259.5  - 638  756   Hemoglobin A1c 4.8 - 5.6 % - - - 6.2  -  TCO2 0 - 100 mmol/L - - 26  - -    Details             Exercise Target Goals: Exercise Program Goal: Individual exercise prescription set using results from initial 6 min walk test and THRR while considering  patient's activity barriers and safety.   Exercise Prescription Goal: Initial exercise prescription builds to 30-45 minutes a day of aerobic  activity, 2-3 days per week.  Home exercise guidelines will be given to patient during program as part of exercise prescription that the participant will acknowledge.   Education: Aerobic Exercise: - Group verbal and visual presentation on the components of exercise prescription. Introduces F.I.T.T principle from ACSM for exercise prescriptions.  Reviews F.I.T.T. principles of aerobic exercise including progression. Written material given at graduation. Flowsheet Row Cardiac Rehab from 06/02/2023 in [facility] Cardiac and Pulmonary Rehab  Education need identified 05/26/23       Education: Resistance Exercise: - Group verbal and visual presentation on the components of exercise prescription. Introduces F.I.T.T principle from ACSM for exercise prescriptions  Reviews F.I.T.T. principles of resistance exercise including progression. Written material given at graduation. Flowsheet Row  Cardiac Rehab from 06/02/2023 in [facility] Cardiac and Pulmonary Rehab  Date 06/02/23  Educator [facility]  Instruction Review Code 1- [employer] Understanding        Education: Exercise & Equipment Safety: - Individual verbal instruction and demonstration of equipment use and safety with use of the equipment. Flowsheet Row Cardiac Rehab from 06/02/2023 in [facility] Cardiac and Pulmonary Rehab  Date 05/26/23  Educator [facility]  Instruction Review Code 1- Verbalizes Understanding       Education: Exercise Physiology & General Exercise Guidelines: - Group verbal and written instruction with models to review the exercise physiology of the cardiovascular system and associated critical values. Provides general exercise guidelines with specific guidelines to those with heart or lung disease.  Flowsheet Row Cardiac Rehab from 06/02/2023 in [facility] Cardiac and Pulmonary Rehab  Education need identified 05/26/23  Date 06/02/23  Educator [facility]  Instruction Review Code 1- [employer] Understanding       Education: Flexibility, Balance, Mind/Body Relaxation: - Group verbal and visual presentation with interactive activity on the components of exercise prescription. Introduces F.I.T.T principle from ACSM for exercise prescriptions. Reviews F.I.T.T. principles of flexibility and balance exercise training including progression. Also discusses the mind body connection.  Reviews various relaxation techniques to help reduce and manage stress (i.e. Deep breathing, progressive muscle relaxation, and visualization). Balance handout provided to take home. Written material given at graduation.   Activity Barriers & Risk Stratification:  Activity Barriers & Cardiac Risk Stratification - 05/26/23 1537       Activity Barriers & Cardiac Risk Stratification   Activity Barriers None    Cardiac Risk Stratification High             6 Minute Walk:  6 Minute Walk     Row Name 05/26/23 1535         6 Minute Walk   Phase Initial      Distance 1340 feet     Walk Time 6 minutes     # of Rest Breaks 0     MPH 2.54     METS 2.97     RPE 11     Perceived Dyspnea  0     VO2 Peak 10.4     Symptoms No     Resting HR 59 bpm     Resting BP 102/58     Resting Oxygen Saturation  99 %     Exercise Oxygen Saturation  during 6 min walk 100 %     Max Ex. HR 92 bpm     Max Ex. BP 122/54     2 Minute Post BP 102/58              Oxygen Initial Assessment:   Oxygen Re-Evaluation:   Oxygen Discharge (  Final Oxygen Re-Evaluation):   Initial Exercise Prescription:  Initial Exercise Prescription - 05/26/23 1500       Date of Initial Exercise RX and Referring Provider   Date 05/26/23    Referring Provider End      Oxygen   Maintain Oxygen Saturation 88% or higher      Treadmill   MPH 2.5    Grade 0.5    Minutes 15    METs 3.09      NuStep   Level 3    SPM 80    Minutes 15    METs 2.97      Biostep-RELP   Level 3    SPM 50    Minutes 15    METs 2.97      Prescription Details   Frequency (times per week) 3    Duration Progress to 30 minutes of continuous aerobic without signs/symptoms of physical distress      Intensity   THRR 40-80% of Max Heartrate 96-134    Ratings of Perceived Exertion 11-15    Perceived Dyspnea 0-4      Progression   Progression Continue to progress workloads to maintain intensity without signs/symptoms of physical distress.      Resistance Training   Training Prescription Yes    Weight 4lb    Reps 10-15             Perform Capillary Blood Glucose checks as needed.  Exercise Prescription Changes:   Exercise Prescription Changes     Row Name 05/26/23 1500             Response to Exercise   Blood Pressure (Admit) 102/58       Blood Pressure (Exercise) 122/54       Blood Pressure (Exit) 102/58       Heart Rate (Admit) 59 bpm       Heart Rate (Exercise) 92 bpm       Heart Rate (Exit) 74 bpm       Oxygen Saturation (Admit) 99 %       Oxygen  Saturation (Exercise) 100 %       Oxygen Saturation (Exit) 100 %       Rating of Perceived Exertion (Exercise) 11       Perceived Dyspnea (Exercise) 0       Symptoms none       Comments results         Progression   Progression Continue to progress workloads to maintain intensity without signs/symptoms of physical distress.                Exercise Comments:   Exercise Comments     Row Name 05/31/23 1542           Exercise Comments First full day of exercise!  Patient was oriented to gym and equipment including functions, settings, policies, and procedures.  Patient's individual exercise prescription and treatment plan were reviewed.  All starting workloads were established based on the results of the 6 minute walk test done at initial orientation visit.  The plan for exercise progression was also introduced and progression will be customized based on patient's performance and goals.'                Exercise Goals and Review:   Exercise Goals     Row Name 05/26/23 1601             Exercise Goals   Increase Physical Activity Yes  Intervention Develop an individualized exercise prescription for aerobic and resistive training based on initial evaluation findings, risk stratification, comorbidities and participant's personal goals.;Provide advice, education, support and counseling about physical activity/exercise needs.       Expected Outcomes Short Term: Attend rehab on a regular basis to increase amount of physical activity.;Long Term: Add in home exercise to make exercise part of routine and to increase amount of physical activity.;Long Term: Exercising regularly at least 3-5 days a week.       Increase Strength and Stamina Yes       Expected Outcomes Short Term: Increase workloads from initial exercise prescription for resistance, speed, and METs.;Short Term: Perform resistance training exercises routinely during rehab and add in resistance training at  home;Long Term: Improve cardiorespiratory fitness, muscular endurance and strength as measured by increased METs and functional capacity ( )       Able to understand and use rate of perceived exertion (RPE) scale Yes       Intervention Provide education and explanation on how to use RPE scale       Expected Outcomes Short Term: Able to use RPE daily in rehab to express subjective intensity level;Long Term:  Able to use RPE to guide intensity level when exercising independently       Able to understand and use Dyspnea scale Yes       Intervention Provide education and explanation on how to use Dyspnea scale       Expected Outcomes Short Term: Able to use Dyspnea scale daily in rehab to express subjective sense of shortness of breath during exertion;Long Term: Able to use Dyspnea scale to guide intensity level when exercising independently       Knowledge and understanding of Target Heart Rate Range (THRR) Yes       Intervention Provide education and explanation of THRR including how the numbers were predicted and where they are located for reference       Expected Outcomes Short Term: Able to state/look up THRR;Short Term: Able to use daily as guideline for intensity in rehab;Long Term: Able to use THRR to govern intensity when exercising independently       Able to check pulse independently Yes       Intervention Provide education and demonstration on how to check pulse in carotid and radial arteries.;Review the importance of being able to check your own pulse for safety during independent exercise       Expected Outcomes Short Term: Able to explain why pulse checking is important during independent exercise;Long Term: Able to check pulse independently and accurately       Understanding of Exercise Prescription Yes       Intervention Provide education, explanation, and written materials on patient's individual exercise prescription       Expected Outcomes Short Term: Able to explain program  exercise prescription;Long Term: Able to explain home exercise prescription to exercise independently                Exercise Goals Re-Evaluation :  Exercise Goals Re-Evaluation     Row Name 05/31/23 1542             Exercise Goal Re-Evaluation   Exercise Goals Review Increase Physical Activity;Able to understand and use rate of perceived exertion (RPE) scale;Knowledge and understanding of Target Heart Rate Range (THRR);Understanding of Exercise Prescription;Increase Strength and Stamina;Able to check pulse independently       Comments Reviewed RPE  and dyspnea scale, THR and program prescription with  pt today.  Pt voiced understanding and was given a copy of goals to take home.       Expected Outcomes Short: Use RPE daily to regulate intensity.  Long: Follow program prescription in THR.                Discharge Exercise Prescription (Final Exercise Prescription Changes):  Exercise Prescription Changes - 05/26/23 1500       Response to Exercise   Blood Pressure (Admit) 102/58    Blood Pressure (Exercise) 122/54    Blood Pressure (Exit) 102/58    Heart Rate (Admit) 59 bpm    Heart Rate (Exercise) 92 bpm    Heart Rate (Exit) 74 bpm    Oxygen Saturation (Admit) 99 %    Oxygen Saturation (Exercise) 100 %    Oxygen Saturation (Exit) 100 %    Rating of Perceived Exertion (Exercise) 11    Perceived Dyspnea (Exercise) 0    Symptoms none    Comments results      Progression   Progression Continue to progress workloads to maintain intensity without signs/symptoms of physical distress.             Nutrition:  Target Goals: Understanding of nutrition guidelines, daily intake of sodium 1500mg , cholesterol 200mg , calories 30% from fat and 7% or less from saturated fats, daily to have 5 or more servings of fruits and vegetables.  Education: All About Nutrition: -Group instruction provided by verbal, written material, interactive activities, discussions, models, and  posters to present general guidelines for heart healthy nutrition including fat, fiber, MyPlate, the role of sodium in heart healthy nutrition, utilization of the nutrition label, and utilization of this knowledge for meal planning. Follow up email sent as well. Written material given at graduation.   Biometrics:  Pre Biometrics - 05/26/23 1549       Pre Biometrics   Height 6' 0.5" (1.842 m)    Weight 217 lb 14.4 oz (98.8 kg)    Waist Circumference 43.4 inches    Hip Circumference 44.5 inches    Waist to Hip Ratio 0.98 %    BMI (Calculated) 29.13    Single Leg Stand 3.59 seconds              Nutrition Therapy Plan and Nutrition Goals:  Nutrition Therapy & Goals - 05/26/23 1633       Nutrition Therapy   Diet Carb controlled, cardiac, low Na    Protein (specify units) 90    Fiber 30 grams    Whole Grain Foods 3 servings    Saturated Fats 15 max. grams    Fruits and Vegetables 5 servings/day    Sodium 2 grams      Personal Nutrition Goals   Nutrition Goal Eat a protein at ever meal    Personal Goal #2 Watch sodium intake, read labels of food ate often    Personal Goal #3 Watch portions and include veggies at larger meals    Comments Patient and wife present today, they have begun making changes likes choosing leaner meats, smaller portions, and watching sodium. He is drinking ~64oz of water daily. Some juice, cut out sweet tea. Spoke with them about sugary beverages and to work on reducing and cutting them out. Reviewed his A1C and carb sources, portions, and goal of 30-60g per meal. Went over [employer] handout, types of fats, sources, and how to read labels. Built out meals and snacks with foods he likes focusing on pairing a controlled  amount of carbs with healthy protein and including veggies at larger meals.      Intervention Plan   Intervention Prescribe, educate and counsel regarding individualized specific dietary modifications aiming towards targeted core  components such as weight, hypertension, lipid management, diabetes, heart failure and other comorbidities.;Nutrition handout(s) given to patient.    Expected Outcomes Short Term Goal: Understand basic principles of dietary content, such as calories, fat, sodium, cholesterol and nutrients.;Short Term Goal: A plan has been developed with personal nutrition goals set during dietitian appointment.;Long Term Goal: Adherence to prescribed nutrition plan.             Nutrition Assessments:  MEDIFICTS Score Key: ?70 Need to make dietary changes  40-70 Heart Healthy Diet ? 40 Therapeutic Level Cholesterol Diet  Flowsheet Row Cardiac Rehab from 05/26/2023 in [facility] Cardiac and Pulmonary Rehab  Picture Your Plate Total Score on Admission 69      Picture Your Plate Scores: <16 Unhealthy dietary pattern with much room for improvement. 41-50 Dietary pattern unlikely to meet recommendations for good health and room for improvement. 51-60 More healthful dietary pattern, with some room for improvement.  >60 Healthy dietary pattern, although there may be some specific behaviors that could be improved.    Nutrition Goals Re-Evaluation:   Nutrition Goals Discharge (Final Nutrition Goals Re-Evaluation):   Psychosocial: Target Goals: Acknowledge presence or absence of significant depression and/or stress, maximize coping skills, provide positive support system. Participant is able to verbalize types and ability to use techniques and skills needed for reducing stress and depression.   Education: Stress, Anxiety, and Depression - Group verbal and visual presentation to define topics covered.  Reviews how body is impacted by stress, anxiety, and depression.  Also discusses healthy ways to reduce stress and to treat/manage anxiety and depression.  Written material given at graduation.   Education: Sleep Hygiene -Provides group verbal and written instruction about how sleep can affect your health.   Define sleep hygiene, discuss sleep cycles and impact of sleep habits. Review good sleep hygiene tips.    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 Nothing stems his anxiety and is there. He stresses about alot of little things and life situations. His wife is a good support system for it.      Family Dynamics   Good Support System? Yes      Barriers   Psychosocial barriers to participate in program The patient should benefit from training in stress management and relaxation.;There are no identifiable barriers or psychosocial needs.      Screening Interventions   Interventions Encouraged to exercise;To provide support and resources with identified psychosocial needs;Provide feedback about the scores to participant    Expected Outcomes Short Term goal: Utilizing psychosocial counselor, staff and physician to assist with identification of specific Stressors or current issues interfering with healing process. Setting desired goal for each stressor or current issue identified.;Long Term Goal: Stressors or current issues are controlled or eliminated.;Short Term goal: Identification and review with participant of any Quality of Life or Depression concerns found by scoring the questionnaire.;Long Term goal: The participant improves quality of Life and PHQ9 Scores as seen by post scores and/or verbalization of changes             Quality  of Life Scores:   Quality of Life - 05/26/23 1551       Quality of Life   Select Quality of Life      Quality of Life Scores   Health/Function Pre 25.1 %    Socioeconomic Pre 20.13 %    Psych/Spiritual Pre 22.71 %    Family Pre 25.2 %    GLOBAL Pre 23.5 %            Scores of 19 and below usually indicate a poorer  quality of life in these areas.  A difference of  2-3 points is a clinically meaningful difference.  A difference of 2-3 points in the total score of the Quality of Life Index has been associated with significant improvement in overall quality of life, self-image, physical symptoms, and general health in studies assessing change in quality of life.  PHQ-9: Review Flowsheet       05/26/2023  Depression screen PHQ 2/9  Decreased Interest 0  Down, Depressed, Hopeless 1  PHQ - 2 Score 1  Altered sleeping 1  Tired, decreased energy 1  Change in appetite 0  Feeling bad or failure about yourself  0  Trouble concentrating 0  Moving slowly or fidgety/restless 1  Suicidal thoughts 0  PHQ-9 Score 4  Difficult doing work/chores Not difficult at all    Details           Interpretation of Total Score  Total Score Depression Severity:  1-4 = Minimal depression, 5-9 = Mild depression, 10-14 = Moderate depression, 15-19 = Moderately severe depression, 20-27 = Severe depression   Psychosocial Evaluation and Intervention:  Psychosocial Evaluation - 05/20/23 1556       Psychosocial Evaluation & Interventions   Interventions Encouraged to exercise with the program and follow exercise prescription;Relaxation education;Stress management education    Comments 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 Nothing stems his anxiety and is there. He stresses about alot of little things and life situations. His wife is a good support system for it.    Expected Outcomes Short: Start [facility] to help with mood. Long: Maintain a healthy mental state    Continue Psychosocial Services  Follow up required by staff             Psychosocial Re-Evaluation:   Psychosocial Discharge (Final Psychosocial Re-Evaluation):   Vocational Rehabilitation: Provide vocational rehab assistance to qualifying candidates.   Vocational Rehab Evaluation & Intervention:   Education: Education Goals:  Education classes will be provided on a variety of topics geared toward better understanding of heart health and risk factor modification. Participant will state understanding/return demonstration of topics presented as noted by education test scores.  Learning Barriers/Preferences:  Learning Barriers/Preferences - 05/20/23 1554       Learning Barriers/Preferences   Learning Barriers None    Learning Preferences None             General Cardiac Education Topics:  AED/CPR: - Group verbal and written instruction with the use of models to demonstrate the basic use of the AED with the basic ABC's of resuscitation.   Anatomy and Cardiac Procedures: - Group verbal and visual presentation and models provide information about basic cardiac anatomy and function. Reviews the testing methods done to diagnose heart disease and the outcomes of the test results. Describes the treatment choices: Medical Management, Angioplasty, or Coronary Bypass Surgery for treating various heart conditions including Myocardial Infarction, Angina, Valve Disease, and Cardiac Arrhythmias.  Written material given  at graduation.   Medication Safety: - Group verbal and visual instruction to review commonly prescribed medications for heart and lung disease. Reviews the medication, class of the drug, and side effects. Includes the steps to properly store meds and maintain the prescription regimen.  Written material given at graduation.   Intimacy: - Group verbal instruction through game format to discuss how heart and lung disease can affect sexual intimacy. Written material given at graduation..   Know Your Numbers and Heart Failure: - Group verbal and visual instruction to discuss disease risk factors for cardiac and pulmonary disease and treatment options.  Reviews associated critical values for Overweight/Obesity, Hypertension, Cholesterol, and Diabetes.  Discusses basics of heart failure: signs/symptoms and  treatments.  Introduces Heart Failure Zone chart for action plan for heart failure.  Written material given at graduation.   Infection Prevention: - Provides verbal and written material to individual with discussion of infection control including proper hand washing and proper equipment cleaning during exercise session. Flowsheet Row Cardiac Rehab from 06/02/2023 in [facility] Cardiac and Pulmonary Rehab  Date 05/26/23  Educator [facility]  Instruction Review Code 1- Verbalizes Understanding       Falls Prevention: - Provides verbal and written material to individual with discussion of falls prevention and safety. Flowsheet Row Cardiac Rehab from 06/02/2023 in [facility] Cardiac and Pulmonary Rehab  Date 05/26/23  Educator [facility]  Instruction Review Code 1- Verbalizes Understanding       Other: -Provides group and verbal instruction on various topics (see comments)   Knowledge Questionnaire Score:  Knowledge Questionnaire Score - 05/26/23 1556       Knowledge Questionnaire Score   Pre Score 25/26             Core Components/Risk Factors/Patient Goals at Admission:  Personal Goals and Risk Factors at Admission - 05/26/23 1559       Core Components/Risk Factors/Patient Goals on Admission    Weight Management Yes;Weight Loss    Intervention Weight Management: Develop a combined nutrition and exercise program designed to reach desired caloric intake, while maintaining appropriate intake of nutrient and fiber, sodium and fats, and appropriate energy expenditure required for the weight goal.;Weight Management: Provide education and appropriate resources to help participant work on and attain dietary goals.;Weight Management/Obesity: Establish reasonable short term and long term weight goals.;Obesity: Provide education and appropriate resources to help participant work on and attain dietary goals.    Admit Weight 217 lb 14.4 oz (98.8 kg)    Goal Weight: Short Term 210 lb (95.3 kg)    Goal Weight:  Long Term 200 lb (90.7 kg)    Expected Outcomes Short Term: Continue to assess and modify interventions until short term weight is achieved;Long Term: Adherence to nutrition and physical activity/exercise program aimed toward attainment of established weight goal;Weight Loss: Understanding of general recommendations for a balanced deficit meal plan, which promotes 1-2 lb weight loss per week and includes a negative energy balance of [phone] kcal/d;Understanding recommendations for meals to include 15-35% energy as protein, 25-35% energy from fat, 35-60% energy from carbohydrates, less than 200mg  of dietary cholesterol, 20-35 gm of total fiber daily;Understanding of distribution of calorie intake throughout the day with the consumption of 4-5 meals/snacks    Heart Failure Yes    Intervention Provide a combined exercise and nutrition program that is supplemented with education, support and counseling about heart failure. Directed toward relieving symptoms such as shortness of breath, decreased exercise tolerance, and extremity edema.    Expected Outcomes Improve  functional capacity of life;Short term: Attendance in program 2-3 days a week with increased exercise capacity. Reported lower sodium intake. Reported increased fruit and vegetable intake. Reports medication compliance.;Short term: Daily weights obtained and reported for increase. Utilizing diuretic protocols set by physician.;Long term: Adoption of self-care skills and reduction of barriers for early signs and symptoms recognition and intervention leading to self-care maintenance.    Hypertension Yes    Intervention Provide education on lifestyle modifcations including regular physical activity/exercise, weight management, moderate sodium restriction and increased consumption of fresh fruit, vegetables, and low fat dairy, alcohol moderation, and smoking cessation.;Monitor prescription use compliance.    Expected Outcomes Short Term: Continued  assessment and intervention until BP is < 140/66mm HG in hypertensive participants. < 130/41mm HG in hypertensive participants with diabetes, heart failure or chronic kidney disease.;Long Term: Maintenance of blood pressure at goal levels.    Lipids Yes    Intervention Provide education and support for participant on nutrition & aerobic/resistive exercise along with prescribed medications to achieve LDL 70mg , HDL >40mg .    Expected Outcomes Short Term: Participant states understanding of desired cholesterol values and is compliant with medications prescribed. Participant is following exercise prescription and nutrition guidelines.;Long Term: Cholesterol controlled with medications as prescribed, with individualized exercise RX and with personalized nutrition plan. Value goals: LDL < 70mg , HDL > 40 mg.             Education:Diabetes - Individual verbal and written instruction to review signs/symptoms of diabetes, desired ranges of glucose level fasting, after meals and with exercise. Acknowledge that pre and post exercise glucose checks will be done for 3 sessions at entry of program.   Core Components/Risk Factors/Patient Goals Review:    Core Components/Risk Factors/Patient Goals at Discharge (Final Review):    ITP Comments:  ITP Comments     Row Name 05/20/23 1553 05/26/23 1555 05/31/23 1542 06/16/23 0934     ITP Comments Virtual Visit completed. Patient informed on EP and RD appointment and 6 Minute walk test. Patient also informed of patient health questionnaires on My Chart. Patient Verbalizes understanding. Visit diagnosis can be found in [facility] 05/08/2023. Completed and gym orientation. Initial ITP created and sent for review to Dr. Bethann Punches, Medical Director. First full day of exercise!  Patient was oriented to gym and equipment including functions, settings, policies, and procedures.  Patient's individual exercise prescription and treatment plan were reviewed.  All starting  workloads were established based on the results of the 6 minute walk test done at initial orientation visit.  The plan for exercise progression was also introduced and progression will be customized based on patient's performance and goals.' 30 Day review completed. Medical Director ITP review done, changes made as directed, and signed approval by Medical Director.     new to program             Comments:

## 2023-06-17 ENCOUNTER — Encounter: Payer: Medicare Other | Admitting: *Deleted

## 2023-06-17 DIAGNOSIS — Z5189 Encounter for other specified aftercare: Secondary | ICD-10-CM | POA: Diagnosis not present

## 2023-06-17 DIAGNOSIS — I214 Non-ST elevation (NSTEMI) myocardial infarction: Secondary | ICD-10-CM

## 2023-06-17 DIAGNOSIS — Z955 Presence of coronary angioplasty implant and graft: Secondary | ICD-10-CM

## 2023-06-17 NOTE — Progress Notes (Signed)
Daily Session Note  Patient Details  Name: Connor Haas. MRN: [id number] Date of Birth: [birth year] Referring Provider:   Flowsheet Row Cardiac Rehab from 05/26/2023 in [facility] Cardiac and Pulmonary Rehab  Referring Provider End       Encounter Date: 06/17/2023  Check In:  Session Check In - 06/17/23 1558       Check-In   Supervising physician immediately available to respond to emergencies See telemetry face sheet for immediately available ER MD    Location [facility]-Cardiac & Pulmonary Rehab    Staff Present Maxon Suzzette Righter, , Exercise Physiologist;Keyion Knack Jewel Baize, RN BSN;Joseph Reino Kent, RCP,RRT,BSRT    Virtual Visit No    Medication changes reported     No    Fall or balance concerns reported    No    Warm-up and Cool-down Performed on first and last piece of equipment    Resistance Training Performed Yes    VAD Patient? No    PAD/SET Patient? No      Pain Assessment   Currently in Pain? No/denies                Social History   Tobacco Use  Smoking Status Former   Current packs/day: 0.00   Types: Cigarettes   Quit date: 10/12/2012   Years since quitting: 10.6  Smokeless Tobacco Never    Goals Met:  Independence with exercise equipment Exercise tolerated well No report of concerns or symptoms today Strength training completed today  Goals Unmet:  Not Applicable  Comments: Pt able to follow exercise prescription today without complaint.  Will continue to monitor for progression.    Dr. Bethann Punches is Medical Director for [facility] Cardiac Rehabilitation.  Dr. Vida Rigger is Medical Director for [facility] Pulmonary Rehabilitation.

## 2023-06-28 ENCOUNTER — Other Ambulatory Visit: Payer: Self-pay

## 2023-06-28 ENCOUNTER — Encounter: Payer: Medicare Other | Admitting: *Deleted

## 2023-06-28 DIAGNOSIS — Z955 Presence of coronary angioplasty implant and graft: Secondary | ICD-10-CM

## 2023-06-28 DIAGNOSIS — Z5189 Encounter for other specified aftercare: Secondary | ICD-10-CM | POA: Diagnosis not present

## 2023-06-28 DIAGNOSIS — I214 Non-ST elevation (NSTEMI) myocardial infarction: Secondary | ICD-10-CM

## 2023-06-28 MED ORDER — EMPAGLIFLOZIN 10 MG PO TABS
10.0000 mg | ORAL_TABLET | Freq: Every day | ORAL | 3 refills | Status: DC
  Filled 2023-06-28: qty 90, 90d supply, fill #0
  Filled 2023-06-29: qty 30, 30d supply, fill #0
  Filled 2023-07-27: qty 30, 30d supply, fill #1
  Filled 2023-08-28: qty 30, 30d supply, fill #2

## 2023-06-28 NOTE — Telephone Encounter (Signed)
Refill for Jardiance 10 mg daily sent to requested pharmacy

## 2023-06-28 NOTE — Progress Notes (Signed)
Daily Session Note  Patient Details  Name: Connor Haas. MRN: [id number] Date of Birth: 11-23-1955 Referring Provider:   Flowsheet Row Cardiac Rehab from 05/26/2023 in [facility] Cardiac and Pulmonary Rehab  Referring Provider End       Encounter Date: 06/28/2023  Check In:  Session Check In - 06/28/23 1609       Check-In   Supervising physician immediately available to respond to emergencies See telemetry face sheet for immediately available ER MD    Location [facility]-Cardiac & Pulmonary Rehab    Staff Present Maxon Suzzette Righter, , Exercise Physiologist;Makena Murdock Jewel Baize, RN BSN;Joseph Reino Kent, [place]    Virtual Visit No    Medication changes reported     No    Fall or balance concerns reported    No    Tobacco Cessation No Change    Warm-up and Cool-down Performed on first and last piece of equipment    Resistance Training Performed Yes    VAD Patient? No    PAD/SET Patient? No      Pain Assessment   Currently in Pain? No/denies                Social History   Tobacco Use  Smoking Status Former   Current packs/day: 0.00   Types: Cigarettes   Quit date: 10/12/2012   Years since quitting: 10.7  Smokeless Tobacco Never    Goals Met:  Independence with exercise equipment Exercise tolerated well No report of concerns or symptoms today Strength training completed today  Goals Unmet:  Not Applicable  Comments: Pt able to follow exercise prescription today without complaint.  Will continue to monitor for progression.    Dr. Bethann Punches is Medical Director for [facility] Cardiac Rehabilitation.  Dr. Vida Rigger is Medical Director for [facility] Pulmonary Rehabilitation.

## 2023-06-29 ENCOUNTER — Other Ambulatory Visit: Payer: Self-pay

## 2023-06-30 ENCOUNTER — Encounter: Payer: Medicare Other | Admitting: *Deleted

## 2023-06-30 DIAGNOSIS — Z955 Presence of coronary angioplasty implant and graft: Secondary | ICD-10-CM

## 2023-06-30 DIAGNOSIS — I214 Non-ST elevation (NSTEMI) myocardial infarction: Secondary | ICD-10-CM

## 2023-06-30 DIAGNOSIS — Z5189 Encounter for other specified aftercare: Secondary | ICD-10-CM | POA: Diagnosis not present

## 2023-06-30 NOTE — Progress Notes (Signed)
Daily Session Note  Patient Details  Name: Connor Haas. MRN: [id number] Date of Birth: 11/18/1955 Referring Provider:   Flowsheet Row Cardiac Rehab from 05/26/2023 in [facility] Cardiac and Pulmonary Rehab  Referring Provider End       Encounter Date: 06/30/2023  Check In:  Session Check In - 06/30/23 1606       Check-In   Supervising physician immediately available to respond to emergencies See telemetry face sheet for immediately available ER MD    Location [facility]-Cardiac & Pulmonary Rehab    Staff Present Maxon Conetta BS, , Exercise Physiologist;Joseph Rod Can, RN BSN    Virtual Visit No    Medication changes reported     No    Fall or balance concerns reported    No    Warm-up and Cool-down Performed on first and last piece of equipment    Resistance Training Performed Yes    VAD Patient? No    PAD/SET Patient? No      Pain Assessment   Currently in Pain? No/denies                Social History   Tobacco Use  Smoking Status Former   Current packs/day: 0.00   Types: Cigarettes   Quit date: 10/12/2012   Years since quitting: 10.7  Smokeless Tobacco Never    Goals Met:  Independence with exercise equipment Exercise tolerated well No report of concerns or symptoms today Strength training completed today  Goals Unmet:  Not Applicable  Comments: Pt able to follow exercise prescription today without complaint.  Will continue to monitor for progression.    Dr. Bethann Punches is Medical Director for [facility] Cardiac Rehabilitation.  Dr. Vida Rigger is Medical Director for [facility] Pulmonary Rehabilitation.

## 2023-07-01 ENCOUNTER — Other Ambulatory Visit: Payer: Self-pay | Admitting: Medical

## 2023-07-01 ENCOUNTER — Encounter: Payer: Medicare Other | Admitting: *Deleted

## 2023-07-01 ENCOUNTER — Ambulatory Visit: Payer: Medicare Other | Attending: Medical

## 2023-07-01 DIAGNOSIS — I2510 Atherosclerotic heart disease of native coronary artery without angina pectoris: Secondary | ICD-10-CM

## 2023-07-01 DIAGNOSIS — E782 Mixed hyperlipidemia: Secondary | ICD-10-CM

## 2023-07-01 DIAGNOSIS — I255 Ischemic cardiomyopathy: Secondary | ICD-10-CM

## 2023-07-01 DIAGNOSIS — Z5189 Encounter for other specified aftercare: Secondary | ICD-10-CM | POA: Diagnosis not present

## 2023-07-01 DIAGNOSIS — Z955 Presence of coronary angioplasty implant and graft: Secondary | ICD-10-CM

## 2023-07-01 DIAGNOSIS — I10 Essential (primary) hypertension: Secondary | ICD-10-CM

## 2023-07-01 DIAGNOSIS — Z79899 Other long term (current) drug therapy: Secondary | ICD-10-CM

## 2023-07-01 DIAGNOSIS — I5022 Chronic systolic (congestive) heart failure: Secondary | ICD-10-CM

## 2023-07-01 DIAGNOSIS — I214 Non-ST elevation (NSTEMI) myocardial infarction: Secondary | ICD-10-CM

## 2023-07-01 LAB — ECHOCARDIOGRAM LIMITED
Area-P 1/2: 3.08 cm2
S' Lateral: 4.2 cm

## 2023-07-01 MED ORDER — PERFLUTREN LIPID MICROSPHERE
1.0000 mL | INTRAVENOUS | Status: AC | PRN
Start: 2023-07-01 — End: 2023-07-01
  Administered 2023-07-01: 3 mL via INTRAVENOUS

## 2023-07-01 NOTE — Progress Notes (Signed)
Daily Session Note  Patient Details  Name: Connor Haas. MRN: [id number] Date of Birth: 1955-10-15 Referring Provider:   Flowsheet Row Cardiac Rehab from 05/26/2023 in [facility] Cardiac and Pulmonary Rehab  Referring Provider End       Encounter Date: 07/01/2023  Check In:  Session Check In - 07/01/23 1541       Check-In   Supervising physician immediately available to respond to emergencies See telemetry face sheet for immediately available ER MD    Location [facility]-Cardiac & Pulmonary Rehab    Staff Present Maxon Suzzette Righter, , Exercise Physiologist;Lallie Strahm Jewel Baize, RN BSN;Joseph Reino Kent, RCP,RRT,BSRT    Virtual Visit No    Medication changes reported     No    Fall or balance concerns reported    No    Warm-up and Cool-down Performed on first and last piece of equipment    Resistance Training Performed Yes    VAD Patient? No    PAD/SET Patient? No      Pain Assessment   Currently in Pain? No/denies                Social History   Tobacco Use  Smoking Status Former   Current packs/day: 0.00   Types: Cigarettes   Quit date: 10/12/2012   Years since quitting: 10.7  Smokeless Tobacco Never    Goals Met:  Independence with exercise equipment Exercise tolerated well No report of concerns or symptoms today Strength training completed today  Goals Unmet:  Not Applicable  Comments: Pt able to follow exercise prescription today without complaint.  Will continue to monitor for progression.    Dr. Bethann Punches is Medical Director for [facility] Cardiac Rehabilitation.  Dr. Vida Rigger is Medical Director for [facility] Pulmonary Rehabilitation.

## 2023-07-02 ENCOUNTER — Other Ambulatory Visit: Payer: Self-pay

## 2023-07-02 DIAGNOSIS — I5020 Unspecified systolic (congestive) heart failure: Secondary | ICD-10-CM

## 2023-07-07 ENCOUNTER — Encounter: Payer: Medicare Other | Admitting: *Deleted

## 2023-07-07 DIAGNOSIS — I214 Non-ST elevation (NSTEMI) myocardial infarction: Secondary | ICD-10-CM

## 2023-07-07 DIAGNOSIS — Z955 Presence of coronary angioplasty implant and graft: Secondary | ICD-10-CM

## 2023-07-07 DIAGNOSIS — Z5189 Encounter for other specified aftercare: Secondary | ICD-10-CM | POA: Diagnosis not present

## 2023-07-07 NOTE — Progress Notes (Signed)
Daily Session Note  Patient Details  Name: Connor Haas. MRN: [id number] Date of Birth: 1956/09/11 Referring Provider:   Flowsheet Row Cardiac Rehab from 05/26/2023 in [facility] Cardiac and Pulmonary Rehab  Referring Provider End       Encounter Date: 07/07/2023  Check In:  Session Check In - 07/07/23 1610       Check-In   Supervising physician immediately available to respond to emergencies See telemetry face sheet for immediately available ER MD    Location [facility]-Cardiac & Pulmonary Rehab    Staff Present Susann Givens, RN Mabeline Caras, BS, ACSM CEP, Exercise Physiologist;Megan Katrinka Blazing, RN, ADN    Virtual Visit No    Medication changes reported     No    Fall or balance concerns reported    No    Warm-up and Cool-down Performed on first and last piece of equipment    Resistance Training Performed Yes    VAD Patient? No    PAD/SET Patient? No      Pain Assessment   Currently in Pain? No/denies                Social History   Tobacco Use  Smoking Status Former   Current packs/day: 0.00   Types: Cigarettes   Quit date: 10/12/2012   Years since quitting: 10.7  Smokeless Tobacco Never    Goals Met:  Independence with exercise equipment Exercise tolerated well No report of concerns or symptoms today Strength training completed today  Goals Unmet:  Not Applicable  Comments: Pt able to follow exercise prescription today without complaint.  Will continue to monitor for progression.    Dr. Bethann Punches is Medical Director for [facility] Cardiac Rehabilitation.  Dr. Vida Rigger is Medical Director for [facility] Pulmonary Rehabilitation.

## 2023-07-08 ENCOUNTER — Telehealth: Payer: Self-pay | Admitting: Medical

## 2023-07-08 ENCOUNTER — Encounter: Payer: Medicare Other | Admitting: *Deleted

## 2023-07-08 DIAGNOSIS — I214 Non-ST elevation (NSTEMI) myocardial infarction: Secondary | ICD-10-CM

## 2023-07-08 DIAGNOSIS — Z955 Presence of coronary angioplasty implant and graft: Secondary | ICD-10-CM

## 2023-07-08 DIAGNOSIS — Z5189 Encounter for other specified aftercare: Secondary | ICD-10-CM | POA: Diagnosis not present

## 2023-07-08 NOTE — Telephone Encounter (Signed)
Patient dropped off disability parking placard placed in box

## 2023-07-08 NOTE — Telephone Encounter (Signed)
Forms placed in providers folder for review.

## 2023-07-08 NOTE — Progress Notes (Signed)
Daily Session Note  Patient Details  Name: Connor Haas. MRN: [id number] Date of Birth: 05/16/1956 Referring Provider:   Flowsheet Row Cardiac Rehab from 05/26/2023 in [facility] Cardiac and Pulmonary Rehab  Referring Provider End       Encounter Date: 07/08/2023  Check In:  Session Check In - 07/08/23 1537       Check-In   Supervising physician immediately available to respond to emergencies See telemetry face sheet for immediately available ER MD    Location [facility]-Cardiac & Pulmonary Rehab    Staff Present Susann Givens, RN BSN;Joseph Reino Kent, RCP,RRT,BSRT;Megan Katrinka Blazing, RN, [place]    Virtual Visit No    Medication changes reported     No    Fall or balance concerns reported    No    Warm-up and Cool-down Performed on first and last piece of equipment    Resistance Training Performed Yes    VAD Patient? No    PAD/SET Patient? No      Pain Assessment   Currently in Pain? No/denies                Social History   Tobacco Use  Smoking Status Former   Current packs/day: 0.00   Types: Cigarettes   Quit date: 10/12/2012   Years since quitting: 10.7  Smokeless Tobacco Never    Goals Met:  Independence with exercise equipment Exercise tolerated well No report of concerns or symptoms today Strength training completed today  Goals Unmet:  Not Applicable  Comments: Pt able to follow exercise prescription today without complaint.  Will continue to monitor for progression.    Dr. Bethann Punches is Medical Director for [facility] Cardiac Rehabilitation.  Dr. Vida Rigger is Medical Director for [facility] Pulmonary Rehabilitation.

## 2023-07-12 ENCOUNTER — Encounter: Payer: Medicare Other | Admitting: *Deleted

## 2023-07-12 DIAGNOSIS — I214 Non-ST elevation (NSTEMI) myocardial infarction: Secondary | ICD-10-CM

## 2023-07-12 DIAGNOSIS — Z955 Presence of coronary angioplasty implant and graft: Secondary | ICD-10-CM

## 2023-07-12 DIAGNOSIS — Z5189 Encounter for other specified aftercare: Secondary | ICD-10-CM | POA: Diagnosis not present

## 2023-07-12 NOTE — Progress Notes (Signed)
Daily Session Note  Patient Details  Name: Connor Haas. MRN: [id number] Date of Birth: 07-Feb-1956 Referring Provider:   Flowsheet Row Cardiac Rehab from 05/26/2023 in [facility] Cardiac and Pulmonary Rehab  Referring Provider End       Encounter Date: 07/12/2023  Check In:  Session Check In - 07/12/23 1603       Check-In   Supervising physician immediately available to respond to emergencies See telemetry face sheet for immediately available ER MD    Location [facility]-Cardiac & Pulmonary Rehab    Staff Present Maxon Suzzette Righter, , Exercise Physiologist;Khristine Verno Jewel Baize, RN BSN;Joseph Reino Kent, [place]    Virtual Visit No    Medication changes reported     No    Fall or balance concerns reported    No    Warm-up and Cool-down Performed on first and last piece of equipment    Resistance Training Performed Yes    VAD Patient? No    PAD/SET Patient? No      Pain Assessment   Currently in Pain? No/denies                Social History   Tobacco Use  Smoking Status Former   Current packs/day: 0.00   Types: Cigarettes   Quit date: 10/12/2012   Years since quitting: 10.7  Smokeless Tobacco Never    Goals Met:  Independence with exercise equipment Exercise tolerated well No report of concerns or symptoms today Strength training completed today  Goals Unmet:  Not Applicable  Comments: Pt able to follow exercise prescription today without complaint.  Will continue to monitor for progression.    Dr. Bethann Punches is Medical Director for [facility] Cardiac Rehabilitation.  Dr. Vida Rigger is Medical Director for [facility] Pulmonary Rehabilitation.

## 2023-07-14 ENCOUNTER — Encounter: Payer: Medicare Other | Attending: Internal Medicine | Admitting: *Deleted

## 2023-07-14 ENCOUNTER — Encounter: Payer: Self-pay | Admitting: *Deleted

## 2023-07-14 DIAGNOSIS — I214 Non-ST elevation (NSTEMI) myocardial infarction: Secondary | ICD-10-CM

## 2023-07-14 DIAGNOSIS — Z955 Presence of coronary angioplasty implant and graft: Secondary | ICD-10-CM | POA: Diagnosis not present

## 2023-07-14 NOTE — Telephone Encounter (Signed)
Forms completed by Cadence Lorna Few and ready for pick-up. Attempted to contact pt to make aware. Unable to leave message as mailbox is full.

## 2023-07-14 NOTE — Progress Notes (Signed)
Daily Session Note  Patient Details  Name: Connor Haas. MRN: [id number] Date of Birth: [birth year] Referring Provider:   Flowsheet Row Cardiac Rehab from 05/26/2023 in [facility] Cardiac and Pulmonary Rehab  Referring Provider End       Encounter Date: 07/14/2023  Check In:  Session Check In - 07/14/23 1612       Check-In   Supervising physician immediately available to respond to emergencies See telemetry face sheet for immediately available ER MD    Location [facility]-Cardiac & Pulmonary Rehab    Staff Present Maxon Suzzette Righter, , Exercise Physiologist;Arnell Slivinski Jewel Baize, RN BSN;Joseph Reino Kent, [place]    Virtual Visit No    Medication changes reported     No    Fall or balance concerns reported    No    Warm-up and Cool-down Performed on first and last piece of equipment    Resistance Training Performed Yes    VAD Patient? No    PAD/SET Patient? No      Pain Assessment   Currently in Pain? No/denies                Social History   Tobacco Use  Smoking Status Former   Current packs/day: 0.00   Types: Cigarettes   Quit date: 10/12/2012   Years since quitting: 10.7  Smokeless Tobacco Never    Goals Met:  Independence with exercise equipment Exercise tolerated well No report of concerns or symptoms today Strength training completed today  Goals Unmet:  Not Applicable  Comments: Pt able to follow exercise prescription today without complaint.  Will continue to monitor for progression.    Dr. Bethann Punches is Medical Director for [facility] Cardiac Rehabilitation.  Dr. Vida Rigger is Medical Director for [facility] Pulmonary Rehabilitation.

## 2023-07-14 NOTE — Progress Notes (Signed)
Cardiac Individual Treatment Plan  Patient Details  Name: Connor Haas. MRN: [id number] Date of Birth: July 10, 1956 Referring Provider:   Flowsheet Row Cardiac Rehab from 05/26/2023 in [facility] Cardiac and Pulmonary Rehab  Referring Provider End       Initial Encounter Date:  Flowsheet Row Cardiac Rehab from 05/26/2023 in [facility] Cardiac and Pulmonary Rehab  Date 05/26/23       Visit Diagnosis: NSTEMI (non-ST elevation myocardial infarction) [facility])  Patient's Home Medications on Admission:  Current Outpatient Medications:    ALPRAZolam (XANAX) 1 MG tablet, Take 0.5 mg by mouth 3 (three) times daily as needed for sleep. For anxiety, Disp: , Rfl:    aspirin EC 81 MG tablet, Take 1 tablet (81 mg total) by mouth daily. Swallow whole., Disp: 120 tablet, Rfl: 3   carvedilol (COREG) 3.125 MG tablet, Take 1 tablet (3.125 mg total) by mouth 2 (two) times daily with a meal., Disp: 60 tablet, Rfl: 11   empagliflozin (JARDIANCE) 10 MG TABS tablet, Take 1 tablet (10 mg total) by mouth daily., Disp: 90 tablet, Rfl: 3   loperamide (IMODIUM A-D) 2 MG tablet, Take 2 mg by mouth 4 (four) times daily as needed for diarrhea or loose stools., Disp: , Rfl:    losartan (COZAAR) 25 MG tablet, Take 0.5 tablets (12.5 mg total) by mouth daily., Disp: 45 tablet, Rfl: 3   nitroGLYCERIN (NITROSTAT) 0.4 MG SL tablet, Place 1 tablet (0.4 mg total) under the tongue every 5 (five) minutes x 3 doses as needed for chest pain., Disp: 25 tablet, Rfl: 1   rosuvastatin (CRESTOR) 40 MG tablet, Take 1 tablet (40 mg total) by mouth daily., Disp: 90 tablet, Rfl: 3   ticagrelor (BRILINTA) 90 MG TABS tablet, Take 1 tablet (90 mg total) by mouth 2 (two) times daily., Disp: 60 tablet, Rfl: 11  Past Medical History: Past Medical History:  Diagnosis Date   Acute appendicitis 10/05/2013   Lap Appy 10/05/13    Anxiety    Chronic pain    Degenerative joint disease    Depression    GERD (gastroesophageal reflux disease)     Hyperlipidemia    Hypertension    Pneumonia     Tobacco Use: Social History   Tobacco Use  Smoking Status Former   Current packs/day: 0.00   Types: Cigarettes   Quit date: 10/12/2012   Years since quitting: 10.7  Smokeless Tobacco Never    Labs: Review Flowsheet  More data may exist      Latest Ref Rng & Units 07/14/2007 06/26/2010 12/28/2012 05/02/2023 06/10/2023  Labs for ITP Cardiac and Pulmonary Rehab  Cholestrol 0 - 200 mg/dL 782  956  - 213  97   LDL (calc) 0 - 99 mg/dL - - - 46  32   Direct LDL mg/dL 086.5  784.6  - - -  HDL-C >40 mg/dL 96.2  95.28  - 48  41   Trlycerides <150 mg/dL 413  244.0  - 102  725   Hemoglobin A1c 4.8 - 5.6 % - - - 6.2  -  TCO2 0 - 100 mmol/L - - 26  - -    Details             Exercise Target Goals: Exercise Program Goal: Individual exercise prescription set using results from initial 6 min walk test and THRR while considering  patient's activity barriers and safety.   Exercise Prescription Goal: Initial exercise prescription builds to 30-45 minutes a day of aerobic  activity, 2-3 days per week.  Home exercise guidelines will be given to patient during program as part of exercise prescription that the participant will acknowledge.   Education: Aerobic Exercise: - Group verbal and visual presentation on the components of exercise prescription. Introduces F.I.T.T principle from ACSM for exercise prescriptions.  Reviews F.I.T.T. principles of aerobic exercise including progression. Written material given at graduation. Flowsheet Row Cardiac Rehab from 07/07/2023 in [facility] Cardiac and Pulmonary Rehab  Education need identified 05/26/23       Education: Resistance Exercise: - Group verbal and visual presentation on the components of exercise prescription. Introduces F.I.T.T principle from ACSM for exercise prescriptions  Reviews F.I.T.T. principles of resistance exercise including progression. Written material given at graduation. Flowsheet Row  Cardiac Rehab from 07/07/2023 in [facility] Cardiac and Pulmonary Rehab  Date 06/02/23  Educator [facility]  Instruction Review Code 1- [employer] Understanding        Education: Exercise & Equipment Safety: - Individual verbal instruction and demonstration of equipment use and safety with use of the equipment. Flowsheet Row Cardiac Rehab from 07/07/2023 in [facility] Cardiac and Pulmonary Rehab  Date 05/26/23  Educator [facility]  Instruction Review Code 1- Verbalizes Understanding       Education: Exercise Physiology & General Exercise Guidelines: - Group verbal and written instruction with models to review the exercise physiology of the cardiovascular system and associated critical values. Provides general exercise guidelines with specific guidelines to those with heart or lung disease.  Flowsheet Row Cardiac Rehab from 07/07/2023 in [facility] Cardiac and Pulmonary Rehab  Education need identified 05/26/23  Date 06/02/23  Educator [facility]  Instruction Review Code 1- [employer] Understanding       Education: Flexibility, Balance, Mind/Body Relaxation: - Group verbal and visual presentation with interactive activity on the components of exercise prescription. Introduces F.I.T.T principle from ACSM for exercise prescriptions. Reviews F.I.T.T. principles of flexibility and balance exercise training including progression. Also discusses the mind body connection.  Reviews various relaxation techniques to help reduce and manage stress (i.e. Deep breathing, progressive muscle relaxation, and visualization). Balance handout provided to take home. Written material given at graduation.   Activity Barriers & Risk Stratification:  Activity Barriers & Cardiac Risk Stratification - 05/26/23 1537       Activity Barriers & Cardiac Risk Stratification   Activity Barriers None    Cardiac Risk Stratification High             6 Minute Walk:  6 Minute Walk     Row Name 05/26/23 1535         6 Minute Walk   Phase Initial      Distance 1340 feet     Walk Time 6 minutes     # of Rest Breaks 0     MPH 2.54     METS 2.97     RPE 11     Perceived Dyspnea  0     VO2 Peak 10.4     Symptoms No     Resting HR 59 bpm     Resting BP 102/58     Resting Oxygen Saturation  99 %     Exercise Oxygen Saturation  during 6 min walk 100 %     Max Ex. HR 92 bpm     Max Ex. BP 122/54     2 Minute Post BP 102/58              Oxygen Initial Assessment:   Oxygen Re-Evaluation:   Oxygen Discharge (  Final Oxygen Re-Evaluation):   Initial Exercise Prescription:  Initial Exercise Prescription - 05/26/23 1500       Date of Initial Exercise RX and Referring Provider   Date 05/26/23    Referring Provider End      Oxygen   Maintain Oxygen Saturation 88% or higher      Treadmill   MPH 2.5    Grade 0.5    Minutes 15    METs 3.09      NuStep   Level 3    SPM 80    Minutes 15    METs 2.97      Biostep-RELP   Level 3    SPM 50    Minutes 15    METs 2.97      Prescription Details   Frequency (times per week) 3    Duration Progress to 30 minutes of continuous aerobic without signs/symptoms of physical distress      Intensity   THRR 40-80% of Max Heartrate 96-134    Ratings of Perceived Exertion 11-15    Perceived Dyspnea 0-4      Progression   Progression Continue to progress workloads to maintain intensity without signs/symptoms of physical distress.      Resistance Training   Training Prescription Yes    Weight 4lb    Reps 10-15             Perform Capillary Blood Glucose checks as needed.  Exercise Prescription Changes:   Exercise Prescription Changes     Row Name 05/26/23 1500 06/17/23 1600 06/29/23 1500         Response to Exercise   Blood Pressure (Admit) 102/58 108/60 100/62     Blood Pressure (Exercise) 122/54 142/64 144/62     Blood Pressure (Exit) 102/58 106/58 118/64     Heart Rate (Admit) 59 bpm 74 bpm 65 bpm     Heart Rate (Exercise) 92 bpm 126 bpm 119 bpm      Heart Rate (Exit) 74 bpm 90 bpm 71 bpm     Oxygen Saturation (Admit) 99 % -- --     Oxygen Saturation (Exercise) 100 % -- --     Oxygen Saturation (Exit) 100 % -- --     Rating of Perceived Exertion (Exercise) 11 14 13      Perceived Dyspnea (Exercise) 0 -- --     Symptoms none -- none     Comments results First two weeks of exercise --     Duration -- Continue with 30 min of aerobic exercise without signs/symptoms of physical distress. Continue with 30 min of aerobic exercise without signs/symptoms of physical distress.     Intensity -- THRR unchanged THRR unchanged       Progression   Progression Continue to progress workloads to maintain intensity without signs/symptoms of physical distress. Continue to progress workloads to maintain intensity without signs/symptoms of physical distress. Continue to progress workloads to maintain intensity without signs/symptoms of physical distress.     Average METs -- 3.26 3.56       Resistance Training   Training Prescription -- Yes Yes     Weight -- 3 lb 3 lb     Reps -- 10-15 10-15       Interval Training   Interval Training -- No No       Treadmill   MPH -- 3.1 3.1     Grade -- 1 1.5     Minutes -- 15 15  METs -- 3.8 4.01       NuStep   Level -- 6 --     Minutes -- 15 --     METs -- 3.9 --       REL-XR   Level -- -- 3     Minutes -- -- 15     METs -- -- 3.3       T5 Nustep   Level -- 5  T6 Nustep --     Minutes -- 15 --     METs -- 3 --       Biostep-RELP   Level -- 2 3     Minutes -- 15 15     METs -- 3.53 3       Oxygen   Maintain Oxygen Saturation -- 88% or higher 88% or higher              Exercise Comments:   Exercise Comments     Row Name 05/31/23 1542           Exercise Comments First full day of exercise!  Patient was oriented to gym and equipment including functions, settings, policies, and procedures.  Patient's individual exercise prescription and treatment plan were reviewed.  All starting  workloads were established based on the results of the 6 minute walk test done at initial orientation visit.  The plan for exercise progression was also introduced and progression will be customized based on patient's performance and goals.'                Exercise Goals and Review:   Exercise Goals     Row Name 05/26/23 1601             Exercise Goals   Increase Physical Activity Yes       Intervention Develop an individualized exercise prescription for aerobic and resistive training based on initial evaluation findings, risk stratification, comorbidities and participant's personal goals.;Provide advice, education, support and counseling about physical activity/exercise needs.       Expected Outcomes Short Term: Attend rehab on a regular basis to increase amount of physical activity.;Long Term: Add in home exercise to make exercise part of routine and to increase amount of physical activity.;Long Term: Exercising regularly at least 3-5 days a week.       Increase Strength and Stamina Yes       Expected Outcomes Short Term: Increase workloads from initial exercise prescription for resistance, speed, and METs.;Short Term: Perform resistance training exercises routinely during rehab and add in resistance training at home;Long Term: Improve cardiorespiratory fitness, muscular endurance and strength as measured by increased METs and functional capacity ( )       Able to understand and use rate of perceived exertion (RPE) scale Yes       Intervention Provide education and explanation on how to use RPE scale       Expected Outcomes Short Term: Able to use RPE daily in rehab to express subjective intensity level;Long Term:  Able to use RPE to guide intensity level when exercising independently       Able to understand and use Dyspnea scale Yes       Intervention Provide education and explanation on how to use Dyspnea scale       Expected Outcomes Short Term: Able to use Dyspnea scale daily  in rehab to express subjective sense of shortness of breath during exertion;Long Term: Able to use Dyspnea scale to guide intensity level when exercising independently  Knowledge and understanding of Target Heart Rate Range (THRR) Yes       Intervention Provide education and explanation of THRR including how the numbers were predicted and where they are located for reference       Expected Outcomes Short Term: Able to state/look up THRR;Short Term: Able to use daily as guideline for intensity in rehab;Long Term: Able to use THRR to govern intensity when exercising independently       Able to check pulse independently Yes       Intervention Provide education and demonstration on how to check pulse in carotid and radial arteries.;Review the importance of being able to check your own pulse for safety during independent exercise       Expected Outcomes Short Term: Able to explain why pulse checking is important during independent exercise;Long Term: Able to check pulse independently and accurately       Understanding of Exercise Prescription Yes       Intervention Provide education, explanation, and written materials on patient's individual exercise prescription       Expected Outcomes Short Term: Able to explain program exercise prescription;Long Term: Able to explain home exercise prescription to exercise independently                Exercise Goals Re-Evaluation :  Exercise Goals Re-Evaluation     Row Name 05/31/23 1542 06/17/23 1628 06/29/23 1520         Exercise Goal Re-Evaluation   Exercise Goals Review Increase Physical Activity;Able to understand and use rate of perceived exertion (RPE) scale;Knowledge and understanding of Target Heart Rate Range (THRR);Understanding of Exercise Prescription;Increase Strength and Stamina;Able to check pulse independently Increase Physical Activity;Increase Strength and Stamina;Understanding of Exercise Prescription Increase Physical Activity;Increase  Strength and Stamina;Understanding of Exercise Prescription     Comments Reviewed RPE  and dyspnea scale, THR and program prescription with pt today.  Pt voiced understanding and was given a copy of goals to take home. Markeise is off to a good start in the program. He was able to increase his treadmill workload up to a speed of 3.1 mph and an incline of 1%. He also improved to level 6 on the T4 nustep and level 5 on the T6 nustep. We will continue to monitor his progress in the program. Charan is doing well in rehab. He increased his treadmill workload by increasing his incline to 1.5% while maintaining a speed of 3.1 mph. He also improved to level 3 on the biostep and XR. We will continue to monitor his progress in the program.     Expected Outcomes Short: Use RPE daily to regulate intensity.  Long: Follow program prescription in THR. Short: Continue to progressively increase treadmill workload. Long: Continue exercise to improve strength and stamina. Short: Continue to progressively increase treadmill workload. Long: Continue exercise to improve strength and stamina.              Discharge Exercise Prescription (Final Exercise Prescription Changes):  Exercise Prescription Changes - 06/29/23 1500       Response to Exercise   Blood Pressure (Admit) 100/62    Blood Pressure (Exercise) 144/62    Blood Pressure (Exit) 118/64    Heart Rate (Admit) 65 bpm    Heart Rate (Exercise) 119 bpm    Heart Rate (Exit) 71 bpm    Rating of Perceived Exertion (Exercise) 13    Symptoms none    Duration Continue with 30 min of aerobic exercise without signs/symptoms of physical distress.  Intensity THRR unchanged      Progression   Progression Continue to progress workloads to maintain intensity without signs/symptoms of physical distress.    Average METs 3.56      Resistance Training   Training Prescription Yes    Weight 3 lb    Reps 10-15      Interval Training   Interval Training No       Treadmill   MPH 3.1    Grade 1.5    Minutes 15    METs 4.01      REL-XR   Level 3    Minutes 15    METs 3.3      Biostep-RELP   Level 3    Minutes 15    METs 3      Oxygen   Maintain Oxygen Saturation 88% or higher             Nutrition:  Target Goals: Understanding of nutrition guidelines, daily intake of sodium 1500mg , cholesterol 200mg , calories 30% from fat and 7% or less from saturated fats, daily to have 5 or more servings of fruits and vegetables.  Education: All About Nutrition: -Group instruction provided by verbal, written material, interactive activities, discussions, models, and posters to present general guidelines for heart healthy nutrition including fat, fiber, MyPlate, the role of sodium in heart healthy nutrition, utilization of the nutrition label, and utilization of this knowledge for meal planning. Follow up email sent as well. Written material given at graduation. Flowsheet Row Cardiac Rehab from 07/07/2023 in [facility] Cardiac and Pulmonary Rehab  Date 06/16/23  Educator JG  Instruction Review Code 1- Verbalizes Understanding       Biometrics:  Pre Biometrics - 05/26/23 1549       Pre Biometrics   Height 6' 0.5" (1.842 m)    Weight 217 lb 14.4 oz (98.8 kg)    Waist Circumference 43.4 inches    Hip Circumference 44.5 inches    Waist to Hip Ratio 0.98 %    BMI (Calculated) 29.13    Single Leg Stand 3.59 seconds              Nutrition Therapy Plan and Nutrition Goals:  Nutrition Therapy & Goals - 05/26/23 1633       Nutrition Therapy   Diet Carb controlled, cardiac, low Na    Protein (specify units) 90    Fiber 30 grams    Whole Grain Foods 3 servings    Saturated Fats 15 max. grams    Fruits and Vegetables 5 servings/day    Sodium 2 grams      Personal Nutrition Goals   Nutrition Goal Eat a protein at ever meal    Personal Goal #2 Watch sodium intake, read labels of food ate often    Personal Goal #3 Watch portions and include  veggies at larger meals    Comments Patient and wife present today, they have begun making changes likes choosing leaner meats, smaller portions, and watching sodium. He is drinking ~64oz of water daily. Some juice, cut out sweet tea. Spoke with them about sugary beverages and to work on reducing and cutting them out. Reviewed his A1C and carb sources, portions, and goal of 30-60g per meal. Went over [employer] handout, types of fats, sources, and how to read labels. Built out meals and snacks with foods he likes focusing on pairing a controlled amount of carbs with healthy protein and including veggies at larger meals.      Intervention  Plan   Intervention Prescribe, educate and counsel regarding individualized specific dietary modifications aiming towards targeted core components such as weight, hypertension, lipid management, diabetes, heart failure and other comorbidities.;Nutrition handout(s) given to patient.    Expected Outcomes Short Term Goal: Understand basic principles of dietary content, such as calories, fat, sodium, cholesterol and nutrients.;Short Term Goal: A plan has been developed with personal nutrition goals set during dietitian appointment.;Long Term Goal: Adherence to prescribed nutrition plan.             Nutrition Assessments:  MEDIFICTS Score Key: >=70 Need to make dietary changes  40-70 Heart Healthy Diet <= 40 Therapeutic Level Cholesterol Diet  Flowsheet Row Cardiac Rehab from 05/26/2023 in [facility] Cardiac and Pulmonary Rehab  Picture Your Plate Total Score on Admission 69      Picture Your Plate Scores: <65 Unhealthy dietary pattern with much room for improvement. 41-50 Dietary pattern unlikely to meet recommendations for good health and room for improvement. 51-60 More healthful dietary pattern, with some room for improvement.  >60 Healthy dietary pattern, although there may be some specific behaviors that could be improved.    Nutrition Goals  Re-Evaluation:  Nutrition Goals Re-Evaluation     Row Name 06/17/23 1614             Goals   Current Weight 216 lb (98 kg)       Nutrition Goal Eat less sodium       Comment Williamson states his cholesterol is down. He has been eating more protien and reading food labels.       Expected Outcome Short: keep eating a lower sodium diet. Long: adhere to a heart healthy diet.                Nutrition Goals Discharge (Final Nutrition Goals Re-Evaluation):  Nutrition Goals Re-Evaluation - 06/17/23 1614       Goals   Current Weight 216 lb (98 kg)    Nutrition Goal Eat less sodium    Comment Candelario states his cholesterol is down. He has been eating more protien and reading food labels.    Expected Outcome Short: keep eating a lower sodium diet. Long: adhere to a heart healthy diet.             Psychosocial: Target Goals: Acknowledge presence or absence of significant depression and/or stress, maximize coping skills, provide positive support system. Participant is able to verbalize types and ability to use techniques and skills needed for reducing stress and depression.   Education: Stress, Anxiety, and Depression - Group verbal and visual presentation to define topics covered.  Reviews how body is impacted by stress, anxiety, and depression.  Also discusses healthy ways to reduce stress and to treat/manage anxiety and depression.  Written material given at graduation.   Education: Sleep Hygiene -Provides group verbal and written instruction about how sleep can affect your health.  Define sleep hygiene, discuss sleep cycles and impact of sleep habits. Review good sleep hygiene tips.    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 Nothing stems his  anxiety and is there. He stresses about alot of little things and life situations. His wife is a good support system for it.      Family Dynamics   Good  Support System? Yes      Barriers   Psychosocial barriers to participate in program The patient should benefit from training in stress management and relaxation.;There are no identifiable barriers or psychosocial needs.      Screening Interventions   Interventions Encouraged to exercise;To provide support and resources with identified psychosocial needs;Provide feedback about the scores to participant    Expected Outcomes Short Term goal: Utilizing psychosocial counselor, staff and physician to assist with identification of specific Stressors or current issues interfering with healing process. Setting desired goal for each stressor or current issue identified.;Long Term Goal: Stressors or current issues are controlled or eliminated.;Short Term goal: Identification and review with participant of any Quality of Life or Depression concerns found by scoring the questionnaire.;Long Term goal: The participant improves quality of Life and PHQ9 Scores as seen by post scores and/or verbalization of changes             Quality of Life Scores:   Quality of Life - 05/26/23 1551       Quality of Life   Select Quality of Life      Quality of Life Scores   Health/Function Pre 25.1 %    Socioeconomic Pre 20.13 %    Psych/Spiritual Pre 22.71 %    Family Pre 25.2 %    GLOBAL Pre 23.5 %            Scores of 19 and below usually indicate a poorer quality of life in these areas.  A difference of  2-3 points is a clinically meaningful difference.  A difference of 2-3 points in the total score of the Quality of Life Index has been associated with significant improvement in overall quality of life, self-image, physical symptoms, and general health in studies assessing change in quality of life.  PHQ-9: Review Flowsheet       05/26/2023  Depression  screen PHQ 2/9  Decreased Interest 0  Down, Depressed, Hopeless 1  PHQ - 2 Score 1  Altered sleeping 1  Tired, decreased energy 1  Change in appetite 0  Feeling bad or failure about yourself  0  Trouble concentrating 0  Moving slowly or fidgety/restless 1  Suicidal thoughts 0  PHQ-9 Score 4  Difficult doing work/chores Not difficult at all    Details           Interpretation of Total Score  Total Score Depression Severity:  1-4 = Minimal depression, 5-9 = Mild depression, 10-14 = Moderate depression, 15-19 = Moderately severe depression, 20-27 = Severe depression   Psychosocial Evaluation and Intervention:  Psychosocial Evaluation - 05/20/23 1556       Psychosocial Evaluation & Interventions   Interventions Encouraged to exercise with the program and follow exercise prescription;Relaxation education;Stress management education    Comments 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 Nothing stems his anxiety and is there. He stresses about alot of little things and life situations. His wife is a good support system for it.    Expected Outcomes Short: Start [facility] to help with mood. Long: Maintain a healthy mental state    Continue Psychosocial Services  Follow up required by staff             Psychosocial Re-Evaluation:  Psychosocial Re-Evaluation     Row Name 06/17/23 1619             Psychosocial Re-Evaluation   Current issues with Current Anxiety/Panic       Comments Patient reports  no issues with their current mental states, sleep, stress, depression or anxiety. Will follow up with patient in a few weeks for any changes.       Expected Outcomes Short: Continue to exercise regularly to support mental health and notify staff of any changes. Long: maintain mental health and well being through teaching of rehab or prescribed medications independently.       Interventions Encouraged to attend Cardiac Rehabilitation for the exercise       Continue  Psychosocial Services  Follow up required by staff                Psychosocial Discharge (Final Psychosocial Re-Evaluation):  Psychosocial Re-Evaluation - 06/17/23 1619       Psychosocial Re-Evaluation   Current issues with Current Anxiety/Panic    Comments Patient reports no issues with their current mental states, sleep, stress, depression or anxiety. Will follow up with patient in a few weeks for any changes.    Expected Outcomes Short: Continue to exercise regularly to support mental health and notify staff of any changes. Long: maintain mental health and well being through teaching of rehab or prescribed medications independently.    Interventions Encouraged to attend Cardiac Rehabilitation for the exercise    Continue Psychosocial Services  Follow up required by staff             Vocational Rehabilitation: Provide vocational rehab assistance to qualifying candidates.   Vocational Rehab Evaluation & Intervention:   Education: Education Goals: Education classes will be provided on a variety of topics geared toward better understanding of heart health and risk factor modification. Participant will state understanding/return demonstration of topics presented as noted by education test scores.  Learning Barriers/Preferences:  Learning Barriers/Preferences - 05/20/23 1554       Learning Barriers/Preferences   Learning Barriers None    Learning Preferences None             General Cardiac Education Topics:  AED/CPR: - Group verbal and written instruction with the use of models to demonstrate the basic use of the AED with the basic ABC's of resuscitation.   Anatomy and Cardiac Procedures: - Group verbal and visual presentation and models provide information about basic cardiac anatomy and function. Reviews the testing methods done to diagnose heart disease and the outcomes of the test results. Describes the treatment choices: Medical Management, Angioplasty, or  Coronary Bypass Surgery for treating various heart conditions including Myocardial Infarction, Angina, Valve Disease, and Cardiac Arrhythmias.  Written material given at graduation.   Medication Safety: - Group verbal and visual instruction to review commonly prescribed medications for heart and lung disease. Reviews the medication, class of the drug, and side effects. Includes the steps to properly store meds and maintain the prescription regimen.  Written material given at graduation.   Intimacy: - Group verbal instruction through game format to discuss how heart and lung disease can affect sexual intimacy. Written material given at graduation..   Know Your Numbers and Heart Failure: - Group verbal and visual instruction to discuss disease risk factors for cardiac and pulmonary disease and treatment options.  Reviews associated critical values for Overweight/Obesity, Hypertension, Cholesterol, and Diabetes.  Discusses basics of heart failure: signs/symptoms and treatments.  Introduces Heart Failure Zone chart for action plan for heart failure.  Written material given at graduation.   Infection Prevention: - Provides verbal and written material to individual with discussion of infection control including proper hand washing and proper equipment cleaning during exercise session. Flowsheet  Row Cardiac Rehab from 07/07/2023 in [facility] Cardiac and Pulmonary Rehab  Date 05/26/23  Educator [facility]  Instruction Review Code 1- Verbalizes Understanding       Falls Prevention: - Provides verbal and written material to individual with discussion of falls prevention and safety. Flowsheet Row Cardiac Rehab from 07/07/2023 in [facility] Cardiac and Pulmonary Rehab  Date 05/26/23  Educator [facility]  Instruction Review Code 1- Verbalizes Understanding       Other: -Provides group and verbal instruction on various topics (see comments)   Knowledge Questionnaire Score:  Knowledge Questionnaire Score - 05/26/23 1556        Knowledge Questionnaire Score   Pre Score 25/26             Core Components/Risk Factors/Patient Goals at Admission:  Personal Goals and Risk Factors at Admission - 05/26/23 1559       Core Components/Risk Factors/Patient Goals on Admission    Weight Management Yes;Weight Loss    Intervention Weight Management: Develop a combined nutrition and exercise program designed to reach desired caloric intake, while maintaining appropriate intake of nutrient and fiber, sodium and fats, and appropriate energy expenditure required for the weight goal.;Weight Management: Provide education and appropriate resources to help participant work on and attain dietary goals.;Weight Management/Obesity: Establish reasonable short term and long term weight goals.;Obesity: Provide education and appropriate resources to help participant work on and attain dietary goals.    Admit Weight 217 lb 14.4 oz (98.8 kg)    Goal Weight: Short Term 210 lb (95.3 kg)    Goal Weight: Long Term 200 lb (90.7 kg)    Expected Outcomes Short Term: Continue to assess and modify interventions until short term weight is achieved;Long Term: Adherence to nutrition and physical activity/exercise program aimed toward attainment of established weight goal;Weight Loss: Understanding of general recommendations for a balanced deficit meal plan, which promotes 1-2 lb weight loss per week and includes a negative energy balance of [phone] kcal/d;Understanding recommendations for meals to include 15-35% energy as protein, 25-35% energy from fat, 35-60% energy from carbohydrates, less than 200mg  of dietary cholesterol, 20-35 gm of total fiber daily;Understanding of distribution of calorie intake throughout the day with the consumption of 4-5 meals/snacks    Heart Failure Yes    Intervention Provide a combined exercise and nutrition program that is supplemented with education, support and counseling about heart failure. Directed toward relieving  symptoms such as shortness of breath, decreased exercise tolerance, and extremity edema.    Expected Outcomes Improve functional capacity of life;Short term: Attendance in program 2-3 days a week with increased exercise capacity. Reported lower sodium intake. Reported increased fruit and vegetable intake. Reports medication compliance.;Short term: Daily weights obtained and reported for increase. Utilizing diuretic protocols set by physician.;Long term: Adoption of self-care skills and reduction of barriers for early signs and symptoms recognition and intervention leading to self-care maintenance.    Hypertension Yes    Intervention Provide education on lifestyle modifcations including regular physical activity/exercise, weight management, moderate sodium restriction and increased consumption of fresh fruit, vegetables, and low fat dairy, alcohol moderation, and smoking cessation.;Monitor prescription use compliance.    Expected Outcomes Short Term: Continued assessment and intervention until BP is < 140/3mm HG in hypertensive participants. < 130/34mm HG in hypertensive participants with diabetes, heart failure or chronic kidney disease.;Long Term: Maintenance of blood pressure at goal levels.    Lipids Yes    Intervention Provide education and support for participant on nutrition & aerobic/resistive exercise along with  prescribed medications to achieve LDL 70mg , HDL >40mg .    Expected Outcomes Short Term: Participant states understanding of desired cholesterol values and is compliant with medications prescribed. Participant is following exercise prescription and nutrition guidelines.;Long Term: Cholesterol controlled with medications as prescribed, with individualized exercise RX and with personalized nutrition plan. Value goals: LDL < 70mg , HDL > 40 mg.             Education:Diabetes - Individual verbal and written instruction to review signs/symptoms of diabetes, desired ranges of glucose level  fasting, after meals and with exercise. Acknowledge that pre and post exercise glucose checks will be done for 3 sessions at entry of program.   Core Components/Risk Factors/Patient Goals Review:   Goals and Risk Factor Review     Row Name 06/17/23 1616             Core Components/Risk Factors/Patient Goals Review   Personal Goals Review Weight Management/Obesity       Review Leilan wants to lose some weight. He has made some diet changes and is focusing on eating more fruit and vegetables. He has also cut back on the sweets.       Expected Outcomes Short: lose a few pounds in the next couple weeks. Long: reach weight goal of 200.                Core Components/Risk Factors/Patient Goals at Discharge (Final Review):   Goals and Risk Factor Review - 06/17/23 1616       Core Components/Risk Factors/Patient Goals Review   Personal Goals Review Weight Management/Obesity    Review Vittorio wants to lose some weight. He has made some diet changes and is focusing on eating more fruit and vegetables. He has also cut back on the sweets.    Expected Outcomes Short: lose a few pounds in the next couple weeks. Long: reach weight goal of 200.             ITP Comments:  ITP Comments     Row Name 05/20/23 1553 05/26/23 1555 05/31/23 1542 06/16/23 0934 07/14/23 1000   ITP Comments Virtual Visit completed. Patient informed on EP and RD appointment and 6 Minute walk test. Patient also informed of patient health questionnaires on My Chart. Patient Verbalizes understanding. Visit diagnosis can be found in [facility] 05/08/2023. Completed and gym orientation. Initial ITP created and sent for review to Dr. Bethann Punches, Medical Director. First full day of exercise!  Patient was oriented to gym and equipment including functions, settings, policies, and procedures.  Patient's individual exercise prescription and treatment plan were reviewed.  All starting workloads were established based on the results  of the 6 minute walk test done at initial orientation visit.  The plan for exercise progression was also introduced and progression will be customized based on patient's performance and goals.' 30 Day review completed. Medical Director ITP review done, changes made as directed, and signed approval by Medical Director.     new to program 30 Day review completed. Medical Director ITP review done, changes made as directed, and signed approval by Medical Director.            Comments:

## 2023-07-14 NOTE — Telephone Encounter (Signed)
Pt stopped by office and picked up forms.

## 2023-07-15 ENCOUNTER — Encounter: Payer: Medicare Other | Admitting: *Deleted

## 2023-07-15 DIAGNOSIS — I214 Non-ST elevation (NSTEMI) myocardial infarction: Secondary | ICD-10-CM | POA: Diagnosis not present

## 2023-07-15 DIAGNOSIS — Z955 Presence of coronary angioplasty implant and graft: Secondary | ICD-10-CM

## 2023-07-15 NOTE — Progress Notes (Signed)
Daily Session Note  Patient Details  Name: Connor Haas. MRN: [id number] Date of Birth: 09-Feb-1956 Referring Provider:   Flowsheet Row Cardiac Rehab from 05/26/2023 in [facility] Cardiac and Pulmonary Rehab  Referring Provider End       Encounter Date: 07/15/2023  Check In:  Session Check In - 07/15/23 1605       Check-In   Supervising physician immediately available to respond to emergencies See telemetry face sheet for immediately available ER MD    Location [facility]-Cardiac & Pulmonary Rehab    Staff Present Maxon Suzzette Righter, , Exercise Physiologist;Lynley Killilea Jewel Baize, RN BSN;Joseph Reino Kent, RCP,RRT,BSRT    Virtual Visit No    Medication changes reported     No    Fall or balance concerns reported    No    Warm-up and Cool-down Performed on first and last piece of equipment    Resistance Training Performed Yes    VAD Patient? No    PAD/SET Patient? No      Pain Assessment   Currently in Pain? No/denies                Social History   Tobacco Use  Smoking Status Former   Current packs/day: 0.00   Types: Cigarettes   Quit date: 10/12/2012   Years since quitting: 10.7  Smokeless Tobacco Never    Goals Met:  Independence with exercise equipment Exercise tolerated well No report of concerns or symptoms today Strength training completed today  Goals Unmet:  Not Applicable  Comments: Pt able to follow exercise prescription today without complaint.  Will continue to monitor for progression.    Dr. Bethann Punches is Medical Director for [facility] Cardiac Rehabilitation.  Dr. Vida Rigger is Medical Director for [facility] Pulmonary Rehabilitation.

## 2023-07-19 ENCOUNTER — Encounter: Payer: Medicare Other | Admitting: *Deleted

## 2023-07-19 DIAGNOSIS — I214 Non-ST elevation (NSTEMI) myocardial infarction: Secondary | ICD-10-CM

## 2023-07-19 DIAGNOSIS — Z955 Presence of coronary angioplasty implant and graft: Secondary | ICD-10-CM

## 2023-07-19 NOTE — Progress Notes (Signed)
Daily Session Note  Patient Details  Name: Connor Haas. MRN: [id number] Date of Birth: 02/06/1956 Referring Provider:   Flowsheet Row Cardiac Rehab from 05/26/2023 in [facility] Cardiac and Pulmonary Rehab  Referring Provider End       Encounter Date: 07/19/2023  Check In:  Session Check In - 07/19/23 1531       Check-In   Supervising physician immediately available to respond to emergencies See telemetry face sheet for immediately available ER MD    Location [facility]-Cardiac & Pulmonary Rehab    Staff Present Maxon Suzzette Righter, , Exercise Physiologist;Chelsa Stout Jewel Baize, RN BSN;Joseph Shelbie Proctor, RN, [place]    Virtual Visit No    Medication changes reported     No    Fall or balance concerns reported    No    Warm-up and Cool-down Performed on first and last piece of equipment    Resistance Training Performed Yes    VAD Patient? No    PAD/SET Patient? No      Pain Assessment   Currently in Pain? No/denies                Social History   Tobacco Use  Smoking Status Former   Current packs/day: 0.00   Types: Cigarettes   Quit date: 10/12/2012   Years since quitting: 10.7  Smokeless Tobacco Never    Goals Met:  Independence with exercise equipment Exercise tolerated well No report of concerns or symptoms today Strength training completed today  Goals Unmet:  Not Applicable  Comments: Pt able to follow exercise prescription today without complaint.  Will continue to monitor for progression.    Dr. Bethann Punches is Medical Director for [facility] Cardiac Rehabilitation.  Dr. Vida Rigger is Medical Director for [facility] Pulmonary Rehabilitation.

## 2023-07-21 ENCOUNTER — Encounter: Payer: Medicare Other | Admitting: *Deleted

## 2023-07-21 DIAGNOSIS — Z955 Presence of coronary angioplasty implant and graft: Secondary | ICD-10-CM

## 2023-07-21 DIAGNOSIS — I214 Non-ST elevation (NSTEMI) myocardial infarction: Secondary | ICD-10-CM

## 2023-07-21 NOTE — Progress Notes (Signed)
Daily Session Note  Patient Details  Name: Connor Haas. MRN: [id number] Date of Birth: [birth year] Referring Provider:   Flowsheet Row Cardiac Rehab from 05/26/2023 in [facility] Cardiac and Pulmonary Rehab  Referring Provider End       Encounter Date: 07/21/2023  Check In:  Session Check In - 07/21/23 1536       Check-In   Supervising physician immediately available to respond to emergencies See telemetry face sheet for immediately available ER MD    Location [facility]-Cardiac & Pulmonary Rehab    Staff Present Maxon Suzzette Righter, , Exercise Physiologist;Kannen Moxey Jewel Baize, RN BSN;Joseph Hood, RCP,RRT,BSRT;Kelly Kaaawa, BS, ACSM CEP, Exercise Physiologist    Virtual Visit No    Medication changes reported     No    Fall or balance concerns reported    No    Warm-up and Cool-down Performed on first and last piece of equipment    Resistance Training Performed Yes    VAD Patient? No    PAD/SET Patient? No      Pain Assessment   Currently in Pain? No/denies                Social History   Tobacco Use  Smoking Status Former   Current packs/day: 0.00   Types: Cigarettes   Quit date: 10/12/2012   Years since quitting: 10.7  Smokeless Tobacco Never    Goals Met:  Independence with exercise equipment Exercise tolerated well No report of concerns or symptoms today Strength training completed today  Goals Unmet:  Not Applicable  Comments: Pt able to follow exercise prescription today without complaint.  Will continue to monitor for progression.  Reviewed home exercise with pt today.  Pt plans to walk, use weights and look into a silver sneakers membership for exercise.  Reviewed THR, pulse, RPE, sign and symptoms, pulse oximetery and when to call 911 or MD.  Also discussed weather considerations and indoor options.  Pt voiced understanding.     Dr. Bethann Punches is Medical Director for [facility] Cardiac Rehabilitation.  Dr. Vida Rigger is Medical Director for [facility]  Pulmonary Rehabilitation.

## 2023-07-22 ENCOUNTER — Encounter: Payer: Medicare Other | Admitting: *Deleted

## 2023-07-22 DIAGNOSIS — Z955 Presence of coronary angioplasty implant and graft: Secondary | ICD-10-CM

## 2023-07-22 DIAGNOSIS — I214 Non-ST elevation (NSTEMI) myocardial infarction: Secondary | ICD-10-CM | POA: Diagnosis not present

## 2023-07-22 NOTE — Progress Notes (Signed)
Daily Session Note  Patient Details  Name: Connor Haas. MRN: [id number] Date of Birth: Sep 19, 1956 Referring Provider:   Flowsheet Row Cardiac Rehab from 05/26/2023 in [facility] Cardiac and Pulmonary Rehab  Referring Provider End       Encounter Date: 07/22/2023  Check In:  Session Check In - 07/22/23 1535       Check-In   Supervising physician immediately available to respond to emergencies See telemetry face sheet for immediately available ER MD    Location [facility]-Cardiac & Pulmonary Rehab    Staff Present Maxon Suzzette Righter, , Exercise Physiologist;Lynda Wanninger Jewel Baize, RN BSN;Joseph Hood, RCP,RRT,BSRT;Noah Tickle, BS, Exercise Physiologist    Virtual Visit No    Medication changes reported     No    Fall or balance concerns reported    No    Warm-up and Cool-down Performed on first and last piece of equipment    Resistance Training Performed Yes    VAD Patient? No    PAD/SET Patient? No      Pain Assessment   Currently in Pain? No/denies                Social History   Tobacco Use  Smoking Status Former   Current packs/day: 0.00   Types: Cigarettes   Quit date: 10/12/2012   Years since quitting: 10.7  Smokeless Tobacco Never    Goals Met:  Independence with exercise equipment Exercise tolerated well No report of concerns or symptoms today Strength training completed today  Goals Unmet:  Not Applicable  Comments: Pt able to follow exercise prescription today without complaint.  Will continue to monitor for progression.    Dr. Bethann Punches is Medical Director for [facility] Cardiac Rehabilitation.  Dr. Vida Rigger is Medical Director for [facility] Pulmonary Rehabilitation.

## 2023-07-26 ENCOUNTER — Encounter: Payer: Medicare Other | Admitting: *Deleted

## 2023-07-26 DIAGNOSIS — I214 Non-ST elevation (NSTEMI) myocardial infarction: Secondary | ICD-10-CM

## 2023-07-26 DIAGNOSIS — Z955 Presence of coronary angioplasty implant and graft: Secondary | ICD-10-CM

## 2023-07-26 NOTE — Progress Notes (Signed)
Daily Session Note  Patient Details  Name: Connor Haas. MRN: [id number] Date of Birth: Feb 14, 1956 Referring Provider:   Flowsheet Row Cardiac Rehab from 05/26/2023 in [facility] Cardiac and Pulmonary Rehab  Referring Provider End       Encounter Date: 07/26/2023  Check In:  Session Check In - 07/26/23 1530       Check-In   Supervising physician immediately available to respond to emergencies See telemetry face sheet for immediately available ER MD    Location [facility]-Cardiac & Pulmonary Rehab    Staff Present Maxon Suzzette Righter, , Exercise Physiologist;Kasen Adduci Jewel Baize, RN BSN;Joseph Shelbie Proctor, RN, [place]    Virtual Visit No    Medication changes reported     No    Fall or balance concerns reported    No    Warm-up and Cool-down Performed on first and last piece of equipment    Resistance Training Performed Yes    VAD Patient? No    PAD/SET Patient? No      Pain Assessment   Currently in Pain? No/denies                Social History   Tobacco Use  Smoking Status Former   Current packs/day: 0.00   Types: Cigarettes   Quit date: 10/12/2012   Years since quitting: 10.7  Smokeless Tobacco Never    Goals Met:  Independence with exercise equipment Exercise tolerated well No report of concerns or symptoms today Strength training completed today  Goals Unmet:  Not Applicable  Comments: Pt able to follow exercise prescription today without complaint.  Will continue to monitor for progression.    Dr. Bethann Punches is Medical Director for [facility] Cardiac Rehabilitation.  Dr. Vida Rigger is Medical Director for [facility] Pulmonary Rehabilitation.

## 2023-07-28 ENCOUNTER — Encounter: Payer: Medicare Other | Admitting: *Deleted

## 2023-07-28 DIAGNOSIS — I214 Non-ST elevation (NSTEMI) myocardial infarction: Secondary | ICD-10-CM

## 2023-07-28 DIAGNOSIS — Z955 Presence of coronary angioplasty implant and graft: Secondary | ICD-10-CM

## 2023-07-28 NOTE — Progress Notes (Signed)
Daily Session Note  Patient Details  Name: Connor Haas. MRN: [id number] Date of Birth: 1956/07/30 Referring Provider:   Flowsheet Row Cardiac Rehab from 05/26/2023 in [facility] Cardiac and Pulmonary Rehab  Referring Provider End       Encounter Date: 07/28/2023  Check In:  Session Check In - 07/28/23 1541       Check-In   Supervising physician immediately available to respond to emergencies See telemetry face sheet for immediately available ER MD    Location [facility]-Cardiac & Pulmonary Rehab    Staff Present Tommye Standard, BS, ACSM CEP, Exercise Physiologist;Harshith Pursell Jewel Baize, RN BSN;Joseph Sheridan, [place];Cora Collum, RN, BSN, CCRP    Virtual Visit No    Medication changes reported     No    Fall or balance concerns reported    No    Warm-up and Cool-down Performed on first and last piece of equipment    Resistance Training Performed Yes    VAD Patient? No    PAD/SET Patient? No      Pain Assessment   Currently in Pain? No/denies                Social History   Tobacco Use  Smoking Status Former   Current packs/day: 0.00   Types: Cigarettes   Quit date: 10/12/2012   Years since quitting: 10.7  Smokeless Tobacco Never    Goals Met:  Independence with exercise equipment Exercise tolerated well No report of concerns or symptoms today Strength training completed today  Goals Unmet:  Not Applicable  Comments: Pt able to follow exercise prescription today without complaint.  Will continue to monitor for progression. '   Dr. Bethann Punches is Medical Director for [facility] Cardiac Rehabilitation.  Dr. Vida Rigger is Medical Director for [facility] Pulmonary Rehabilitation.

## 2023-07-29 ENCOUNTER — Encounter: Payer: Medicare Other | Admitting: *Deleted

## 2023-07-29 DIAGNOSIS — I214 Non-ST elevation (NSTEMI) myocardial infarction: Secondary | ICD-10-CM | POA: Diagnosis not present

## 2023-07-29 NOTE — Progress Notes (Signed)
Daily Session Note  Patient Details  Name: Connor Haas. MRN: [id number] Date of Birth: 1956-03-19 Referring Provider:   Flowsheet Row Cardiac Rehab from 05/26/2023 in [facility] Cardiac and Pulmonary Rehab  Referring Provider End       Encounter Date: 07/29/2023  Check In:  Session Check In - 07/29/23 1525       Check-In   Supervising physician immediately available to respond to emergencies See telemetry face sheet for immediately available ER MD    Location [facility]-Cardiac & Pulmonary Rehab    Staff Present Susann Givens, RN BSN;Joseph Reino Kent, RCP,RRT,BSRT;Kazuma Elena Katrinka Blazing, RN, [place]    Virtual Visit No    Medication changes reported     No    Fall or balance concerns reported    No    Warm-up and Cool-down Performed on first and last piece of equipment    Resistance Training Performed Yes    VAD Patient? No    PAD/SET Patient? No      Pain Assessment   Currently in Pain? No/denies                Social History   Tobacco Use  Smoking Status Former   Current packs/day: 0.00   Types: Cigarettes   Quit date: 10/12/2012   Years since quitting: 10.8  Smokeless Tobacco Never    Goals Met:  Independence with exercise equipment Exercise tolerated well No report of concerns or symptoms today Strength training completed today  Goals Unmet:  Not Applicable  Comments: Pt able to follow exercise prescription today without complaint.  Will continue to monitor for progression.    Dr. Bethann Punches is Medical Director for [facility] Cardiac Rehabilitation.  Dr. Vida Rigger is Medical Director for [facility] Pulmonary Rehabilitation.

## 2023-08-02 ENCOUNTER — Encounter: Payer: Medicare Other | Admitting: *Deleted

## 2023-08-02 DIAGNOSIS — I214 Non-ST elevation (NSTEMI) myocardial infarction: Secondary | ICD-10-CM | POA: Diagnosis not present

## 2023-08-02 DIAGNOSIS — Z955 Presence of coronary angioplasty implant and graft: Secondary | ICD-10-CM

## 2023-08-02 NOTE — Progress Notes (Signed)
Daily Session Note  Patient Details  Name: Connor Haas. MRN: [id number] Date of Birth: 16-Oct-1955 Referring Provider:   Flowsheet Row Cardiac Rehab from 05/26/2023 in [facility] Cardiac and Pulmonary Rehab  Referring Provider End       Encounter Date: 08/02/2023  Check In:  Session Check In - 08/02/23 1524       Check-In   Supervising physician immediately available to respond to emergencies See telemetry face sheet for immediately available ER MD    Location [facility]-Cardiac & Pulmonary Rehab    Staff Present Maxon Suzzette Righter, , Exercise Physiologist;Annaelle Kasel Jewel Baize, RN BSN;Joseph Reino Kent, RCP,RRT,BSRT    Virtual Visit No    Medication changes reported     No    Fall or balance concerns reported    No    Warm-up and Cool-down Performed on first and last piece of equipment    Resistance Training Performed Yes    VAD Patient? No    PAD/SET Patient? No      Pain Assessment   Currently in Pain? No/denies                Social History   Tobacco Use  Smoking Status Former   Current packs/day: 0.00   Types: Cigarettes   Quit date: 10/12/2012   Years since quitting: 10.8  Smokeless Tobacco Never    Goals Met:  Independence with exercise equipment Exercise tolerated well No report of concerns or symptoms today Strength training completed today  Goals Unmet:  Not Applicable  Comments: Pt able to follow exercise prescription today without complaint.  Will continue to monitor for progression.    Dr. Bethann Punches is Medical Director for [facility] Cardiac Rehabilitation.  Dr. Vida Rigger is Medical Director for [facility] Pulmonary Rehabilitation.

## 2023-08-04 ENCOUNTER — Encounter: Payer: Medicare Other | Admitting: *Deleted

## 2023-08-04 DIAGNOSIS — I214 Non-ST elevation (NSTEMI) myocardial infarction: Secondary | ICD-10-CM

## 2023-08-04 DIAGNOSIS — Z955 Presence of coronary angioplasty implant and graft: Secondary | ICD-10-CM

## 2023-08-04 NOTE — Progress Notes (Signed)
Daily Session Note  Patient Details  Name: Connor Haas. MRN: [id number] Date of Birth: 04-Mar-1956 Referring Provider:   Flowsheet Row Cardiac Rehab from 05/26/2023 in [facility] Cardiac and Pulmonary Rehab  Referring Provider End       Encounter Date: 08/04/2023  Check In:  Session Check In - 08/04/23 1533       Check-In   Supervising physician immediately available to respond to emergencies See telemetry face sheet for immediately available ER MD    Location [facility]-Cardiac & Pulmonary Rehab    Staff Present Lanny Hurst, RN, ADN;Ryane Canavan Jewel Baize, RN BSN;Joseph Hood, RCP,RRT,BSRT;Cora Collum, RN, BSN, CCRP    Virtual Visit No    Medication changes reported     No    Fall or balance concerns reported    No    Warm-up and Cool-down Performed on first and last piece of equipment    Resistance Training Performed Yes    VAD Patient? No    PAD/SET Patient? No      Pain Assessment   Currently in Pain? No/denies                Social History   Tobacco Use  Smoking Status Former   Current packs/day: 0.00   Types: Cigarettes   Quit date: 10/12/2012   Years since quitting: 10.8  Smokeless Tobacco Never    Goals Met:  Independence with exercise equipment Exercise tolerated well No report of concerns or symptoms today Strength training completed today  Goals Unmet:  Not Applicable  Comments: Pt able to follow exercise prescription today without complaint.  Will continue to monitor for progression.    Dr. Bethann Punches is Medical Director for [facility] Cardiac Rehabilitation.  Dr. Vida Rigger is Medical Director for [facility] Pulmonary Rehabilitation.

## 2023-08-05 ENCOUNTER — Encounter: Payer: Medicare Other | Admitting: *Deleted

## 2023-08-05 VITALS — Ht 72.5 in | Wt 209.0 lb

## 2023-08-05 DIAGNOSIS — Z955 Presence of coronary angioplasty implant and graft: Secondary | ICD-10-CM

## 2023-08-05 DIAGNOSIS — I214 Non-ST elevation (NSTEMI) myocardial infarction: Secondary | ICD-10-CM

## 2023-08-05 NOTE — Patient Instructions (Signed)
Discharge Patient Instructions  Patient Details  Name: Connor Haas. MRN: [id number] Date of Birth: 1956/08/29 Referring Provider:  [facility]   Number of Visits: 36/36  Reason for Discharge:  Patient reached a stable level of exercise. Patient independent in their exercise. Patient has met program and personal goals. Diagnosis:  NSTEMI (non-ST elevation myocardial infarction) [facility])  Status post coronary artery stent placement  Initial Exercise Prescription:  Initial Exercise Prescription - 05/26/23 1500       Date of Initial Exercise RX and Referring Provider   Date 05/26/23    Referring Provider End      Oxygen   Maintain Oxygen Saturation 88% or higher      Treadmill   MPH 2.5    Grade 0.5    Minutes 15    METs 3.09      NuStep   Level 3    SPM 80    Minutes 15    METs 2.97      Biostep-RELP   Level 3    SPM 50    Minutes 15    METs 2.97      Prescription Details   Frequency (times per week) 3    Duration Progress to 30 minutes of continuous aerobic without signs/symptoms of physical distress      Intensity   THRR 40-80% of Max Heartrate 96-134    Ratings of Perceived Exertion 11-15    Perceived Dyspnea 0-4      Progression   Progression Continue to progress workloads to maintain intensity without signs/symptoms of physical distress.      Resistance Training   Training Prescription Yes    Weight 4lb    Reps 10-15             Discharge Exercise Prescription (Final Exercise Prescription Changes):  Exercise Prescription Changes - 07/29/23 1400       Response to Exercise   Blood Pressure (Admit) 98/62    Blood Pressure (Exit) 118/66    Heart Rate (Admit) 67 bpm    Heart Rate (Exercise) 134 bpm    Heart Rate (Exit) 89 bpm    Rating of Perceived Exertion (Exercise) 15    Perceived Dyspnea (Exercise) 0    Symptoms none    Duration Continue with 30 min of aerobic exercise without signs/symptoms of physical distress.     Intensity THRR unchanged      Progression   Progression Continue to progress workloads to maintain intensity without signs/symptoms of physical distress.    Average METs 4.6      Resistance Training   Training Prescription Yes    Weight 8 lb    Reps 10-15      Interval Training   Interval Training No      Treadmill   MPH 3.3    Grade 3    Minutes 15    METs 4.89      NuStep   Level 6    Minutes 15    METs 6.2      REL-XR   Level 8    Minutes 15    METs 4.5      T5 Nustep   Level 6   T6   Minutes 15    METs 4.1      Home Exercise Plan   Plans to continue exercise at Home (comment)   walk, weights, and look into a silver sneakers membership   Frequency Add 2 additional days to program exercise sessions.  Initial Home Exercises Provided 07/21/23      Oxygen   Maintain Oxygen Saturation 88% or higher             Functional Capacity:  6 Minute Walk     Row Name 05/26/23 1535 08/05/23 1544       6 Minute Walk   Phase Initial Discharge    Distance 1340 feet 1500 feet    Distance % Change -- 12 %    Distance Feet Change -- 160 ft    Walk Time 6 minutes 6 minutes    # of Rest Breaks 0 0    MPH 2.54 2.84    METS 2.97 3.6    RPE 11 11    Perceived Dyspnea  0 0    VO2 Peak 10.4 12.61    Symptoms No No    Resting HR 59 bpm 64 bpm    Resting BP 102/58 100/52    Resting Oxygen Saturation  99 % 96 %    Exercise Oxygen Saturation  during 6 min walk 100 % 96 %    Max Ex. HR 92 bpm 95 bpm    Max Ex. BP 122/54 158/70    2 Minute Post BP 102/58 --            Nutrition & Weight - Outcomes:  Pre Biometrics - 05/26/23 1549       Pre Biometrics   Height 6' 0.5" (1.842 m)    Weight 217 lb 14.4 oz (98.8 kg)    Waist Circumference 43.4 inches    Hip Circumference 44.5 inches    Waist to Hip Ratio 0.98 %    BMI (Calculated) 29.13    Single Leg Stand 3.59 seconds             Post Biometrics - 08/05/23 1552        Post  Biometrics   Height 6'  0.5" (1.842 m)    Weight 209 lb (94.8 kg)    Waist Circumference 41 inches    Hip Circumference 43 inches    Waist to Hip Ratio 0.95 %    BMI (Calculated) 27.94    Single Leg Stand 30 seconds             Nutrition:  Nutrition Therapy & Goals - 05/26/23 1633       Nutrition Therapy   Diet Carb controlled, cardiac, low Na    Protein (specify units) 90    Fiber 30 grams    Whole Grain Foods 3 servings    Saturated Fats 15 max. grams    Fruits and Vegetables 5 servings/day    Sodium 2 grams      Personal Nutrition Goals   Nutrition Goal Eat a protein at ever meal    Personal Goal #2 Watch sodium intake, read labels of food ate often    Personal Goal #3 Watch portions and include veggies at larger meals    Comments Patient and wife present today, they have begun making changes likes choosing leaner meats, smaller portions, and watching sodium. He is drinking ~64oz of water daily. Some juice, cut out sweet tea. Spoke with them about sugary beverages and to work on reducing and cutting them out. Reviewed his A1C and carb sources, portions, and goal of 30-60g per meal. Went over [employer] handout, types of fats, sources, and how to read labels. Built out meals and snacks with foods he likes focusing on pairing a controlled amount of carbs with  healthy protein and including veggies at larger meals.      Intervention Plan   Intervention Prescribe, educate and counsel regarding individualized specific dietary modifications aiming towards targeted core components such as weight, hypertension, lipid management, diabetes, heart failure and other comorbidities.;Nutrition handout(s) given to patient.    Expected Outcomes Short Term Goal: Understand basic principles of dietary content, such as calories, fat, sodium, cholesterol and nutrients.;Short Term Goal: A plan has been developed with personal nutrition goals set during dietitian appointment.;Long Term Goal: Adherence to prescribed  nutrition plan.           Goals reviewed with patient; copy given to patient.

## 2023-08-05 NOTE — Progress Notes (Signed)
Daily Session Note  Patient Details  Name: Connor Haas. MRN: [id number] Date of Birth: 04/30/1956 Referring Provider:   Flowsheet Row Cardiac Rehab from 05/26/2023 in [facility] Cardiac and Pulmonary Rehab  Referring Provider End       Encounter Date: 08/05/2023  Check In:  Session Check In - 08/05/23 1536       Check-In   Supervising physician immediately available to respond to emergencies See telemetry face sheet for immediately available ER MD    Location [facility]-Cardiac & Pulmonary Rehab    Staff Present Elige Ko, RCP,RRT,BSRT;Missael Ferrari Jewel Baize, RN Atilano Median, RN, ADN    Virtual Visit No    Medication changes reported     No    Fall or balance concerns reported    No    Warm-up and Cool-down Performed on first and last piece of equipment    Resistance Training Performed Yes    VAD Patient? No    PAD/SET Patient? No      Pain Assessment   Currently in Pain? No/denies                Social History   Tobacco Use  Smoking Status Former   Current packs/day: 0.00   Types: Cigarettes   Quit date: 10/12/2012   Years since quitting: 10.8  Smokeless Tobacco Never    Goals Met:  Independence with exercise equipment Exercise tolerated well No report of concerns or symptoms today Strength training completed today  Goals Unmet:  Not Applicable  Comments:   6 Minute Walk     Row Name 05/26/23 1535 08/05/23 1544       6 Minute Walk   Phase Initial Discharge    Distance 1340 feet 1500 feet    Distance % Change -- 12 %    Distance Feet Change -- 160 ft    Walk Time 6 minutes 6 minutes    # of Rest Breaks 0 0    MPH 2.54 2.84    METS 2.97 3.6    RPE 11 11    Perceived Dyspnea  0 0    VO2 Peak 10.4 12.61    Symptoms No No    Resting HR 59 bpm 64 bpm    Resting BP 102/58 100/52    Resting Oxygen Saturation  99 % 96 %    Exercise Oxygen Saturation  during 6 min walk 100 % 96 %    Max Ex. HR 92 bpm 95 bpm    Max Ex. BP 122/54 158/70    2 Minute Post  BP 102/58 --             6 Minute Walk     Row Name 05/26/23 1535 08/05/23 1544       6 Minute Walk   Phase Initial Discharge    Distance 1340 feet 1500 feet    Distance % Change -- 12 %    Distance Feet Change -- 160 ft    Walk Time 6 minutes 6 minutes    # of Rest Breaks 0 0    MPH 2.54 2.84    METS 2.97 3.6    RPE 11 11    Perceived Dyspnea  0 0    VO2 Peak 10.4 12.61    Symptoms No No    Resting HR 59 bpm 64 bpm    Resting BP 102/58 100/52    Resting Oxygen Saturation  99 % 96 %    Exercise Oxygen Saturation  during  6 min walk 100 % 96 %    Max Ex. HR 92 bpm 95 bpm    Max Ex. BP 122/54 158/70    2 Minute Post BP 102/58 --             Pt able to follow exercise prescription today without complaint.  Will continue to monitor for progression.    Dr. Bethann Punches is Medical Director for [facility] Cardiac Rehabilitation.  Dr. Vida Rigger is Medical Director for [facility] Pulmonary Rehabilitation.

## 2023-08-09 ENCOUNTER — Encounter: Payer: Medicare Other | Admitting: *Deleted

## 2023-08-09 DIAGNOSIS — Z955 Presence of coronary angioplasty implant and graft: Secondary | ICD-10-CM

## 2023-08-09 DIAGNOSIS — I214 Non-ST elevation (NSTEMI) myocardial infarction: Secondary | ICD-10-CM

## 2023-08-09 NOTE — Progress Notes (Signed)
Daily Session Note  Patient Details  Name: Connor Haas. MRN: [id number] Date of Birth: 04-23-1956 Referring Provider:   Flowsheet Row Cardiac Rehab from 05/26/2023 in [facility] Cardiac and Pulmonary Rehab  Referring Provider End       Encounter Date: 08/09/2023  Check In:  Session Check In - 08/09/23 1539       Check-In   Supervising physician immediately available to respond to emergencies See telemetry face sheet for immediately available ER MD    Location [facility]-Cardiac & Pulmonary Rehab    Staff Present Maxon Suzzette Righter, , Exercise Physiologist;Eunie Lawn Jewel Baize, RN BSN;Joseph Hood, RCP,RRT,BSRT;Kelly Holyoke, BS, ACSM CEP, Exercise Physiologist    Virtual Visit No    Medication changes reported     No    Fall or balance concerns reported    No    Warm-up and Cool-down Performed on first and last piece of equipment    Resistance Training Performed Yes    VAD Patient? No    PAD/SET Patient? No      Pain Assessment   Currently in Pain? No/denies                Social History   Tobacco Use  Smoking Status Former   Current packs/day: 0.00   Types: Cigarettes   Quit date: 10/12/2012   Years since quitting: 10.8  Smokeless Tobacco Never    Goals Met:  Independence with exercise equipment Exercise tolerated well No report of concerns or symptoms today Strength training completed today  Goals Unmet:  Not Applicable  Comments: Pt able to follow exercise prescription today without complaint.  Will continue to monitor for progression.    Dr. Bethann Punches is Medical Director for [facility] Cardiac Rehabilitation.  Dr. Vida Rigger is Medical Director for [facility] Pulmonary Rehabilitation.

## 2023-08-11 ENCOUNTER — Encounter: Payer: Medicare Other | Admitting: *Deleted

## 2023-08-11 ENCOUNTER — Encounter: Payer: Self-pay | Admitting: *Deleted

## 2023-08-11 DIAGNOSIS — I214 Non-ST elevation (NSTEMI) myocardial infarction: Secondary | ICD-10-CM

## 2023-08-11 NOTE — Progress Notes (Signed)
Cardiac Individual Treatment Plan  Patient Details  Name: Connor Haas. MRN: [id number] Date of Birth: 13-May-1956 Referring Provider:   Flowsheet Row Cardiac Rehab from 05/26/2023 in [facility] Cardiac and Pulmonary Rehab  Referring Provider End       Initial Encounter Date:  Flowsheet Row Cardiac Rehab from 05/26/2023 in [facility] Cardiac and Pulmonary Rehab  Date 05/26/23       Visit Diagnosis: NSTEMI (non-ST elevation myocardial infarction) [facility])  Patient's Home Medications on Admission:  Current Outpatient Medications:    ALPRAZolam (XANAX) 1 MG tablet, Take 0.5 mg by mouth 3 (three) times daily as needed for sleep. For anxiety, Disp: , Rfl:    aspirin EC 81 MG tablet, Take 1 tablet (81 mg total) by mouth daily. Swallow whole., Disp: 120 tablet, Rfl: 3   carvedilol (COREG) 3.125 MG tablet, Take 1 tablet (3.125 mg total) by mouth 2 (two) times daily with a meal., Disp: 60 tablet, Rfl: 11   empagliflozin (JARDIANCE) 10 MG TABS tablet, Take 1 tablet (10 mg total) by mouth daily., Disp: 90 tablet, Rfl: 3   loperamide (IMODIUM A-D) 2 MG tablet, Take 2 mg by mouth 4 (four) times daily as needed for diarrhea or loose stools., Disp: , Rfl:    losartan (COZAAR) 25 MG tablet, Take 0.5 tablets (12.5 mg total) by mouth daily., Disp: 45 tablet, Rfl: 3   nitroGLYCERIN (NITROSTAT) 0.4 MG SL tablet, Place 1 tablet (0.4 mg total) under the tongue every 5 (five) minutes x 3 doses as needed for chest pain., Disp: 25 tablet, Rfl: 1   rosuvastatin (CRESTOR) 40 MG tablet, Take 1 tablet (40 mg total) by mouth daily., Disp: 90 tablet, Rfl: 3   ticagrelor (BRILINTA) 90 MG TABS tablet, Take 1 tablet (90 mg total) by mouth 2 (two) times daily., Disp: 60 tablet, Rfl: 11  Past Medical History: Past Medical History:  Diagnosis Date   Acute appendicitis 10/05/2013   Lap Appy 10/05/13    Anxiety    Chronic pain    Degenerative joint disease    Depression    GERD (gastroesophageal reflux disease)     Hyperlipidemia    Hypertension    Pneumonia     Tobacco Use: Social History   Tobacco Use  Smoking Status Former   Current packs/day: 0.00   Types: Cigarettes   Quit date: 10/12/2012   Years since quitting: 10.8  Smokeless Tobacco Never    Labs: Review Flowsheet  More data may exist      Latest Ref Rng & Units 07/14/2007 06/26/2010 12/28/2012 05/02/2023 06/10/2023  Labs for ITP Cardiac and Pulmonary Rehab  Cholestrol 0 - 200 mg/dL 409  811  - 914  97   LDL (calc) 0 - 99 mg/dL - - - 46  32   Direct LDL mg/dL 782.9  562.1  - - -  HDL-C >40 mg/dL 30.8  65.78  - 48  41   Trlycerides <150 mg/dL 469  629.5  - 284  132   Hemoglobin A1c 4.8 - 5.6 % - - - 6.2  -  TCO2 0 - 100 mmol/L - - 26  - -    Details             Exercise Target Goals: Exercise Program Goal: Individual exercise prescription set using results from initial 6 min walk test and THRR while considering  patient's activity barriers and safety.   Exercise Prescription Goal: Initial exercise prescription builds to 30-45 minutes a day of aerobic  activity, 2-3 days per week.  Home exercise guidelines will be given to patient during program as part of exercise prescription that the participant will acknowledge.   Education: Aerobic Exercise: - Group verbal and visual presentation on the components of exercise prescription. Introduces F.I.T.T principle from ACSM for exercise prescriptions.  Reviews F.I.T.T. principles of aerobic exercise including progression. Written material given at graduation. Flowsheet Row Cardiac Rehab from 08/04/2023 in [facility] Cardiac and Pulmonary Rehab  Education need identified 05/26/23       Education: Resistance Exercise: - Group verbal and visual presentation on the components of exercise prescription. Introduces F.I.T.T principle from ACSM for exercise prescriptions  Reviews F.I.T.T. principles of resistance exercise including progression. Written material given at graduation. Flowsheet  Row Cardiac Rehab from 08/04/2023 in [facility] Cardiac and Pulmonary Rehab  Date 06/02/23  Educator [facility]  Instruction Review Code 1- [employer] Understanding        Education: Exercise & Equipment Safety: - Individual verbal instruction and demonstration of equipment use and safety with use of the equipment. Flowsheet Row Cardiac Rehab from 08/04/2023 in [facility] Cardiac and Pulmonary Rehab  Date 05/26/23  Educator [facility]  Instruction Review Code 1- Verbalizes Understanding       Education: Exercise Physiology & General Exercise Guidelines: - Group verbal and written instruction with models to review the exercise physiology of the cardiovascular system and associated critical values. Provides general exercise guidelines with specific guidelines to those with heart or lung disease.  Flowsheet Row Cardiac Rehab from 08/04/2023 in [facility] Cardiac and Pulmonary Rehab  Education need identified 05/26/23  Date 06/02/23  Educator [facility]  Instruction Review Code 1- [employer] Understanding       Education: Flexibility, Balance, Mind/Body Relaxation: - Group verbal and visual presentation with interactive activity on the components of exercise prescription. Introduces F.I.T.T principle from ACSM for exercise prescriptions. Reviews F.I.T.T. principles of flexibility and balance exercise training including progression. Also discusses the mind body connection.  Reviews various relaxation techniques to help reduce and manage stress (i.e. Deep breathing, progressive muscle relaxation, and visualization). Balance handout provided to take home. Written material given at graduation.   Activity Barriers & Risk Stratification:  Activity Barriers & Cardiac Risk Stratification - 05/26/23 1537       Activity Barriers & Cardiac Risk Stratification   Activity Barriers None    Cardiac Risk Stratification High             6 Minute Walk:  6 Minute Walk     Row Name 05/26/23 1535 08/05/23 1544       6 Minute Walk    Phase Initial Discharge    Distance 1340 feet 1500 feet    Distance % Change -- 12 %    Distance Feet Change -- 160 ft    Walk Time 6 minutes 6 minutes    # of Rest Breaks 0 0    MPH 2.54 2.84    METS 2.97 3.6    RPE 11 11    Perceived Dyspnea  0 0    VO2 Peak 10.4 12.61    Symptoms No No    Resting HR 59 bpm 64 bpm    Resting BP 102/58 100/52    Resting Oxygen Saturation  99 % 96 %    Exercise Oxygen Saturation  during 6 min walk 100 % 96 %    Max Ex. HR 92 bpm 95 bpm    Max Ex. BP 122/54 158/70    2 Minute Post BP 102/58 --  Oxygen Initial Assessment:   Oxygen Re-Evaluation:   Oxygen Discharge (Final Oxygen Re-Evaluation):   Initial Exercise Prescription:  Initial Exercise Prescription - 05/26/23 1500       Date of Initial Exercise RX and Referring Provider   Date 05/26/23    Referring Provider End      Oxygen   Maintain Oxygen Saturation 88% or higher      Treadmill   MPH 2.5    Grade 0.5    Minutes 15    METs 3.09      NuStep   Level 3    SPM 80    Minutes 15    METs 2.97      Biostep-RELP   Level 3    SPM 50    Minutes 15    METs 2.97      Prescription Details   Frequency (times per week) 3    Duration Progress to 30 minutes of continuous aerobic without signs/symptoms of physical distress      Intensity   THRR 40-80% of Max Heartrate 96-134    Ratings of Perceived Exertion 11-15    Perceived Dyspnea 0-4      Progression   Progression Continue to progress workloads to maintain intensity without signs/symptoms of physical distress.      Resistance Training   Training Prescription Yes    Weight 4lb    Reps 10-15             Perform Capillary Blood Glucose checks as needed.  Exercise Prescription Changes:   Exercise Prescription Changes     Row Name 05/26/23 1500 06/17/23 1600 06/29/23 1500 07/15/23 1700 07/21/23 1500     Response to Exercise   Blood Pressure (Admit) 102/58 108/60 100/62 118/62 --   Blood  Pressure (Exercise) 122/54 142/64 144/62 156/70 --   Blood Pressure (Exit) 102/58 106/58 118/64 106/64 --   Heart Rate (Admit) 59 bpm 74 bpm 65 bpm 75 bpm --   Heart Rate (Exercise) 92 bpm 126 bpm 119 bpm 129 bpm --   Heart Rate (Exit) 74 bpm 90 bpm 71 bpm 89 bpm --   Oxygen Saturation (Admit) 99 % -- -- -- --   Oxygen Saturation (Exercise) 100 % -- -- -- --   Oxygen Saturation (Exit) 100 % -- -- -- --   Rating of Perceived Exertion (Exercise) 11 14 13 15  --   Perceived Dyspnea (Exercise) 0 -- -- -- --   Symptoms none -- none none --   Comments results First two weeks of exercise -- -- --   Duration -- Continue with 30 min of aerobic exercise without signs/symptoms of physical distress. Continue with 30 min of aerobic exercise without signs/symptoms of physical distress. Continue with 30 min of aerobic exercise without signs/symptoms of physical distress. Continue with 30 min of aerobic exercise without signs/symptoms of physical distress.   Intensity -- THRR unchanged THRR unchanged THRR unchanged THRR unchanged     Progression   Progression Continue to progress workloads to maintain intensity without signs/symptoms of physical distress. Continue to progress workloads to maintain intensity without signs/symptoms of physical distress. Continue to progress workloads to maintain intensity without signs/symptoms of physical distress. Continue to progress workloads to maintain intensity without signs/symptoms of physical distress. Continue to progress workloads to maintain intensity without signs/symptoms of physical distress.   Average METs -- 3.26 3.56 4 4     Resistance Training   Training Prescription -- Yes Yes Yes Yes   Weight --  3 lb 3 lb 3 lb 3 lb   Reps -- 10-15 10-15 10-15 10-15     Interval Training   Interval Training -- No No No No     Treadmill   MPH -- 3.1 3.1 3.2 3.2   Grade -- 1 1.5 1.5 1.5   Minutes -- 15 15 15 15    METs -- 3.8 4.01 4.11 4.11     NuStep   Level --  6 -- 5 5   Minutes -- 15 -- 15 15   METs -- 3.9 -- 3.6 3.6     REL-XR   Level -- -- 3 4 4    Minutes -- -- 15 15 15    METs -- -- 3.3 -- --     T5 Nustep   Level -- 5  T6 Nustep -- 5  T6 5  T6   Minutes -- 15 -- 15 15   METs -- 3 -- 3.8 3.8     Biostep-RELP   Level -- 2 3 5 5    Minutes -- 15 15 15 15    METs -- 3.53 3 4 4      Home Exercise Plan   Plans to continue exercise at -- -- -- -- Home (comment)  walk, weights, and look into a silver sneakers membership   Frequency -- -- -- -- Add 2 additional days to program exercise sessions.   Initial Home Exercises Provided -- -- -- -- 07/21/23     Oxygen   Maintain Oxygen Saturation -- 88% or higher 88% or higher 88% or higher 88% or higher    Row Name 07/29/23 1400             Response to Exercise   Blood Pressure (Admit) 98/62       Blood Pressure (Exit) 118/66       Heart Rate (Admit) 67 bpm       Heart Rate (Exercise) 134 bpm       Heart Rate (Exit) 89 bpm       Rating of Perceived Exertion (Exercise) 15       Perceived Dyspnea (Exercise) 0       Symptoms none       Duration Continue with 30 min of aerobic exercise without signs/symptoms of physical distress.       Intensity THRR unchanged         Progression   Progression Continue to progress workloads to maintain intensity without signs/symptoms of physical distress.       Average METs 4.6         Resistance Training   Training Prescription Yes       Weight 8 lb       Reps 10-15         Interval Training   Interval Training No         Treadmill   MPH 3.3       Grade 3       Minutes 15       METs 4.89         NuStep   Level 6       Minutes 15       METs 6.2         REL-XR   Level 8       Minutes 15       METs 4.5         T5 Nustep   Level 6  T6       Minutes 15  METs 4.1         Home Exercise Plan   Plans to continue exercise at Home (comment)  walk, weights, and look into a silver sneakers membership       Frequency Add 2 additional  days to program exercise sessions.       Initial Home Exercises Provided 07/21/23         Oxygen   Maintain Oxygen Saturation 88% or higher                Exercise Comments:   Exercise Comments     Row Name 05/31/23 1542           Exercise Comments First full day of exercise!  Patient was oriented to gym and equipment including functions, settings, policies, and procedures.  Patient's individual exercise prescription and treatment plan were reviewed.  All starting workloads were established based on the results of the 6 minute walk test done at initial orientation visit.  The plan for exercise progression was also introduced and progression will be customized based on patient's performance and goals.'                Exercise Goals and Review:   Exercise Goals     Row Name 05/26/23 1601             Exercise Goals   Increase Physical Activity Yes       Intervention Develop an individualized exercise prescription for aerobic and resistive training based on initial evaluation findings, risk stratification, comorbidities and participant's personal goals.;Provide advice, education, support and counseling about physical activity/exercise needs.       Expected Outcomes Short Term: Attend rehab on a regular basis to increase amount of physical activity.;Long Term: Add in home exercise to make exercise part of routine and to increase amount of physical activity.;Long Term: Exercising regularly at least 3-5 days a week.       Increase Strength and Stamina Yes       Expected Outcomes Short Term: Increase workloads from initial exercise prescription for resistance, speed, and METs.;Short Term: Perform resistance training exercises routinely during rehab and add in resistance training at home;Long Term: Improve cardiorespiratory fitness, muscular endurance and strength as measured by increased METs and functional capacity ( )       Able to understand and use rate of perceived  exertion (RPE) scale Yes       Intervention Provide education and explanation on how to use RPE scale       Expected Outcomes Short Term: Able to use RPE daily in rehab to express subjective intensity level;Long Term:  Able to use RPE to guide intensity level when exercising independently       Able to understand and use Dyspnea scale Yes       Intervention Provide education and explanation on how to use Dyspnea scale       Expected Outcomes Short Term: Able to use Dyspnea scale daily in rehab to express subjective sense of shortness of breath during exertion;Long Term: Able to use Dyspnea scale to guide intensity level when exercising independently       Knowledge and understanding of Target Heart Rate Range (THRR) Yes       Intervention Provide education and explanation of THRR including how the numbers were predicted and where they are located for reference       Expected Outcomes Short Term: Able to state/look up THRR;Short Term: Able to use daily as guideline for intensity in rehab;Long  Term: Able to use THRR to govern intensity when exercising independently       Able to check pulse independently Yes       Intervention Provide education and demonstration on how to check pulse in carotid and radial arteries.;Review the importance of being able to check your own pulse for safety during independent exercise       Expected Outcomes Short Term: Able to explain why pulse checking is important during independent exercise;Long Term: Able to check pulse independently and accurately       Understanding of Exercise Prescription Yes       Intervention Provide education, explanation, and written materials on patient's individual exercise prescription       Expected Outcomes Short Term: Able to explain program exercise prescription;Long Term: Able to explain home exercise prescription to exercise independently                Exercise Goals Re-Evaluation :  Exercise Goals Re-Evaluation     Row Name  05/31/23 1542 06/17/23 1628 06/29/23 1520 07/15/23 1740 07/21/23 1555     Exercise Goal Re-Evaluation   Exercise Goals Review Increase Physical Activity;Able to understand and use rate of perceived exertion (RPE) scale;Knowledge and understanding of Target Heart Rate Range (THRR);Understanding of Exercise Prescription;Increase Strength and Stamina;Able to check pulse independently Increase Physical Activity;Increase Strength and Stamina;Understanding of Exercise Prescription Increase Physical Activity;Increase Strength and Stamina;Understanding of Exercise Prescription Increase Physical Activity;Increase Strength and Stamina;Understanding of Exercise Prescription Increase Physical Activity;Able to understand and use rate of perceived exertion (RPE) scale;Knowledge and understanding of Target Heart Rate Range (THRR);Understanding of Exercise Prescription;Increase Strength and Stamina;Able to understand and use Dyspnea scale;Able to check pulse independently   Comments Reviewed RPE  and dyspnea scale, THR and program prescription with pt today.  Pt voiced understanding and was given a copy of goals to take home. Connor Haas is off to a good start in the program. He was able to increase his treadmill workload up to a speed of 3.1 mph and an incline of 1%. He also improved to level 6 on the T4 nustep and level 5 on the T6 nustep. We will continue to monitor his progress in the program. Connor Haas is doing well in rehab. He increased his treadmill workload by increasing his incline to 1.5% while maintaining a speed of 3.1 mph. He also improved to level 3 on the biostep and XR. We will continue to monitor his progress in the program. Connor Haas continues to make improvements in rehab. He recently increased his level on the XR from 3 to 4. He also increased his level on the Biostep from 3 to 5. We will continue to monitor his progress in the program. Reviewed home exercise with pt today.  Pt plans to walk, use weights and look into  a silver sneakers membership for exercise.  Reviewed THR, pulse, RPE, sign and symptoms, pulse oximetery and when to call 911 or MD.  Also discussed weather considerations and indoor options.  Pt voiced understanding.   Expected Outcomes Short: Use RPE daily to regulate intensity.  Long: Follow program prescription in THR. Short: Continue to progressively increase treadmill workload. Long: Continue exercise to improve strength and stamina. Short: Continue to progressively increase treadmill workload. Long: Continue exercise to improve strength and stamina. Short: Continue to progressively increase treadmill workload. Long: Continue exercise to improve strength and stamina. Short: add1-2 days a week of exercise at home on off days of rehab. Long: become independent with exercise routine.  Row Name 07/29/23 1419             Exercise Goal Re-Evaluation   Exercise Goals Review Increase Physical Activity;Increase Strength and Stamina;Understanding of Exercise Prescription       Comments Connor Haas continues to make improvements in rehab. He has recently increased his incline on the treadmill from 1.5% to 3%. He was also able to increase his level on the XR from level 4 to level 8. We will continue to monitor his progress in the program.       Expected Outcomes Short: Continue to increase workloads on the treadmill when comfortable. Long: Continue to exercise to improve strength and stamina.                Discharge Exercise Prescription (Final Exercise Prescription Changes):  Exercise Prescription Changes - 07/29/23 1400       Response to Exercise   Blood Pressure (Admit) 98/62    Blood Pressure (Exit) 118/66    Heart Rate (Admit) 67 bpm    Heart Rate (Exercise) 134 bpm    Heart Rate (Exit) 89 bpm    Rating of Perceived Exertion (Exercise) 15    Perceived Dyspnea (Exercise) 0    Symptoms none    Duration Continue with 30 min of aerobic exercise without signs/symptoms of physical distress.     Intensity THRR unchanged      Progression   Progression Continue to progress workloads to maintain intensity without signs/symptoms of physical distress.    Average METs 4.6      Resistance Training   Training Prescription Yes    Weight 8 lb    Reps 10-15      Interval Training   Interval Training No      Treadmill   MPH 3.3    Grade 3    Minutes 15    METs 4.89      NuStep   Level 6    Minutes 15    METs 6.2      REL-XR   Level 8    Minutes 15    METs 4.5      T5 Nustep   Level 6   T6   Minutes 15    METs 4.1      Home Exercise Plan   Plans to continue exercise at Home (comment)   walk, weights, and look into a silver sneakers membership   Frequency Add 2 additional days to program exercise sessions.    Initial Home Exercises Provided 07/21/23      Oxygen   Maintain Oxygen Saturation 88% or higher             Nutrition:  Target Goals: Understanding of nutrition guidelines, daily intake of sodium 1500mg , cholesterol 200mg , calories 30% from fat and 7% or less from saturated fats, daily to have 5 or more servings of fruits and vegetables.  Education: All About Nutrition: -Group instruction provided by verbal, written material, interactive activities, discussions, models, and posters to present general guidelines for heart healthy nutrition including fat, fiber, MyPlate, the role of sodium in heart healthy nutrition, utilization of the nutrition label, and utilization of this knowledge for meal planning. Follow up email sent as well. Written material given at graduation. Flowsheet Row Cardiac Rehab from 08/04/2023 in [facility] Cardiac and Pulmonary Rehab  Date 06/16/23  Educator Ottis Stain  Instruction Review Code 1- Verbalizes Understanding       Biometrics:  Pre Biometrics - 05/26/23 1549       Pre  Biometrics   Height 6' 0.5" (1.842 m)    Weight 217 lb 14.4 oz (98.8 kg)    Waist Circumference 43.4 inches    Hip Circumference 44.5 inches    Waist to Hip  Ratio 0.98 %    BMI (Calculated) 29.13    Single Leg Stand 3.59 seconds             Post Biometrics - 08/05/23 1552        Post  Biometrics   Height 6' 0.5" (1.842 m)    Weight 209 lb (94.8 kg)    Waist Circumference 41 inches    Hip Circumference 43 inches    Waist to Hip Ratio 0.95 %    BMI (Calculated) 27.94    Single Leg Stand 30 seconds             Nutrition Therapy Plan and Nutrition Goals:  Nutrition Therapy & Goals - 05/26/23 1633       Nutrition Therapy   Diet Carb controlled, cardiac, low Na    Protein (specify units) 90    Fiber 30 grams    Whole Grain Foods 3 servings    Saturated Fats 15 max. grams    Fruits and Vegetables 5 servings/day    Sodium 2 grams      Personal Nutrition Goals   Nutrition Goal Eat a protein at ever meal    Personal Goal #2 Watch sodium intake, read labels of food ate often    Personal Goal #3 Watch portions and include veggies at larger meals    Comments Patient and wife present today, they have begun making changes likes choosing leaner meats, smaller portions, and watching sodium. He is drinking ~64oz of water daily. Some juice, cut out sweet tea. Spoke with them about sugary beverages and to work on reducing and cutting them out. Reviewed his A1C and carb sources, portions, and goal of 30-60g per meal. Went over [employer] handout, types of fats, sources, and how to read labels. Built out meals and snacks with foods he likes focusing on pairing a controlled amount of carbs with healthy protein and including veggies at larger meals.      Intervention Plan   Intervention Prescribe, educate and counsel regarding individualized specific dietary modifications aiming towards targeted core components such as weight, hypertension, lipid management, diabetes, heart failure and other comorbidities.;Nutrition handout(s) given to patient.    Expected Outcomes Short Term Goal: Understand basic principles of dietary content, such as  calories, fat, sodium, cholesterol and nutrients.;Short Term Goal: A plan has been developed with personal nutrition goals set during dietitian appointment.;Long Term Goal: Adherence to prescribed nutrition plan.             Nutrition Assessments:  MEDIFICTS Score Key: >=70 Need to make dietary changes  40-70 Heart Healthy Diet <= 40 Therapeutic Level Cholesterol Diet  Flowsheet Row Cardiac Rehab from 08/09/2023 in [facility] Cardiac and Pulmonary Rehab  Picture Your Plate Total Score on Discharge 72      Picture Your Plate Scores: <09 Unhealthy dietary pattern with much room for improvement. 41-50 Dietary pattern unlikely to meet recommendations for good health and room for improvement. 51-60 More healthful dietary pattern, with some room for improvement.  >60 Healthy dietary pattern, although there may be some specific behaviors that could be improved.    Nutrition Goals Re-Evaluation:  Nutrition Goals Re-Evaluation     Row Name 06/17/23 1614 07/29/23 1612  Goals   Current Weight 216 lb (98 kg) 210 lb (95.3 kg)      Nutrition Goal Eat less sodium --      Comment Connor Haas states his cholesterol is down. He has been eating more protien and reading food labels. He has been trying to drink more water and less tea. He is trying to make healthy choices even though "it isn't fun." His wife helps him stay motivated. He has been incorporating changes slowly and can tell a difference in how he feels. He wants to get around 200lbs.      Expected Outcome Short: keep eating a lower sodium diet. Long: adhere to a heart healthy diet. Short: make low sodium choices and manage serving sizes Long: independently manage a heart healthy diet and reach goal weight               Nutrition Goals Discharge (Final Nutrition Goals Re-Evaluation):  Nutrition Goals Re-Evaluation - 07/29/23 1612       Goals   Current Weight 210 lb (95.3 kg)    Comment He has been trying to drink more water  and less tea. He is trying to make healthy choices even though "it isn't fun." His wife helps him stay motivated. He has been incorporating changes slowly and can tell a difference in how he feels. He wants to get around 200lbs.    Expected Outcome Short: make low sodium choices and manage serving sizes Long: independently manage a heart healthy diet and reach goal weight             Psychosocial: Target Goals: Acknowledge presence or absence of significant depression and/or stress, maximize coping skills, provide positive support system. Participant is able to verbalize types and ability to use techniques and skills needed for reducing stress and depression.   Education: Stress, Anxiety, and Depression - Group verbal and visual presentation to define topics covered.  Reviews how body is impacted by stress, anxiety, and depression.  Also discusses healthy ways to reduce stress and to treat/manage anxiety and depression.  Written material given at graduation. Flowsheet Row Cardiac Rehab from 08/04/2023 in [facility] Cardiac and Pulmonary Rehab  Date 07/21/23  Educator SB  Instruction Review Code 1- [employer] Understanding       Education: Sleep Hygiene -Provides group verbal and written instruction about how sleep can affect your health.  Define sleep hygiene, discuss sleep cycles and impact of sleep habits. Review good sleep hygiene tips.    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 Nothing stems his anxiety and is there. He stresses about alot of little things and life situations. His wife is a good support system for it.      Family Dynamics   Good Support System? Yes      Barriers   Psychosocial barriers to participate in program The patient should  benefit from training in stress management and relaxation.;There are no identifiable barriers or psychosocial needs.      Screening Interventions   Interventions Encouraged to exercise;To provide support and resources with identified psychosocial needs;Provide feedback about the scores to participant    Expected Outcomes Short Term goal: Utilizing psychosocial counselor, staff and physician to assist with identification of specific Stressors or current issues interfering with healing process. Setting  desired goal for each stressor or current issue identified.;Long Term Goal: Stressors or current issues are controlled or eliminated.;Short Term goal: Identification and review with participant of any Quality of Life or Depression concerns found by scoring the questionnaire.;Long Term goal: The participant improves quality of Life and PHQ9 Scores as seen by post scores and/or verbalization of changes             Quality of Life Scores:   Quality of Life - 08/09/23 1552       Quality of Life   Select Quality of Life      Quality of Life Scores   Health/Function Pre 25.1 %    Health/Function Post 24.8 %    Health/Function % Change -1.2 %    Socioeconomic Pre 20.13 %    Socioeconomic Post 23.14 %    Socioeconomic % Change  14.95 %    Psych/Spiritual Pre 22.71 %    Psych/Spiritual Post 21.43 %    Psych/Spiritual % Change -5.64 %    Family Pre 25.2 %    Family Post 27.6 %    Family % Change 9.52 %    GLOBAL Pre 23.5 %    GLOBAL Post 24.18 %    GLOBAL % Change 2.89 %            Scores of 19 and below usually indicate a poorer quality of life in these areas.  A difference of  2-3 points is a clinically meaningful difference.  A difference of 2-3 points in the total score of the Quality of Life Index has been associated with significant improvement in overall quality of life, self-image, physical symptoms, and general health in studies assessing change in quality of  life.  PHQ-9: Review Flowsheet       08/09/2023 05/26/2023  Depression screen PHQ 2/9  Decreased Interest - 0  Down, Depressed, Hopeless 0 1  PHQ - 2 Score 0 1  Altered sleeping 0 1  Tired, decreased energy 0 1  Change in appetite 0 0  Feeling bad or failure about yourself  0 0  Trouble concentrating 0 0  Moving slowly or fidgety/restless 0 1  Suicidal thoughts 0 0  PHQ-9 Score 0 4  Difficult doing work/chores - Not difficult at all    Details           Interpretation of Total Score  Total Score Depression Severity:  1-4 = Minimal depression, 5-9 = Mild depression, 10-14 = Moderate depression, 15-19 = Moderately severe depression, 20-27 = Severe depression   Psychosocial Evaluation and Intervention:  Psychosocial Evaluation - 05/20/23 1556       Psychosocial Evaluation & Interventions   Interventions Encouraged to exercise with the program and follow exercise prescription;Relaxation education;Stress management education    Comments 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 Nothing stems his anxiety and is there. He stresses about alot of little things and life situations. His wife is a good support system for it.    Expected Outcomes Short: Start [facility] to help with mood. Long: Maintain a healthy mental state    Continue Psychosocial Services  Follow up required by staff             Psychosocial Re-Evaluation:  Psychosocial Re-Evaluation     Row Name 06/17/23 1619 07/29/23 1602           Psychosocial Re-Evaluation   Current issues with Current Anxiety/Panic Current Stress Concerns;Current Anxiety/Panic      Comments Patient  reports no issues with their current mental states, sleep, stress, depression or anxiety. Will follow up with patient in a few weeks for any changes. Connor Haas reports sometimes he feels stressed while driving sometimes. He is stressed and anxious some about his appointment coming up to discuss his ICD. He feels like he is  managing his anxiety better since starting the program with exercise and his medication as needed. He talked with a financial planner today about retiring and plans to do so within the next year.      Expected Outcomes Short: Continue to exercise regularly to support mental health and notify staff of any changes. Long: maintain mental health and well being through teaching of rehab or prescribed medications independently. Short: continue to take his medication and exercise, take time to enjoy hobbies (mowing, fishing) Long: indepdently exercise and manage positive healthy habits      Interventions Encouraged to attend Cardiac Rehabilitation for the exercise Encouraged to attend Cardiac Rehabilitation for the exercise      Continue Psychosocial Services  Follow up required by staff No Follow up required               Psychosocial Discharge (Final Psychosocial Re-Evaluation):  Psychosocial Re-Evaluation - 07/29/23 1602       Psychosocial Re-Evaluation   Current issues with Current Stress Concerns;Current Anxiety/Panic    Comments Connor Haas reports sometimes he feels stressed while driving sometimes. He is stressed and anxious some about his appointment coming up to discuss his ICD. He feels like he is managing his anxiety better since starting the program with exercise and his medication as needed. He talked with a financial planner today about retiring and plans to do so within the next year.    Expected Outcomes Short: continue to take his medication and exercise, take time to enjoy hobbies (mowing, fishing) Long: indepdently exercise and manage positive healthy habits    Interventions Encouraged to attend Cardiac Rehabilitation for the exercise    Continue Psychosocial Services  No Follow up required             Vocational Rehabilitation: Provide vocational rehab assistance to qualifying candidates.   Vocational Rehab Evaluation & Intervention:   Education: Education Goals: Education  classes will be provided on a variety of topics geared toward better understanding of heart health and risk factor modification. Participant will state understanding/return demonstration of topics presented as noted by education test scores.  Learning Barriers/Preferences:  Learning Barriers/Preferences - 05/20/23 1554       Learning Barriers/Preferences   Learning Barriers None    Learning Preferences None             General Cardiac Education Topics:  AED/CPR: - Group verbal and written instruction with the use of models to demonstrate the basic use of the AED with the basic ABC's of resuscitation.   Anatomy and Cardiac Procedures: - Group verbal and visual presentation and models provide information about basic cardiac anatomy and function. Reviews the testing methods done to diagnose heart disease and the outcomes of the test results. Describes the treatment choices: Medical Management, Angioplasty, or Coronary Bypass Surgery for treating various heart conditions including Myocardial Infarction, Angina, Valve Disease, and Cardiac Arrhythmias.  Written material given at graduation.   Medication Safety: - Group verbal and visual instruction to review commonly prescribed medications for heart and lung disease. Reviews the medication, class of the drug, and side effects. Includes the steps to properly store meds and maintain the prescription regimen.  Written  material given at graduation.   Intimacy: - Group verbal instruction through game format to discuss how heart and lung disease can affect sexual intimacy. Written material given at graduation..   Know Your Numbers and Heart Failure: - Group verbal and visual instruction to discuss disease risk factors for cardiac and pulmonary disease and treatment options.  Reviews associated critical values for Overweight/Obesity, Hypertension, Cholesterol, and Diabetes.  Discusses basics of heart failure: signs/symptoms and treatments.   Introduces Heart Failure Zone chart for action plan for heart failure.  Written material given at graduation. Flowsheet Row Cardiac Rehab from 08/04/2023 in [facility] Cardiac and Pulmonary Rehab  Date 07/14/23  Educator SB  Instruction Review Code 1- Verbalizes Understanding       Infection Prevention: - Provides verbal and written material to individual with discussion of infection control including proper hand washing and proper equipment cleaning during exercise session. Flowsheet Row Cardiac Rehab from 08/04/2023 in [facility] Cardiac and Pulmonary Rehab  Date 05/26/23  Educator [facility]  Instruction Review Code 1- Verbalizes Understanding       Falls Prevention: - Provides verbal and written material to individual with discussion of falls prevention and safety. Flowsheet Row Cardiac Rehab from 08/04/2023 in [facility] Cardiac and Pulmonary Rehab  Date 05/26/23  Educator [facility]  Instruction Review Code 1- Verbalizes Understanding       Other: -Provides group and verbal instruction on various topics (see comments)   Knowledge Questionnaire Score:  Knowledge Questionnaire Score - 08/09/23 1552       Knowledge Questionnaire Score   Post Score 25/26             Core Components/Risk Factors/Patient Goals at Admission:  Personal Goals and Risk Factors at Admission - 05/26/23 1559       Core Components/Risk Factors/Patient Goals on Admission    Weight Management Yes;Weight Loss    Intervention Weight Management: Develop a combined nutrition and exercise program designed to reach desired caloric intake, while maintaining appropriate intake of nutrient and fiber, sodium and fats, and appropriate energy expenditure required for the weight goal.;Weight Management: Provide education and appropriate resources to help participant work on and attain dietary goals.;Weight Management/Obesity: Establish reasonable short term and long term weight goals.;Obesity: Provide education and appropriate resources  to help participant work on and attain dietary goals.    Admit Weight 217 lb 14.4 oz (98.8 kg)    Goal Weight: Short Term 210 lb (95.3 kg)    Goal Weight: Long Term 200 lb (90.7 kg)    Expected Outcomes Short Term: Continue to assess and modify interventions until short term weight is achieved;Long Term: Adherence to nutrition and physical activity/exercise program aimed toward attainment of established weight goal;Weight Loss: Understanding of general recommendations for a balanced deficit meal plan, which promotes 1-2 lb weight loss per week and includes a negative energy balance of [phone] kcal/d;Understanding recommendations for meals to include 15-35% energy as protein, 25-35% energy from fat, 35-60% energy from carbohydrates, less than 200mg  of dietary cholesterol, 20-35 gm of total fiber daily;Understanding of distribution of calorie intake throughout the day with the consumption of 4-5 meals/snacks    Heart Failure Yes    Intervention Provide a combined exercise and nutrition program that is supplemented with education, support and counseling about heart failure. Directed toward relieving symptoms such as shortness of breath, decreased exercise tolerance, and extremity edema.    Expected Outcomes Improve functional capacity of life;Short term: Attendance in program 2-3 days a week with increased exercise capacity. Reported  lower sodium intake. Reported increased fruit and vegetable intake. Reports medication compliance.;Short term: Daily weights obtained and reported for increase. Utilizing diuretic protocols set by physician.;Long term: Adoption of self-care skills and reduction of barriers for early signs and symptoms recognition and intervention leading to self-care maintenance.    Hypertension Yes    Intervention Provide education on lifestyle modifcations including regular physical activity/exercise, weight management, moderate sodium restriction and increased consumption of fresh fruit,  vegetables, and low fat dairy, alcohol moderation, and smoking cessation.;Monitor prescription use compliance.    Expected Outcomes Short Term: Continued assessment and intervention until BP is < 140/38mm HG in hypertensive participants. < 130/36mm HG in hypertensive participants with diabetes, heart failure or chronic kidney disease.;Long Term: Maintenance of blood pressure at goal levels.    Lipids Yes    Intervention Provide education and support for participant on nutrition & aerobic/resistive exercise along with prescribed medications to achieve LDL 70mg , HDL >40mg .    Expected Outcomes Short Term: Participant states understanding of desired cholesterol values and is compliant with medications prescribed. Participant is following exercise prescription and nutrition guidelines.;Long Term: Cholesterol controlled with medications as prescribed, with individualized exercise RX and with personalized nutrition plan. Value goals: LDL < 70mg , HDL > 40 mg.             Education:Diabetes - Individual verbal and written instruction to review signs/symptoms of diabetes, desired ranges of glucose level fasting, after meals and with exercise. Acknowledge that pre and post exercise glucose checks will be done for 3 sessions at entry of program.   Core Components/Risk Factors/Patient Goals Review:   Goals and Risk Factor Review     Row Name 06/17/23 1616 07/29/23 1555           Core Components/Risk Factors/Patient Goals Review   Personal Goals Review Weight Management/Obesity Heart Failure;Weight Management/Obesity;Hypertension      Review Connor Haas wants to lose some weight. He has made some diet changes and is focusing on eating more fruit and vegetables. He has also cut back on the sweets. Connor Haas has been enjoying the program. He has been adhering to weighing himself every day. His weight fluctuates some, but he is staying on top of it. He is working on understanding his heart failure diagnosis  better. Staff gave him a heart failure education packet and discussed it with him. His next doctor's appointment is in November to discuss an ICD placement and if its the best option for him. His blood pressure has been lower with the medication and we discussed the need to check it.      Expected Outcomes Short: lose a few pounds in the next couple weeks. Long: reach weight goal of 200. Short: buy a blood pressure cuff and read heart failure packet. Long: independently manage heart failure symptoms, blood pressure and weight adherence               Core Components/Risk Factors/Patient Goals at Discharge (Final Review):   Goals and Risk Factor Review - 07/29/23 1555       Core Components/Risk Factors/Patient Goals Review   Personal Goals Review Heart Failure;Weight Management/Obesity;Hypertension    Review Connor Haas has been enjoying the program. He has been adhering to weighing himself every day. His weight fluctuates some, but he is staying on top of it. He is working on understanding his heart failure diagnosis better. Staff gave him a heart failure education packet and discussed it with him. His next doctor's appointment is in November to discuss an  ICD placement and if its the best option for him. His blood pressure has been lower with the medication and we discussed the need to check it.    Expected Outcomes Short: buy a blood pressure cuff and read heart failure packet. Long: independently manage heart failure symptoms, blood pressure and weight adherence             ITP Comments:  ITP Comments     Row Name 05/20/23 1553 05/26/23 1555 05/31/23 1542 06/16/23 0934 07/14/23 1000   ITP Comments Virtual Visit completed. Patient informed on EP and RD appointment and 6 Minute walk test. Patient also informed of patient health questionnaires on My Chart. Patient Verbalizes understanding. Visit diagnosis can be found in [facility] 05/08/2023. Completed and gym orientation. Initial ITP created and  sent for review to Dr. Bethann Punches, Medical Director. First full day of exercise!  Patient was oriented to gym and equipment including functions, settings, policies, and procedures.  Patient's individual exercise prescription and treatment plan were reviewed.  All starting workloads were established based on the results of the 6 minute walk test done at initial orientation visit.  The plan for exercise progression was also introduced and progression will be customized based on patient's performance and goals.' 30 Day review completed. Medical Director ITP review done, changes made as directed, and signed approval by Medical Director.     new to program 30 Day review completed. Medical Director ITP review done, changes made as directed, and signed approval by Medical Director.    Row Name 08/11/23 0841           ITP Comments 30 Day review completed. Medical Director ITP review done, changes made as directed, and signed approval by Medical Director.                Comments:

## 2023-08-11 NOTE — Progress Notes (Signed)
Daily Session Note  Patient Details  Name: Connor Haas. MRN: [id number] Date of Birth: 03-16-1956 Referring Provider:   Flowsheet Row Cardiac Rehab from 05/26/2023 in [facility] Cardiac and Pulmonary Rehab  Referring Provider End       Encounter Date: 08/11/2023  Check In:  Session Check In - 08/11/23 1613       Check-In   Supervising physician immediately available to respond to emergencies See telemetry face sheet for immediately available ER MD    Location [facility]-Cardiac & Pulmonary Rehab    Staff Present Cora Collum, RN, BSN, CCRP;Megan Katrinka Blazing, RN, Bobetta Lime, RCP,RRT,BSRT    Virtual Visit No    Medication changes reported     No    Fall or balance concerns reported    No    Warm-up and Cool-down Performed on first and last piece of equipment    Resistance Training Performed Yes    VAD Patient? No      Pain Assessment   Currently in Pain? No/denies                Social History   Tobacco Use  Smoking Status Former   Current packs/day: 0.00   Types: Cigarettes   Quit date: 10/12/2012   Years since quitting: 10.8  Smokeless Tobacco Never    Goals Met:  Independence with exercise equipment Exercise tolerated well No report of concerns or symptoms today  Goals Unmet:  Not Applicable  Comments: Pt able to follow exercise prescription today without complaint.  Will continue to monitor for progression.    Dr. Bethann Punches is Medical Director for [facility] Cardiac Rehabilitation.  Dr. Vida Rigger is Medical Director for [facility] Pulmonary Rehabilitation.

## 2023-08-12 NOTE — Progress Notes (Signed)
Discharge Note for  Connor Haas.     [birth year]         [name] graduated today from  rehab with 36 sessions completed.  Details of the patient's exercise prescription and what He needs to do in order to continue the prescription and progress were discussed with patient.  Patient was given a copy of prescription and goals.  Patient verbalized understanding. [name] plans to continue to exercise by exercising at home.     6 Minute Walk     Row Name 05/26/23 1535 08/05/23 1544       6 Minute Walk   Phase Initial Discharge    Distance 1340 feet 1500 feet    Distance % Change -- 12 %    Distance Feet Change -- 160 ft    Walk Time 6 minutes 6 minutes    # of Rest Breaks 0 0    MPH 2.54 2.84    METS 2.97 3.6    RPE 11 11    Perceived Dyspnea  0 0    VO2 Peak 10.4 12.61    Symptoms No No    Resting HR 59 bpm 64 bpm    Resting BP 102/58 100/52    Resting Oxygen Saturation  99 % 96 %    Exercise Oxygen Saturation  during 6 min walk 100 % 96 %    Max Ex. HR 92 bpm 95 bpm    Max Ex. BP 122/54 158/70    2 Minute Post BP 102/58 --

## 2023-08-12 NOTE — Progress Notes (Signed)
Cardiac Individual Treatment Plan  Patient Details  Name: Connor Haas. MRN: [id number] Date of Birth: April 02, 1956 Referring Provider:   Flowsheet Row Cardiac Rehab from 05/26/2023 in [facility] Cardiac and Pulmonary Rehab  Referring Provider End       Initial Encounter Date:  Flowsheet Row Cardiac Rehab from 05/26/2023 in [facility] Cardiac and Pulmonary Rehab  Date 05/26/23       Visit Diagnosis: NSTEMI (non-ST elevation myocardial infarction) [facility])  Patient's Home Medications on Admission:  Current Outpatient Medications:    ALPRAZolam (XANAX) 1 MG tablet, Take 0.5 mg by mouth 3 (three) times daily as needed for sleep. For anxiety, Disp: , Rfl:    aspirin EC 81 MG tablet, Take 1 tablet (81 mg total) by mouth daily. Swallow whole., Disp: 120 tablet, Rfl: 3   carvedilol (COREG) 3.125 MG tablet, Take 1 tablet (3.125 mg total) by mouth 2 (two) times daily with a meal., Disp: 60 tablet, Rfl: 11   empagliflozin (JARDIANCE) 10 MG TABS tablet, Take 1 tablet (10 mg total) by mouth daily., Disp: 90 tablet, Rfl: 3   loperamide (IMODIUM A-D) 2 MG tablet, Take 2 mg by mouth 4 (four) times daily as needed for diarrhea or loose stools., Disp: , Rfl:    losartan (COZAAR) 25 MG tablet, Take 0.5 tablets (12.5 mg total) by mouth daily., Disp: 45 tablet, Rfl: 3   nitroGLYCERIN (NITROSTAT) 0.4 MG SL tablet, Place 1 tablet (0.4 mg total) under the tongue every 5 (five) minutes x 3 doses as needed for chest pain., Disp: 25 tablet, Rfl: 1   rosuvastatin (CRESTOR) 40 MG tablet, Take 1 tablet (40 mg total) by mouth daily., Disp: 90 tablet, Rfl: 3   ticagrelor (BRILINTA) 90 MG TABS tablet, Take 1 tablet (90 mg total) by mouth 2 (two) times daily., Disp: 60 tablet, Rfl: 11  Past Medical History: Past Medical History:  Diagnosis Date   Acute appendicitis 10/05/2013   Lap Appy 10/05/13    Anxiety    Chronic pain    Degenerative joint disease    Depression    GERD (gastroesophageal reflux disease)     Hyperlipidemia    Hypertension    Pneumonia     Tobacco Use: Social History   Tobacco Use  Smoking Status Former   Current packs/day: 0.00   Types: Cigarettes   Quit date: 10/12/2012   Years since quitting: 10.8  Smokeless Tobacco Never    Labs: Review Flowsheet  More data may exist      Latest Ref Rng & Units 07/14/2007 06/26/2010 12/28/2012 05/02/2023 06/10/2023  Labs for ITP Cardiac and Pulmonary Rehab  Cholestrol 0 - 200 mg/dL 409  811  - 914  97   LDL (calc) 0 - 99 mg/dL - - - 46  32   Direct LDL mg/dL 782.9  562.1  - - -  HDL-C >40 mg/dL 30.8  65.78  - 48  41   Trlycerides <150 mg/dL 469  629.5  - 284  132   Hemoglobin A1c 4.8 - 5.6 % - - - 6.2  -  TCO2 0 - 100 mmol/L - - 26  - -    Details             Exercise Target Goals: Exercise Program Goal: Individual exercise prescription set using results from initial 6 min walk test and THRR while considering  patient's activity barriers and safety.   Exercise Prescription Goal: Initial exercise prescription builds to 30-45 minutes a day of aerobic  activity, 2-3 days per week.  Home exercise guidelines will be given to patient during program as part of exercise prescription that the participant will acknowledge.   Education: Aerobic Exercise: - Group verbal and visual presentation on the components of exercise prescription. Introduces F.I.T.T principle from ACSM for exercise prescriptions.  Reviews F.I.T.T. principles of aerobic exercise including progression. Written material given at graduation. Flowsheet Row Cardiac Rehab from 08/11/2023 in [facility] Cardiac and Pulmonary Rehab  Education need identified 05/26/23  Date 08/11/23  Educator MB  Instruction Review Code 1- [employer] Understanding       Education: Resistance Exercise: - Group verbal and visual presentation on the components of exercise prescription. Introduces F.I.T.T principle from ACSM for exercise prescriptions  Reviews F.I.T.T. principles of resistance  exercise including progression. Written material given at graduation. Flowsheet Row Cardiac Rehab from 08/11/2023 in [facility] Cardiac and Pulmonary Rehab  Date 06/02/23  Educator [facility]  Instruction Review Code 1- [employer] Understanding        Education: Exercise & Equipment Safety: - Individual verbal instruction and demonstration of equipment use and safety with use of the equipment. Flowsheet Row Cardiac Rehab from 08/11/2023 in [facility] Cardiac and Pulmonary Rehab  Date 05/26/23  Educator [facility]  Instruction Review Code 1- Verbalizes Understanding       Education: Exercise Physiology & General Exercise Guidelines: - Group verbal and written instruction with models to review the exercise physiology of the cardiovascular system and associated critical values. Provides general exercise guidelines with specific guidelines to those with heart or lung disease.  Flowsheet Row Cardiac Rehab from 08/11/2023 in [facility] Cardiac and Pulmonary Rehab  Education need identified 05/26/23  Date 06/02/23  Educator [facility]  Instruction Review Code 1- [employer] Understanding       Education: Flexibility, Balance, Mind/Body Relaxation: - Group verbal and visual presentation with interactive activity on the components of exercise prescription. Introduces F.I.T.T principle from ACSM for exercise prescriptions. Reviews F.I.T.T. principles of flexibility and balance exercise training including progression. Also discusses the mind body connection.  Reviews various relaxation techniques to help reduce and manage stress (i.e. Deep breathing, progressive muscle relaxation, and visualization). Balance handout provided to take home. Written material given at graduation.   Activity Barriers & Risk Stratification:  Activity Barriers & Cardiac Risk Stratification - 05/26/23 1537       Activity Barriers & Cardiac Risk Stratification   Activity Barriers None    Cardiac Risk Stratification High             6 Minute  Walk:  6 Minute Walk     Row Name 05/26/23 1535 08/05/23 1544       6 Minute Walk   Phase Initial Discharge    Distance 1340 feet 1500 feet    Distance % Change -- 12 %    Distance Feet Change -- 160 ft    Walk Time 6 minutes 6 minutes    # of Rest Breaks 0 0    MPH 2.54 2.84    METS 2.97 3.6    RPE 11 11    Perceived Dyspnea  0 0    VO2 Peak 10.4 12.61    Symptoms No No    Resting HR 59 bpm 64 bpm    Resting BP 102/58 100/52    Resting Oxygen Saturation  99 % 96 %    Exercise Oxygen Saturation  during 6 min walk 100 % 96 %    Max Ex. HR 92 bpm 95 bpm  Max Ex. BP 122/54 158/70    2 Minute Post BP 102/58 --             Oxygen Initial Assessment:   Oxygen Re-Evaluation:   Oxygen Discharge (Final Oxygen Re-Evaluation):   Initial Exercise Prescription:  Initial Exercise Prescription - 05/26/23 1500       Date of Initial Exercise RX and Referring Provider   Date 05/26/23    Referring Provider End      Oxygen   Maintain Oxygen Saturation 88% or higher      Treadmill   MPH 2.5    Grade 0.5    Minutes 15    METs 3.09      NuStep   Level 3    SPM 80    Minutes 15    METs 2.97      Biostep-RELP   Level 3    SPM 50    Minutes 15    METs 2.97      Prescription Details   Frequency (times per week) 3    Duration Progress to 30 minutes of continuous aerobic without signs/symptoms of physical distress      Intensity   THRR 40-80% of Max Heartrate 96-134    Ratings of Perceived Exertion 11-15    Perceived Dyspnea 0-4      Progression   Progression Continue to progress workloads to maintain intensity without signs/symptoms of physical distress.      Resistance Training   Training Prescription Yes    Weight 4lb    Reps 10-15             Perform Capillary Blood Glucose checks as needed.  Exercise Prescription Changes:   Exercise Prescription Changes     Row Name 05/26/23 1500 06/17/23 1600 06/29/23 1500 07/15/23 1700 07/21/23 1500      Response to Exercise   Blood Pressure (Admit) 102/58 108/60 100/62 118/62 --   Blood Pressure (Exercise) 122/54 142/64 144/62 156/70 --   Blood Pressure (Exit) 102/58 106/58 118/64 106/64 --   Heart Rate (Admit) 59 bpm 74 bpm 65 bpm 75 bpm --   Heart Rate (Exercise) 92 bpm 126 bpm 119 bpm 129 bpm --   Heart Rate (Exit) 74 bpm 90 bpm 71 bpm 89 bpm --   Oxygen Saturation (Admit) 99 % -- -- -- --   Oxygen Saturation (Exercise) 100 % -- -- -- --   Oxygen Saturation (Exit) 100 % -- -- -- --   Rating of Perceived Exertion (Exercise) 11 14 13 15  --   Perceived Dyspnea (Exercise) 0 -- -- -- --   Symptoms none -- none none --   Comments results First two weeks of exercise -- -- --   Duration -- Continue with 30 min of aerobic exercise without signs/symptoms of physical distress. Continue with 30 min of aerobic exercise without signs/symptoms of physical distress. Continue with 30 min of aerobic exercise without signs/symptoms of physical distress. Continue with 30 min of aerobic exercise without signs/symptoms of physical distress.   Intensity -- THRR unchanged THRR unchanged THRR unchanged THRR unchanged     Progression   Progression Continue to progress workloads to maintain intensity without signs/symptoms of physical distress. Continue to progress workloads to maintain intensity without signs/symptoms of physical distress. Continue to progress workloads to maintain intensity without signs/symptoms of physical distress. Continue to progress workloads to maintain intensity without signs/symptoms of physical distress. Continue to progress workloads to maintain intensity without signs/symptoms of physical distress.  Average METs -- 3.26 3.56 4 4     Resistance Training   Training Prescription -- Yes Yes Yes Yes   Weight -- 3 lb 3 lb 3 lb 3 lb   Reps -- 10-15 10-15 10-15 10-15     Interval Training   Interval Training -- No No No No     Treadmill   MPH -- 3.1 3.1 3.2 3.2   Grade -- 1  1.5 1.5 1.5   Minutes -- 15 15 15 15    METs -- 3.8 4.01 4.11 4.11     NuStep   Level -- 6 -- 5 5   Minutes -- 15 -- 15 15   METs -- 3.9 -- 3.6 3.6     REL-XR   Level -- -- 3 4 4    Minutes -- -- 15 15 15    METs -- -- 3.3 -- --     T5 Nustep   Level -- 5  T6 Nustep -- 5  T6 5  T6   Minutes -- 15 -- 15 15   METs -- 3 -- 3.8 3.8     Biostep-RELP   Level -- 2 3 5 5    Minutes -- 15 15 15 15    METs -- 3.53 3 4 4      Home Exercise Plan   Plans to continue exercise at -- -- -- -- Home (comment)  walk, weights, and look into a silver sneakers membership   Frequency -- -- -- -- Add 2 additional days to program exercise sessions.   Initial Home Exercises Provided -- -- -- -- 07/21/23     Oxygen   Maintain Oxygen Saturation -- 88% or higher 88% or higher 88% or higher 88% or higher    Row Name 07/29/23 1400             Response to Exercise   Blood Pressure (Admit) 98/62       Blood Pressure (Exit) 118/66       Heart Rate (Admit) 67 bpm       Heart Rate (Exercise) 134 bpm       Heart Rate (Exit) 89 bpm       Rating of Perceived Exertion (Exercise) 15       Perceived Dyspnea (Exercise) 0       Symptoms none       Duration Continue with 30 min of aerobic exercise without signs/symptoms of physical distress.       Intensity THRR unchanged         Progression   Progression Continue to progress workloads to maintain intensity without signs/symptoms of physical distress.       Average METs 4.6         Resistance Training   Training Prescription Yes       Weight 8 lb       Reps 10-15         Interval Training   Interval Training No         Treadmill   MPH 3.3       Grade 3       Minutes 15       METs 4.89         NuStep   Level 6       Minutes 15       METs 6.2         REL-XR   Level 8       Minutes 15  METs 4.5         T5 Nustep   Level 6  T6       Minutes 15       METs 4.1         Home Exercise Plan   Plans to continue exercise at Home (comment)   walk, weights, and look into a silver sneakers membership       Frequency Add 2 additional days to program exercise sessions.       Initial Home Exercises Provided 07/21/23         Oxygen   Maintain Oxygen Saturation 88% or higher                Exercise Comments:   Exercise Comments     Row Name 05/31/23 1542           Exercise Comments First full day of exercise!  Patient was oriented to gym and equipment including functions, settings, policies, and procedures.  Patient's individual exercise prescription and treatment plan were reviewed.  All starting workloads were established based on the results of the 6 minute walk test done at initial orientation visit.  The plan for exercise progression was also introduced and progression will be customized based on patient's performance and goals.'                Exercise Goals and Review:   Exercise Goals     Row Name 05/26/23 1601             Exercise Goals   Increase Physical Activity Yes       Intervention Develop an individualized exercise prescription for aerobic and resistive training based on initial evaluation findings, risk stratification, comorbidities and participant's personal goals.;Provide advice, education, support and counseling about physical activity/exercise needs.       Expected Outcomes Short Term: Attend rehab on a regular basis to increase amount of physical activity.;Long Term: Add in home exercise to make exercise part of routine and to increase amount of physical activity.;Long Term: Exercising regularly at least 3-5 days a week.       Increase Strength and Stamina Yes       Expected Outcomes Short Term: Increase workloads from initial exercise prescription for resistance, speed, and METs.;Short Term: Perform resistance training exercises routinely during rehab and add in resistance training at home;Long Term: Improve cardiorespiratory fitness, muscular endurance and strength as measured by increased  METs and functional capacity ( )       Able to understand and use rate of perceived exertion (RPE) scale Yes       Intervention Provide education and explanation on how to use RPE scale       Expected Outcomes Short Term: Able to use RPE daily in rehab to express subjective intensity level;Long Term:  Able to use RPE to guide intensity level when exercising independently       Able to understand and use Dyspnea scale Yes       Intervention Provide education and explanation on how to use Dyspnea scale       Expected Outcomes Short Term: Able to use Dyspnea scale daily in rehab to express subjective sense of shortness of breath during exertion;Long Term: Able to use Dyspnea scale to guide intensity level when exercising independently       Knowledge and understanding of Target Heart Rate Range (THRR) Yes       Intervention Provide education and explanation of THRR including how the numbers were predicted and  where they are located for reference       Expected Outcomes Short Term: Able to state/look up THRR;Short Term: Able to use daily as guideline for intensity in rehab;Long Term: Able to use THRR to govern intensity when exercising independently       Able to check pulse independently Yes       Intervention Provide education and demonstration on how to check pulse in carotid and radial arteries.;Review the importance of being able to check your own pulse for safety during independent exercise       Expected Outcomes Short Term: Able to explain why pulse checking is important during independent exercise;Long Term: Able to check pulse independently and accurately       Understanding of Exercise Prescription Yes       Intervention Provide education, explanation, and written materials on patient's individual exercise prescription       Expected Outcomes Short Term: Able to explain program exercise prescription;Long Term: Able to explain home exercise prescription to exercise independently                 Exercise Goals Re-Evaluation :  Exercise Goals Re-Evaluation     Row Name 05/31/23 1542 06/17/23 1628 06/29/23 1520 07/15/23 1740 07/21/23 1555     Exercise Goal Re-Evaluation   Exercise Goals Review Increase Physical Activity;Able to understand and use rate of perceived exertion (RPE) scale;Knowledge and understanding of Target Heart Rate Range (THRR);Understanding of Exercise Prescription;Increase Strength and Stamina;Able to check pulse independently Increase Physical Activity;Increase Strength and Stamina;Understanding of Exercise Prescription Increase Physical Activity;Increase Strength and Stamina;Understanding of Exercise Prescription Increase Physical Activity;Increase Strength and Stamina;Understanding of Exercise Prescription Increase Physical Activity;Able to understand and use rate of perceived exertion (RPE) scale;Knowledge and understanding of Target Heart Rate Range (THRR);Understanding of Exercise Prescription;Increase Strength and Stamina;Able to understand and use Dyspnea scale;Able to check pulse independently   Comments Reviewed RPE  and dyspnea scale, THR and program prescription with pt today.  Pt voiced understanding and was given a copy of goals to take home. Ahmad is off to a good start in the program. He was able to increase his treadmill workload up to a speed of 3.1 mph and an incline of 1%. He also improved to level 6 on the T4 nustep and level 5 on the T6 nustep. We will continue to monitor his progress in the program. Wyn is doing well in rehab. He increased his treadmill workload by increasing his incline to 1.5% while maintaining a speed of 3.1 mph. He also improved to level 3 on the biostep and XR. We will continue to monitor his progress in the program. Vedansh continues to make improvements in rehab. He recently increased his level on the XR from 3 to 4. He also increased his level on the Biostep from 3 to 5. We will continue to monitor his progress in the  program. Reviewed home exercise with pt today.  Pt plans to walk, use weights and look into a silver sneakers membership for exercise.  Reviewed THR, pulse, RPE, sign and symptoms, pulse oximetery and when to call 911 or MD.  Also discussed weather considerations and indoor options.  Pt voiced understanding.   Expected Outcomes Short: Use RPE daily to regulate intensity.  Long: Follow program prescription in THR. Short: Continue to progressively increase treadmill workload. Long: Continue exercise to improve strength and stamina. Short: Continue to progressively increase treadmill workload. Long: Continue exercise to improve strength and stamina. Short: Continue to progressively increase  treadmill workload. Long: Continue exercise to improve strength and stamina. Short: add1-2 days a week of exercise at home on off days of rehab. Long: become independent with exercise routine.    Row Name 07/29/23 1419             Exercise Goal Re-Evaluation   Exercise Goals Review Increase Physical Activity;Increase Strength and Stamina;Understanding of Exercise Prescription       Comments Travarious continues to make improvements in rehab. He has recently increased his incline on the treadmill from 1.5% to 3%. He was also able to increase his level on the XR from level 4 to level 8. We will continue to monitor his progress in the program.       Expected Outcomes Short: Continue to increase workloads on the treadmill when comfortable. Long: Continue to exercise to improve strength and stamina.                Discharge Exercise Prescription (Final Exercise Prescription Changes):  Exercise Prescription Changes - 07/29/23 1400       Response to Exercise   Blood Pressure (Admit) 98/62    Blood Pressure (Exit) 118/66    Heart Rate (Admit) 67 bpm    Heart Rate (Exercise) 134 bpm    Heart Rate (Exit) 89 bpm    Rating of Perceived Exertion (Exercise) 15    Perceived Dyspnea (Exercise) 0    Symptoms none     Duration Continue with 30 min of aerobic exercise without signs/symptoms of physical distress.    Intensity THRR unchanged      Progression   Progression Continue to progress workloads to maintain intensity without signs/symptoms of physical distress.    Average METs 4.6      Resistance Training   Training Prescription Yes    Weight 8 lb    Reps 10-15      Interval Training   Interval Training No      Treadmill   MPH 3.3    Grade 3    Minutes 15    METs 4.89      NuStep   Level 6    Minutes 15    METs 6.2      REL-XR   Level 8    Minutes 15    METs 4.5      T5 Nustep   Level 6   T6   Minutes 15    METs 4.1      Home Exercise Plan   Plans to continue exercise at Home (comment)   walk, weights, and look into a silver sneakers membership   Frequency Add 2 additional days to program exercise sessions.    Initial Home Exercises Provided 07/21/23      Oxygen   Maintain Oxygen Saturation 88% or higher             Nutrition:  Target Goals: Understanding of nutrition guidelines, daily intake of sodium 1500mg , cholesterol 200mg , calories 30% from fat and 7% or less from saturated fats, daily to have 5 or more servings of fruits and vegetables.  Education: All About Nutrition: -Group instruction provided by verbal, written material, interactive activities, discussions, models, and posters to present general guidelines for heart healthy nutrition including fat, fiber, MyPlate, the role of sodium in heart healthy nutrition, utilization of the nutrition label, and utilization of this knowledge for meal planning. Follow up email sent as well. Written material given at graduation. Flowsheet Row Cardiac Rehab from 08/11/2023 in [facility] Cardiac and Pulmonary Rehab  Date 06/16/23  Educator JG  Instruction Review Code 1- Verbalizes Understanding       Biometrics:  Pre Biometrics - 05/26/23 1549       Pre Biometrics   Height 6' 0.5" (1.842 m)    Weight 217 lb 14.4 oz  (98.8 kg)    Waist Circumference 43.4 inches    Hip Circumference 44.5 inches    Waist to Hip Ratio 0.98 %    BMI (Calculated) 29.13    Single Leg Stand 3.59 seconds             Post Biometrics - 08/05/23 1552        Post  Biometrics   Height 6' 0.5" (1.842 m)    Weight 209 lb (94.8 kg)    Waist Circumference 41 inches    Hip Circumference 43 inches    Waist to Hip Ratio 0.95 %    BMI (Calculated) 27.94    Single Leg Stand 30 seconds             Nutrition Therapy Plan and Nutrition Goals:  Nutrition Therapy & Goals - 05/26/23 1633       Nutrition Therapy   Diet Carb controlled, cardiac, low Na    Protein (specify units) 90    Fiber 30 grams    Whole Grain Foods 3 servings    Saturated Fats 15 max. grams    Fruits and Vegetables 5 servings/day    Sodium 2 grams      Personal Nutrition Goals   Nutrition Goal Eat a protein at ever meal    Personal Goal #2 Watch sodium intake, read labels of food ate often    Personal Goal #3 Watch portions and include veggies at larger meals    Comments Patient and wife present today, they have begun making changes likes choosing leaner meats, smaller portions, and watching sodium. He is drinking ~64oz of water daily. Some juice, cut out sweet tea. Spoke with them about sugary beverages and to work on reducing and cutting them out. Reviewed his A1C and carb sources, portions, and goal of 30-60g per meal. Went over [employer] handout, types of fats, sources, and how to read labels. Built out meals and snacks with foods he likes focusing on pairing a controlled amount of carbs with healthy protein and including veggies at larger meals.      Intervention Plan   Intervention Prescribe, educate and counsel regarding individualized specific dietary modifications aiming towards targeted core components such as weight, hypertension, lipid management, diabetes, heart failure and other comorbidities.;Nutrition handout(s) given to patient.     Expected Outcomes Short Term Goal: Understand basic principles of dietary content, such as calories, fat, sodium, cholesterol and nutrients.;Short Term Goal: A plan has been developed with personal nutrition goals set during dietitian appointment.;Long Term Goal: Adherence to prescribed nutrition plan.             Nutrition Assessments:  MEDIFICTS Score Key: >=70 Need to make dietary changes  40-70 Heart Healthy Diet <= 40 Therapeutic Level Cholesterol Diet  Flowsheet Row Cardiac Rehab from 08/09/2023 in [facility] Cardiac and Pulmonary Rehab  Picture Your Plate Total Score on Discharge 72      Picture Your Plate Scores: <40 Unhealthy dietary pattern with much room for improvement. 41-50 Dietary pattern unlikely to meet recommendations for good health and room for improvement. 51-60 More healthful dietary pattern, with some room for improvement.  >60 Healthy dietary pattern, although there may be some specific behaviors that  could be improved.    Nutrition Goals Re-Evaluation:  Nutrition Goals Re-Evaluation     Row Name 06/17/23 1614 07/29/23 1612           Goals   Current Weight 216 lb (98 kg) 210 lb (95.3 kg)      Nutrition Goal Eat less sodium --      Comment Elmon states his cholesterol is down. He has been eating more protien and reading food labels. He has been trying to drink more water and less tea. He is trying to make healthy choices even though "it isn't fun." His wife helps him stay motivated. He has been incorporating changes slowly and can tell a difference in how he feels. He wants to get around 200lbs.      Expected Outcome Short: keep eating a lower sodium diet. Long: adhere to a heart healthy diet. Short: make low sodium choices and manage serving sizes Long: independently manage a heart healthy diet and reach goal weight               Nutrition Goals Discharge (Final Nutrition Goals Re-Evaluation):  Nutrition Goals Re-Evaluation - 07/29/23 1612        Goals   Current Weight 210 lb (95.3 kg)    Comment He has been trying to drink more water and less tea. He is trying to make healthy choices even though "it isn't fun." His wife helps him stay motivated. He has been incorporating changes slowly and can tell a difference in how he feels. He wants to get around 200lbs.    Expected Outcome Short: make low sodium choices and manage serving sizes Long: independently manage a heart healthy diet and reach goal weight             Psychosocial: Target Goals: Acknowledge presence or absence of significant depression and/or stress, maximize coping skills, provide positive support system. Participant is able to verbalize types and ability to use techniques and skills needed for reducing stress and depression.   Education: Stress, Anxiety, and Depression - Group verbal and visual presentation to define topics covered.  Reviews how body is impacted by stress, anxiety, and depression.  Also discusses healthy ways to reduce stress and to treat/manage anxiety and depression.  Written material given at graduation. Flowsheet Row Cardiac Rehab from 08/11/2023 in [facility] Cardiac and Pulmonary Rehab  Date 07/21/23  Educator SB  Instruction Review Code 1- [employer] Understanding       Education: Sleep Hygiene -Provides group verbal and written instruction about how sleep can affect your health.  Define sleep hygiene, discuss sleep cycles and impact of sleep habits. Review good sleep hygiene tips.    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 Nothing stems his anxiety and is there. He stresses about alot of little things and life situations. His wife is a good support system for it.      Family Dynamics   Good Support System?  Yes      Barriers   Psychosocial barriers to participate in program The patient should benefit from training in stress management and relaxation.;There are no identifiable barriers or psychosocial needs.      Screening Interventions   Interventions Encouraged to exercise;To provide support and resources with identified psychosocial needs;Provide feedback about  the scores to participant    Expected Outcomes Short Term goal: Utilizing psychosocial counselor, staff and physician to assist with identification of specific Stressors or current issues interfering with healing process. Setting desired goal for each stressor or current issue identified.;Long Term Goal: Stressors or current issues are controlled or eliminated.;Short Term goal: Identification and review with participant of any Quality of Life or Depression concerns found by scoring the questionnaire.;Long Term goal: The participant improves quality of Life and PHQ9 Scores as seen by post scores and/or verbalization of changes             Quality of Life Scores:   Quality of Life - 08/09/23 1552       Quality of Life   Select Quality of Life      Quality of Life Scores   Health/Function Pre 25.1 %    Health/Function Post 24.8 %    Health/Function % Change -1.2 %    Socioeconomic Pre 20.13 %    Socioeconomic Post 23.14 %    Socioeconomic % Change  14.95 %    Psych/Spiritual Pre 22.71 %    Psych/Spiritual Post 21.43 %    Psych/Spiritual % Change -5.64 %    Family Pre 25.2 %    Family Post 27.6 %    Family % Change 9.52 %    GLOBAL Pre 23.5 %    GLOBAL Post 24.18 %    GLOBAL % Change 2.89 %            Scores of 19 and below usually indicate a poorer quality of life in these areas.  A difference of  2-3 points is a clinically meaningful difference.  A difference of 2-3 points in the total score of the Quality of Life Index has been associated with significant improvement in overall quality of life, self-image, physical  symptoms, and general health in studies assessing change in quality of life.  PHQ-9: Review Flowsheet       08/09/2023 05/26/2023  Depression screen PHQ 2/9  Decreased Interest - 0  Down, Depressed, Hopeless 0 1  PHQ - 2 Score 0 1  Altered sleeping 0 1  Tired, decreased energy 0 1  Change in appetite 0 0  Feeling bad or failure about yourself  0 0  Trouble concentrating 0 0  Moving slowly or fidgety/restless 0 1  Suicidal thoughts 0 0  PHQ-9 Score 0 4  Difficult doing work/chores - Not difficult at all    Details           Interpretation of Total Score  Total Score Depression Severity:  1-4 = Minimal depression, 5-9 = Mild depression, 10-14 = Moderate depression, 15-19 = Moderately severe depression, 20-27 = Severe depression   Psychosocial Evaluation and Intervention:  Psychosocial Evaluation - 05/20/23 1556       Psychosocial Evaluation & Interventions   Interventions Encouraged to exercise with the program and follow exercise prescription;Relaxation education;Stress management education    Comments 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 Nothing stems his anxiety and is there. He stresses about alot of little things and life situations. His wife is a good support system for it.    Expected Outcomes Short: Start [facility] to help with mood. Long: Maintain a healthy mental state    Continue Psychosocial Services  Follow up required by staff             Psychosocial Re-Evaluation:  Psychosocial Re-Evaluation     Row Name 06/17/23  1610 07/29/23 1602           Psychosocial Re-Evaluation   Current issues with Current Anxiety/Panic Current Stress Concerns;Current Anxiety/Panic      Comments Patient reports no issues with their current mental states, sleep, stress, depression or anxiety. Will follow up with patient in a few weeks for any changes. Sokha reports sometimes he feels stressed while driving sometimes. He is stressed and anxious some  about his appointment coming up to discuss his ICD. He feels like he is managing his anxiety better since starting the program with exercise and his medication as needed. He talked with a financial planner today about retiring and plans to do so within the next year.      Expected Outcomes Short: Continue to exercise regularly to support mental health and notify staff of any changes. Long: maintain mental health and well being through teaching of rehab or prescribed medications independently. Short: continue to take his medication and exercise, take time to enjoy hobbies (mowing, fishing) Long: indepdently exercise and manage positive healthy habits      Interventions Encouraged to attend Cardiac Rehabilitation for the exercise Encouraged to attend Cardiac Rehabilitation for the exercise      Continue Psychosocial Services  Follow up required by staff No Follow up required               Psychosocial Discharge (Final Psychosocial Re-Evaluation):  Psychosocial Re-Evaluation - 07/29/23 1602       Psychosocial Re-Evaluation   Current issues with Current Stress Concerns;Current Anxiety/Panic    Comments Gelacio reports sometimes he feels stressed while driving sometimes. He is stressed and anxious some about his appointment coming up to discuss his ICD. He feels like he is managing his anxiety better since starting the program with exercise and his medication as needed. He talked with a financial planner today about retiring and plans to do so within the next year.    Expected Outcomes Short: continue to take his medication and exercise, take time to enjoy hobbies (mowing, fishing) Long: indepdently exercise and manage positive healthy habits    Interventions Encouraged to attend Cardiac Rehabilitation for the exercise    Continue Psychosocial Services  No Follow up required             Vocational Rehabilitation: Provide vocational rehab assistance to qualifying candidates.   Vocational  Rehab Evaluation & Intervention:   Education: Education Goals: Education classes will be provided on a variety of topics geared toward better understanding of heart health and risk factor modification. Participant will state understanding/return demonstration of topics presented as noted by education test scores.  Learning Barriers/Preferences:  Learning Barriers/Preferences - 05/20/23 1554       Learning Barriers/Preferences   Learning Barriers None    Learning Preferences None             General Cardiac Education Topics:  AED/CPR: - Group verbal and written instruction with the use of models to demonstrate the basic use of the AED with the basic ABC's of resuscitation.   Anatomy and Cardiac Procedures: - Group verbal and visual presentation and models provide information about basic cardiac anatomy and function. Reviews the testing methods done to diagnose heart disease and the outcomes of the test results. Describes the treatment choices: Medical Management, Angioplasty, or Coronary Bypass Surgery for treating various heart conditions including Myocardial Infarction, Angina, Valve Disease, and Cardiac Arrhythmias.  Written material given at graduation.   Medication Safety: - Group verbal and visual instruction to  review commonly prescribed medications for heart and lung disease. Reviews the medication, class of the drug, and side effects. Includes the steps to properly store meds and maintain the prescription regimen.  Written material given at graduation.   Intimacy: - Group verbal instruction through game format to discuss how heart and lung disease can affect sexual intimacy. Written material given at graduation.. Flowsheet Row Cardiac Rehab from 08/11/2023 in [facility] Cardiac and Pulmonary Rehab  Date 08/11/23  Educator MB  Instruction Review Code 1- Verbalizes Understanding       Know Your Numbers and Heart Failure: - Group verbal and visual instruction to discuss  disease risk factors for cardiac and pulmonary disease and treatment options.  Reviews associated critical values for Overweight/Obesity, Hypertension, Cholesterol, and Diabetes.  Discusses basics of heart failure: signs/symptoms and treatments.  Introduces Heart Failure Zone chart for action plan for heart failure.  Written material given at graduation. Flowsheet Row Cardiac Rehab from 08/11/2023 in [facility] Cardiac and Pulmonary Rehab  Date 07/14/23  Educator SB  Instruction Review Code 1- Verbalizes Understanding       Infection Prevention: - Provides verbal and written material to individual with discussion of infection control including proper hand washing and proper equipment cleaning during exercise session. Flowsheet Row Cardiac Rehab from 08/11/2023 in [facility] Cardiac and Pulmonary Rehab  Date 05/26/23  Educator [facility]  Instruction Review Code 1- Verbalizes Understanding       Falls Prevention: - Provides verbal and written material to individual with discussion of falls prevention and safety. Flowsheet Row Cardiac Rehab from 08/11/2023 in [facility] Cardiac and Pulmonary Rehab  Date 05/26/23  Educator [facility]  Instruction Review Code 1- Verbalizes Understanding       Other: -Provides group and verbal instruction on various topics (see comments)   Knowledge Questionnaire Score:  Knowledge Questionnaire Score - 08/09/23 1552       Knowledge Questionnaire Score   Post Score 25/26             Core Components/Risk Factors/Patient Goals at Admission:  Personal Goals and Risk Factors at Admission - 05/26/23 1559       Core Components/Risk Factors/Patient Goals on Admission    Weight Management Yes;Weight Loss    Intervention Weight Management: Develop a combined nutrition and exercise program designed to reach desired caloric intake, while maintaining appropriate intake of nutrient and fiber, sodium and fats, and appropriate energy expenditure required for the weight goal.;Weight  Management: Provide education and appropriate resources to help participant work on and attain dietary goals.;Weight Management/Obesity: Establish reasonable short term and long term weight goals.;Obesity: Provide education and appropriate resources to help participant work on and attain dietary goals.    Admit Weight 217 lb 14.4 oz (98.8 kg)    Goal Weight: Short Term 210 lb (95.3 kg)    Goal Weight: Long Term 200 lb (90.7 kg)    Expected Outcomes Short Term: Continue to assess and modify interventions until short term weight is achieved;Long Term: Adherence to nutrition and physical activity/exercise program aimed toward attainment of established weight goal;Weight Loss: Understanding of general recommendations for a balanced deficit meal plan, which promotes 1-2 lb weight loss per week and includes a negative energy balance of [phone] kcal/d;Understanding recommendations for meals to include 15-35% energy as protein, 25-35% energy from fat, 35-60% energy from carbohydrates, less than 200mg  of dietary cholesterol, 20-35 gm of total fiber daily;Understanding of distribution of calorie intake throughout the day with the consumption of 4-5 meals/snacks    Heart  Failure Yes    Intervention Provide a combined exercise and nutrition program that is supplemented with education, support and counseling about heart failure. Directed toward relieving symptoms such as shortness of breath, decreased exercise tolerance, and extremity edema.    Expected Outcomes Improve functional capacity of life;Short term: Attendance in program 2-3 days a week with increased exercise capacity. Reported lower sodium intake. Reported increased fruit and vegetable intake. Reports medication compliance.;Short term: Daily weights obtained and reported for increase. Utilizing diuretic protocols set by physician.;Long term: Adoption of self-care skills and reduction of barriers for early signs and symptoms recognition and intervention  leading to self-care maintenance.    Hypertension Yes    Intervention Provide education on lifestyle modifcations including regular physical activity/exercise, weight management, moderate sodium restriction and increased consumption of fresh fruit, vegetables, and low fat dairy, alcohol moderation, and smoking cessation.;Monitor prescription use compliance.    Expected Outcomes Short Term: Continued assessment and intervention until BP is < 140/27mm HG in hypertensive participants. < 130/31mm HG in hypertensive participants with diabetes, heart failure or chronic kidney disease.;Long Term: Maintenance of blood pressure at goal levels.    Lipids Yes    Intervention Provide education and support for participant on nutrition & aerobic/resistive exercise along with prescribed medications to achieve LDL 70mg , HDL >40mg .    Expected Outcomes Short Term: Participant states understanding of desired cholesterol values and is compliant with medications prescribed. Participant is following exercise prescription and nutrition guidelines.;Long Term: Cholesterol controlled with medications as prescribed, with individualized exercise RX and with personalized nutrition plan. Value goals: LDL < 70mg , HDL > 40 mg.             Education:Diabetes - Individual verbal and written instruction to review signs/symptoms of diabetes, desired ranges of glucose level fasting, after meals and with exercise. Acknowledge that pre and post exercise glucose checks will be done for 3 sessions at entry of program.   Core Components/Risk Factors/Patient Goals Review:   Goals and Risk Factor Review     Row Name 06/17/23 1616 07/29/23 1555           Core Components/Risk Factors/Patient Goals Review   Personal Goals Review Weight Management/Obesity Heart Failure;Weight Management/Obesity;Hypertension      Review Nicholis wants to lose some weight. He has made some diet changes and is focusing on eating more fruit and vegetables.  He has also cut back on the sweets. Excell has been enjoying the program. He has been adhering to weighing himself every day. His weight fluctuates some, but he is staying on top of it. He is working on understanding his heart failure diagnosis better. Staff gave him a heart failure education packet and discussed it with him. His next doctor's appointment is in November to discuss an ICD placement and if its the best option for him. His blood pressure has been lower with the medication and we discussed the need to check it.      Expected Outcomes Short: lose a few pounds in the next couple weeks. Long: reach weight goal of 200. Short: buy a blood pressure cuff and read heart failure packet. Long: independently manage heart failure symptoms, blood pressure and weight adherence               Core Components/Risk Factors/Patient Goals at Discharge (Final Review):   Goals and Risk Factor Review - 07/29/23 1555       Core Components/Risk Factors/Patient Goals Review   Personal Goals Review Heart Failure;Weight Management/Obesity;Hypertension  Review Yuya has been enjoying the program. He has been adhering to weighing himself every day. His weight fluctuates some, but he is staying on top of it. He is working on understanding his heart failure diagnosis better. Staff gave him a heart failure education packet and discussed it with him. His next doctor's appointment is in November to discuss an ICD placement and if its the best option for him. His blood pressure has been lower with the medication and we discussed the need to check it.    Expected Outcomes Short: buy a blood pressure cuff and read heart failure packet. Long: independently manage heart failure symptoms, blood pressure and weight adherence             ITP Comments:  ITP Comments     Row Name 05/20/23 1553 05/26/23 1555 05/31/23 1542 06/16/23 0934 07/14/23 1000   ITP Comments Virtual Visit completed. Patient informed on EP and RD  appointment and 6 Minute walk test. Patient also informed of patient health questionnaires on My Chart. Patient Verbalizes understanding. Visit diagnosis can be found in [facility] 05/08/2023. Completed and gym orientation. Initial ITP created and sent for review to Dr. Bethann Punches, Medical Director. First full day of exercise!  Patient was oriented to gym and equipment including functions, settings, policies, and procedures.  Patient's individual exercise prescription and treatment plan were reviewed.  All starting workloads were established based on the results of the 6 minute walk test done at initial orientation visit.  The plan for exercise progression was also introduced and progression will be customized based on patient's performance and goals.' 30 Day review completed. Medical Director ITP review done, changes made as directed, and signed approval by Medical Director.     new to program 30 Day review completed. Medical Director ITP review done, changes made as directed, and signed approval by Medical Director.    Row Name 08/11/23 0841           ITP Comments 30 Day review completed. Medical Director ITP review done, changes made as directed, and signed approval by Medical Director.                Comments: Discharge ITP

## 2023-08-24 NOTE — Progress Notes (Unsigned)
Electrophysiology Office Note:    Date:  08/25/2023   ID:  [name]., DOB 18-Jan-1956, MRN [id number]  [facility] Cardiologist:  Yvonne Kendall, MD  [facility] Electrophysiologist:  Lanier Prude, MD   Referring MD: Marianne Sofia, PA-C   Chief Complaint: HF  History of Present Illness:      Discussed the use of AI scribe software for clinical note transcription with the patient, who gave verbal consent to proceed.  History of Present Illness   Mr. Reichert, a 67 year old with a history of chronic systolic heart failure secondary to ischemic cardiomyopathy, coronary artery disease, hypertension, hyperlipidemia, prediabetes, anxiety, chronic pain, and vasovagal syncope, presents for evaluation of his heart condition. The patient underwent a heart catheterization in July 2024, which revealed severe single-vessel coronary artery disease with a thrombotic occlusion of the proximal LAD. Following the procedure, the patient was started on dual antiplatelet therapy. A follow-up echo in September showed an ejection fraction of 30% and regional wall motion abnormalities consistent with his coronary artery disease history. The patient reports maintaining an active lifestyle, including regular exercise, and denies experiencing any chest pain or shortness of breath during these activities.               Their past medical, social and family history was reveiwed.   ROS:   Please see the history of present illness.    All other systems reviewed and are negative.  EKGs/Labs/Other Studies Reviewed:    The following studies were reviewed today:  07/01/2023 Echo shows EF 30% RV normal No significant valve abnormalities        Physical Exam:    VS:  BP 130/74 (BP Location: Left Arm, Patient Position: Sitting, Cuff Size: Normal)   Pulse (!) 51   Ht 6\' 1"  (1.854 m)   Wt 210 lb (95.3 kg)   SpO2 97%   BMI 27.71 kg/m     Wt Readings from Last 3 Encounters:   08/25/23 210 lb (95.3 kg)  08/05/23 209 lb (94.8 kg)  06/08/23 217 lb 12.8 oz (98.8 kg)     Physical Exam   GEN: No distress on table CARD: RRR, No MRG RESP: No IWOB. CTAB.           ASSESSMENT AND PLAN:    1. HFrEF (heart failure with reduced ejection fraction) (HCC)   2. Coronary artery disease involving native coronary artery of native heart without angina pectoris   3. Ischemic cardiomyopathy       Assessment and Plan    Ischemic Cardiomyopathy History of severe single vessel coronary artery disease with thrombotic occlusion of the proximal LAD, stented in July 2024. Ejection fraction of 30% on echo in September 2024. Discussed the role of ICD for primary prevention of sudden cardiac death in the context of ischemic cardiomyopathy and reduced ejection fraction. -Plan for Biotronic VDD ICD implantation if EF remains low on cMR -Continue dual antiplatelet therapy in the perioperative period. -Order cardiac MRI to reassess ejection fraction and look for myocardial scar tissue.  Chronic Systolic Heart Failure -Encourage continued exercise and healthy lifestyle habits.  Hypertension, Hyperlipidemia, Prediabetes, Anxiety, Chronic Pain, Vasovagal Syncope No changes or updates discussed in this visit. Continue current management as per primary care physician.          The patient has an ischemic CM (EF 30%), NYHA Class III CHF, and CAD.  He is referred by Dr End for risk stratification of sudden death and consideration of ICD  implantation.  At this time, he meets criteria for ICD implantation for primary prevention of sudden death.  I have had a thorough discussion with the patient reviewing options.  The patient and their family (if available) have had opportunities to ask questions and have them answered. The patient and I have decided together through a shared decision making process to proceed with ICD implant at this time.    Risks, benefits, alternatives to ICD  implantation were discussed in detail with the patient today. The patient understands that the risks include but are not limited to bleeding, infection, pneumothorax, perforation, tamponade, vascular damage, renal failure, MI, stroke, death, inappropriate shocks, and lead dislodgement and wishes to proceed.  We will therefore schedule device implantation at the next available time.      Signed, Rossie Muskrat. Lalla Brothers, MD, [facility], [facility] 08/25/2023 1:36 PM    Electrophysiology [facility]

## 2023-08-25 ENCOUNTER — Ambulatory Visit: Payer: Medicare Other | Attending: Cardiology | Admitting: Cardiology

## 2023-08-25 ENCOUNTER — Encounter: Payer: Self-pay | Admitting: Cardiology

## 2023-08-25 VITALS — BP 130/74 | HR 51 | Ht 73.0 in | Wt 210.0 lb

## 2023-08-25 DIAGNOSIS — I5020 Unspecified systolic (congestive) heart failure: Secondary | ICD-10-CM | POA: Diagnosis not present

## 2023-08-25 DIAGNOSIS — I2510 Atherosclerotic heart disease of native coronary artery without angina pectoris: Secondary | ICD-10-CM | POA: Diagnosis not present

## 2023-08-25 DIAGNOSIS — I255 Ischemic cardiomyopathy: Secondary | ICD-10-CM

## 2023-08-25 NOTE — Patient Instructions (Addendum)
Medication Instructions:  Your physician recommends that you continue on your current medications as directed. Please refer to the Current Medication list given to you today.  *If you need a refill on your cardiac medications before your next appointment, please call your pharmacy*  Lab Work: BMET and CBC prior to ICD implant  Testing/Procedures: Your physician has requested that you have a cardiac MRI. Cardiac MRI uses a computer to create images of your heart as its beating, producing both still and moving pictures of your heart and major blood vessels. For further information please visit [email or web address]. Please follow the instruction sheet given to you today for more information.  Your physician has recommended that you have a defibrillator inserted. An implantable cardioverter defibrillator (ICD) is a small device that is placed in your chest or, in rare cases, your abdomen. This device uses electrical pulses or shocks to help control life-threatening, irregular heartbeats that could lead the heart to suddenly stop beating (sudden cardiac arrest). Leads are attached to the ICD that goes into your heart. This is done in the hospital and usually requires an overnight stay. Please see the instruction sheet given to you today for more information.  Follow-Up: At [facility], you and your health needs are our priority.  As part of our continuing mission to provide you with exceptional heart care, we have created designated Provider Care Teams.  These Care Teams include your primary Cardiologist (physician) and Advanced Practice Providers (APPs -  Physician Assistants and Nurse Practitioners) who all work together to provide you with the care you need, when you need it.  Your next appointment:   We will call you to schedule your follow up appointments.     You will be scheduled for a Cardiac MRI at the location below.   [facility] [address] [place], [place] [address] [phone] Please go to the [facility] and check-in with the desk attendant.   Magnetic resonance imaging (MRI) is a painless test that produces images of the inside of the body without using Xrays.  During an MRI, strong magnets and radio waves work together in a Data processing manager to form detailed images.   MRI images may provide more details about a medical condition than X-rays, CT scans, and ultrasounds can provide.  You may be given earphones to listen for instructions.  You may eat a light breakfast and take medications as ordered with the exception of furosemide, hydrochlorothiazide, or spironolactone(fluid pill, other). If you are undergoing a stress MRI, please avoid stimulants for 12 hr prior to test. (Ie. Caffeine, nicotine, chocolate, or antihistamine medications)  An IV will be inserted into one of your veins. Contrast material will be injected into your IV. It will leave your body through your urine within a day. You may be told to drink plenty of fluids to help flush the contrast material out of your system.  You will be asked to remove all metal, including: Watch, jewelry, and other metal objects including hearing aids, hair pieces and dentures. Also wearable glucose monitoring systems (ie. Freestyle Libre and Omnipods) (Braces and fillings normally are not a problem.)   TEST WILL TAKE APPROXIMATELY 1 HOUR  PLEASE NOTIFY SCHEDULING AT LEAST 24 HOURS IN ADVANCE IF YOU ARE UNABLE TO KEEP YOUR APPOINTMENT. [phone]  For more information and frequently asked questions, please visit our website : [email or web address]  Please call the Cardiac Imaging Nurse Navigators with any questions/concerns. [phone] Office

## 2023-08-30 ENCOUNTER — Other Ambulatory Visit (HOSPITAL_COMMUNITY): Payer: Self-pay

## 2023-08-30 ENCOUNTER — Other Ambulatory Visit: Payer: Self-pay

## 2023-08-31 ENCOUNTER — Telehealth: Payer: Self-pay

## 2023-08-31 ENCOUNTER — Other Ambulatory Visit (HOSPITAL_BASED_OUTPATIENT_CLINIC_OR_DEPARTMENT_OTHER): Payer: Self-pay

## 2023-08-31 ENCOUNTER — Telehealth: Payer: Self-pay | Admitting: Cardiology

## 2023-08-31 NOTE — Telephone Encounter (Signed)
Spoke with the patient to discuss scheduling his ICD implant. He is scheduled for an MRI on 1/15 to reassess EF. He does not want to schedule an ICD implant at this time. He would like to wait until after his MRI to see if he actually needs an ICD.

## 2023-08-31 NOTE — Telephone Encounter (Signed)
Called patient to see if he was ready to schedule ICD implant. Left message for patient to call back.

## 2023-08-31 NOTE — Telephone Encounter (Signed)
Patient is returning phone call.  °

## 2023-09-23 ENCOUNTER — Ambulatory Visit: Payer: Medicare Other | Attending: Medical | Admitting: Medical

## 2023-09-23 ENCOUNTER — Encounter: Payer: Self-pay | Admitting: Medical

## 2023-09-23 ENCOUNTER — Other Ambulatory Visit: Payer: Self-pay

## 2023-09-23 VITALS — BP 118/60 | HR 59 | Ht 73.0 in | Wt 211.4 lb

## 2023-09-23 DIAGNOSIS — I2510 Atherosclerotic heart disease of native coronary artery without angina pectoris: Secondary | ICD-10-CM | POA: Diagnosis not present

## 2023-09-23 DIAGNOSIS — Z79899 Other long term (current) drug therapy: Secondary | ICD-10-CM

## 2023-09-23 DIAGNOSIS — I255 Ischemic cardiomyopathy: Secondary | ICD-10-CM | POA: Diagnosis not present

## 2023-09-23 DIAGNOSIS — E782 Mixed hyperlipidemia: Secondary | ICD-10-CM

## 2023-09-23 DIAGNOSIS — I5020 Unspecified systolic (congestive) heart failure: Secondary | ICD-10-CM | POA: Diagnosis not present

## 2023-09-23 DIAGNOSIS — I10 Essential (primary) hypertension: Secondary | ICD-10-CM

## 2023-09-23 MED ORDER — EMPAGLIFLOZIN 10 MG PO TABS: 10.0000 mg | ORAL_TABLET | Freq: Every day | ORAL | 0 refills | Status: DC

## 2023-09-23 MED ORDER — SPIRONOLACTONE 25 MG PO TABS: 12.5000 mg | ORAL_TABLET | Freq: Every day | ORAL | 3 refills | Status: DC

## 2023-09-23 NOTE — Progress Notes (Signed)
Cardiology Office Note:    Date:  09/23/2023   ID:  Connor Hubert., DOB Sep 18, 1956, MRN [id number]  PCP:  [facility]  [facility] Cardiologist:  Yvonne Kendall, MD  [facility] Electrophysiologist:  Lanier Prude, MD   Referring MD: [facility]*   Chief Complaint: 3 month follow-up  History of Present Illness:    Connor Guidice. is a 67 y.o. male with a hx of HTN, HLD, prediabetic, anxiety, chronic pain, vasovagal syncope, CAD s/p DES LAD, HFrEF, ICM, who presents for follow-up.    Patient was admitted 05/07/2021 with non-STEMI.  Troponins peaked at 4000.  Cardiac cath showed CTO of the LAD with subsequent DES to proximal/mid LAD. He had residual disease 20 to 30% ostial LAD and 30% mid left circumflex stenosis. He was started on DAPT with aspirin and ticagrelor for at least 12 months. Echo showed newly reduced pump function of 30% with normal RV function, hypokinesis of the mid to distal, inferior, inferoseptal, and apical walls. Patient had multiple episodes of relatively slow VT post revascularization.  Patient was started on Coreg, [place], Jardiance.  Repeat echocardiogram showed EF of 30 to 35%, grade 2 diastolic dysfunction, mild MR.  Patient was referred to EP to discuss ICD.  Cardiac MRI was ordered.  Today, the patient reports he is overall doing well. He is walking 40 minutes a few times a week. Plan for cMRI next month. BP is much better. He denies chest pain, SOB, LLE, orthopnea or pnd.   Past Medical History:  Diagnosis Date   Acute appendicitis 10/05/2013   Lap Appy 10/05/13    Anxiety    Chronic pain    Degenerative joint disease    Depression    GERD (gastroesophageal reflux disease)    Hyperlipidemia    Hypertension    Pneumonia     Past Surgical History:  Procedure Laterality Date   APPENDECTOMY     CORONARY STENT INTERVENTION N/A 05/01/2023   Procedure: CORONARY STENT INTERVENTION;  Surgeon: Yvonne Kendall, MD;  Location: [facility] INVASIVE CV LAB;  Service: Cardiovascular;  Laterality: N/A;   LAPAROSCOPIC APPENDECTOMY N/A 10/05/2013   Procedure: APPENDECTOMY LAPAROSCOPIC;  Surgeon: Valarie Merino, MD;  Location: [facility] ORS;  Service: General;  Laterality: N/A;   LEFT HEART CATH AND CORONARY ANGIOGRAPHY N/A 05/01/2023   Procedure: LEFT HEART CATH AND CORONARY ANGIOGRAPHY;  Surgeon: Yvonne Kendall, MD;  Location: [facility] INVASIVE CV LAB;  Service: Cardiovascular;  Laterality: N/A;    Current Medications: Current Meds  Medication Sig   ALPRAZolam (XANAX) 1 MG tablet Take 0.5 mg by mouth 3 (three) times daily as needed for sleep. For anxiety   aspirin EC 81 MG tablet Take 1 tablet (81 mg total) by mouth daily. Swallow whole.   carvedilol (COREG) 3.125 MG tablet Take 1 tablet (3.125 mg total) by mouth 2 (two) times daily with a meal.   loperamide (IMODIUM A-D) 2 MG tablet Take 2 mg by mouth 4 (four) times daily as needed for diarrhea or loose stools.   losartan (COZAAR) 25 MG tablet Take 0.5 tablets (12.5 mg total) by mouth daily.   nitroGLYCERIN (NITROSTAT) 0.4 MG SL tablet Place 1 tablet (0.4 mg total) under the tongue every 5 (five) minutes x 3 doses as needed for chest pain.   rosuvastatin (CRESTOR) 40 MG tablet Take 1 tablet (40 mg total) by mouth daily.   spironolactone (ALDACTONE) 25 MG tablet Take 0.5 tablets (12.5 mg total) by  mouth daily.   ticagrelor (BRILINTA) 90 MG TABS tablet Take 1 tablet (90 mg total) by mouth 2 (two) times daily.   [DISCONTINUED] empagliflozin (JARDIANCE) 10 MG TABS tablet Take 1 tablet (10 mg total) by mouth daily.     Allergies:   Penicillins, Penicillins, Sertraline hcl, Zoloft [sertraline], and Oxycodone   Social History   Socioeconomic History   Marital status: Married    Spouse name: Not on file   Number of children: Not on file   Years of education: Not on file   Highest education level: Not on file  Occupational History   Not on file  Tobacco Use    Smoking status: Former    Current packs/day: 0.00    Types: Cigarettes    Quit date: 10/12/2012    Years since quitting: 10.9   Smokeless tobacco: Never  Vaping Use   Vaping status: Never Used  Substance and Sexual Activity   Alcohol use: Not Currently    Comment: Nothing since 2014   Drug use: Yes    Types: Marijuana   Sexual activity: Not on file  Other Topics Concern   Not on file  Social History Narrative   ** Merged History Encounter **       Social Drivers of Health   Financial Resource Strain: Not on file  Food Insecurity: No Food Insecurity (05/02/2023)   Hunger Vital Sign    Worried About Running Out of Food in the Last Year: Never true    Ran Out of Food in the Last Year: Never true  Transportation Needs: No Transportation Needs (05/02/2023)   PRAPARE - Administrator, Civil Service (Medical): No    Lack of Transportation (Non-Medical): No  Physical Activity: Not on file  Stress: Not on file  Social Connections: Not on file     Family History: The patient's family history includes CAD in his father; Colon cancer in his maternal grandmother; Diverticulitis in his father; Heart disease (age of onset: 28) in his father; Stroke in his mother. There is no history of Colon polyps.  ROS:   Please see the history of present illness.     All other systems reviewed and are negative.  EKGs/Labs/Other Studies Reviewed:    The following studies were reviewed today:  Limited echo 06/2023 1. Left ventricular ejection fraction, by estimation, is 30 to 35%. Left  ventricular ejection fraction by PLAX is 34 %. The left ventricle has  moderately decreased function. The left ventricle demonstrates regional  wall motion abnormalities (severe mid  to distal anteroseptal/anterior and apical hypokinesis). Left ventricular  diastolic parameters are consistent with Grade II diastolic dysfunction  (pseudonormalization).   2. Right ventricular systolic function is  normal. The right ventricular  size is normal. Tricuspid regurgitation signal is inadequate for assessing  PA pressure.   3. The mitral valve is normal in structure. Mild mitral valve  regurgitation. No evidence of mitral stenosis.   4. The aortic valve is normal in structure. Aortic valve regurgitation is  not visualized. No aortic stenosis is present.   5. The inferior vena cava is normal in size with greater than 50%  respiratory variability, suggesting right atrial pressure of 3 mmHg.   Echo 04/2023  1. Extremely poor acoustic windows limit study, even with Definity use.  There is akinesis of the mid/distal inferior, inferoseptal and apical  walls. Distal anterior wall is hypokinetic. Left ventricular ejection  fraction, by estimation, is 30%. The left   ventricle  has severely decreased function. There is mild left ventricular  hypertrophy.   2. Right ventricular systolic function is normal. The right ventricular  size is normal.   3. Trivial mitral valve regurgitation.   4. The aortic valve is normal in structure. Aortic valve regurgitation is  not visualized.   5. The inferior vena cava is normal in size with greater than 50%  respiratory variability, suggesting right atrial pressure of 3 mmHg.   LHC 04/2023 Conclusions: Severe single-vessel coronary artery with thrombotic occlusion of proximal/mid LAD.  There are also 20-30% ostial LAD and 30% mid LCx stenoses.  No significant disease observed in nondominant RCA. Severely reduced left ventricular systolic function (LVEF 35-35% with mid/apical anterior and apical inferior akinesis). Mildly elevated left ventricular filling pressure (LVEDP 24 mmHg). Successful PCI to proximal/mid LAD using Synergy XD 3.0 x 28 mm drug-eluting stent with 0% residual stenosis and TIMI-2 flow in the distal LAD (suspect sluggish distal flow is due to microvascular dysfunction).   Recommendations: Admit to 2-Heart ICU. Continue cangrelor infusion for 2  hours. Dual antiplatelet therapy with aspirin and ticagrelor for at least 12 months. Gentle diuresis. Initiate goal-directed medical therapy for acute HFrEF due to ischemic cardiomyopathy tomorrow as heart rate, blood pressure, and renal function allow. Follow-up echocardiogram. Aggressive secondary prevention of coronary artery disease.   Yvonne Kendall, MD [facility]  EKG:  EKG is not ordered today.  Recent Labs: 05/03/2023: Magnesium 2.0 05/10/2023: BUN 21; Creatinine, Ser 1.26; Hemoglobin 15.2; Platelets 253; Potassium 4.1; Sodium 137 06/10/2023: ALT 42  Recent Lipid Panel    Component Value Date/Time   CHOL 97 06/10/2023 1021   TRIG 119 06/10/2023 1021   HDL 41 06/10/2023 1021   CHOLHDL 2.4 06/10/2023 1021   VLDL 24 06/10/2023 1021   LDLCALC 32 06/10/2023 1021   LDLDIRECT 149.4 06/26/2010 0000    Physical Exam:    VS:  BP 118/60   Pulse (!) 59   Ht 6\' 1"  (1.854 m)   Wt 211 lb 6.4 oz (95.9 kg)   SpO2 98%   BMI 27.89 kg/m     Wt Readings from Last 3 Encounters:  09/23/23 211 lb 6.4 oz (95.9 kg)  08/25/23 210 lb (95.3 kg)  08/05/23 209 lb (94.8 kg)     GEN:  Well nourished, well developed in no acute distress HEENT: Normal NECK: No JVD; No carotid bruits LYMPHATICS: No lymphadenopathy CARDIAC: RRR, no murmurs, rubs, gallops RESPIRATORY:  Clear to auscultation without rales, wheezing or rhonchi  ABDOMEN: Soft, non-tender, non-distended MUSCULOSKELETAL:  No edema; No deformity  SKIN: Warm and dry NEUROLOGIC:  Alert and oriented x 3 PSYCHIATRIC:  Normal affect   ASSESSMENT:    1. Medication management   2. Coronary artery disease involving native coronary artery of native heart without angina pectoris   3. HFrEF (heart failure with reduced ejection fraction) (HCC)   4. Ischemic cardiomyopathy   5. Essential hypertension   6. Hyperlipidemia, mixed    PLAN:    In order of problems listed above:  CAD s/p DES p-mLAD in July 2024 Patient denies anginal  symptoms. He is walking 40 minutes a couple times a week. Continue DAPT with Aspirin and Brilinta x 12 months. Continue Coreg, Jardiance, Crestor.  HFrEF ICM Echo during hospitalization was 30%. Repeat limited echo showed LVEF 30-35%. He was referred to EP, who ordered cMRI. This is scheduled for next month. Patient did not tolerate Entresto due to low BP. He is tolerating Coreg, Jardiance and  Losartan. I will add on spironolactone 12.5mg  daily, BMET in 2 weeks.   HTN BP is improved. Start spironolactone as above. Continue other medications.   HLD LDL 32. Continue Crestor 40mg  daily.   Disposition: Follow up in 3 month(s) with MD/APP    Signed, Ayomide Zuleta David Stall, PA-C  09/23/2023 2:07 PM    [facility]

## 2023-09-23 NOTE — Patient Instructions (Signed)
Medication Instructions:   Spironolactone 12.5 MG daily.   *If you need a refill on your cardiac medications before your next appointment, please call your pharmacy*   Lab Work: Your provider would like for you to return in 2 weeks to have the following labs drawn: BMP.   Please go to [facility] [address] [address], [place] [address] You do not need an appointment.  They are open from 7:30 am-4 pm.  Lunch from 1:00 pm- 2:00 pm You will not need to be fasting.       If you have labs (blood work) drawn today and your tests are completely normal, you will receive your results only by: MyChart Message (if you have MyChart) OR A paper copy in the mail If you have any lab test that is abnormal or we need to change your treatment, we will call you to review the results.   Testing/Procedures: None ordered.    Follow-Up: At [facility], you and your health needs are our priority.  As part of our continuing mission to provide you with exceptional heart care, we have created designated Provider Care Teams.  These Care Teams include your primary Cardiologist (physician) and Advanced Practice Providers (APPs -  Physician Assistants and Nurse Practitioners) who all work together to provide you with the care you need, when you need it.  We recommend signing up for the patient portal called "MyChart".  Sign up information is provided on this After Visit Summary.  MyChart is used to connect with patients for Virtual Visits (Telemedicine).  Patients are able to view lab/test results, encounter notes, upcoming appointments, etc.  Non-urgent messages can be sent to your provider as well.   To learn more about what you can do with MyChart, go to [email or web address].    Your next appointment:   3 month(s)  Provider:   You may see Yvonne Kendall, MD or one of the following Advanced Practice Providers on your designated Care Team:   Nicolasa Ducking, NP Eula Listen, PA-C Cadence Fransico Michael, PA-C Charlsie Quest, NP Carlos Levering, NP

## 2023-10-15 ENCOUNTER — Other Ambulatory Visit
Admission: RE | Admit: 2023-10-15 | Discharge: 2023-10-15 | Disposition: A | Discharge status: Home / Self Care | Payer: Medicare Other | Source: Ambulatory Visit | Attending: Medical | Admitting: Medical

## 2023-10-15 DIAGNOSIS — Z79899 Other long term (current) drug therapy: Secondary | ICD-10-CM | POA: Insufficient documentation

## 2023-10-15 DIAGNOSIS — I5020 Unspecified systolic (congestive) heart failure: Secondary | ICD-10-CM | POA: Insufficient documentation

## 2023-10-15 DIAGNOSIS — I2510 Atherosclerotic heart disease of native coronary artery without angina pectoris: Secondary | ICD-10-CM | POA: Diagnosis not present

## 2023-10-15 DIAGNOSIS — I255 Ischemic cardiomyopathy: Secondary | ICD-10-CM | POA: Diagnosis not present

## 2023-10-15 LAB — BASIC METABOLIC PANEL
Anion gap: 11 (ref 5–15)
BUN: 21 mg/dL (ref 8–23)
CO2: 21 mmol/L — ABNORMAL LOW (ref 22–32)
Calcium: 8.8 mg/dL — ABNORMAL LOW (ref 8.9–10.3)
Chloride: 105 mmol/L (ref 98–111)
Creatinine, Ser: 1.03 mg/dL (ref 0.61–1.24)
GFR, Estimated: >60 mL/min (ref >60)
Glucose, Bld: 99 mg/dL (ref 70–99)
Potassium: 4.2 mmol/L (ref 3.5–5.1)
Sodium: 137 mmol/L (ref 135–145)

## 2023-10-25 ENCOUNTER — Encounter (HOSPITAL_COMMUNITY): Payer: Self-pay

## 2023-10-26 ENCOUNTER — Other Ambulatory Visit (HOSPITAL_COMMUNITY): Payer: Self-pay

## 2023-10-26 ENCOUNTER — Other Ambulatory Visit: Payer: Self-pay

## 2023-10-27 ENCOUNTER — Ambulatory Visit: Admission: RE | Admit: 2023-10-27 | Payer: Medicare Other | Source: Ambulatory Visit

## 2023-11-29 ENCOUNTER — Telehealth: Payer: Self-pay | Admitting: *Deleted

## 2023-11-29 NOTE — Telephone Encounter (Signed)
Applications for Brilinta and jardiance are pending provider signature and will then fax to company.

## 2023-11-29 NOTE — Telephone Encounter (Signed)
Applications have been done and faxed to companies for both [place].

## 2023-12-13 ENCOUNTER — Other Ambulatory Visit (HOSPITAL_COMMUNITY): Payer: Self-pay

## 2023-12-22 ENCOUNTER — Other Ambulatory Visit: Payer: Self-pay | Admitting: Cardiology

## 2023-12-22 ENCOUNTER — Ambulatory Visit
Admission: RE | Admit: 2023-12-22 | Discharge: 2023-12-22 | Disposition: A | Discharge status: Home / Self Care | Payer: Medicare Other | Source: Ambulatory Visit | Attending: Cardiology | Admitting: Cardiology

## 2023-12-22 DIAGNOSIS — I255 Ischemic cardiomyopathy: Secondary | ICD-10-CM

## 2023-12-22 DIAGNOSIS — I2510 Atherosclerotic heart disease of native coronary artery without angina pectoris: Secondary | ICD-10-CM

## 2023-12-22 DIAGNOSIS — I5020 Unspecified systolic (congestive) heart failure: Secondary | ICD-10-CM

## 2023-12-22 MED ORDER — GADOBUTROL 1 MMOL/ML IV SOLN
13.0000 mL | Freq: Once | INTRAVENOUS | Status: AC | PRN
Start: 2023-12-22 — End: 2023-12-22
  Administered 2023-12-22: 13 mL via INTRAVENOUS

## 2023-12-23 ENCOUNTER — Telehealth: Payer: Self-pay | Admitting: Cardiology

## 2023-12-23 ENCOUNTER — Encounter: Payer: Self-pay | Admitting: Cardiology

## 2023-12-23 NOTE — Telephone Encounter (Signed)
 Patient returned RN's call regarding results.

## 2023-12-23 NOTE — Telephone Encounter (Signed)
 See previous phone note.

## 2023-12-23 NOTE — Telephone Encounter (Signed)
 Spoke with the patient and his wife who are still unsure about whether he wants to proceed with ICD implant. I have scheduled him for an appointment with Levy Sjogren, NP on 3/25 to discuss further

## 2023-12-24 ENCOUNTER — Ambulatory Visit: Payer: Medicare Other | Attending: Medical | Admitting: Medical

## 2023-12-24 ENCOUNTER — Encounter: Payer: Self-pay | Admitting: Medical

## 2023-12-24 VITALS — BP 108/72 | HR 75 | Ht 73.0 in | Wt 212.6 lb

## 2023-12-24 DIAGNOSIS — I5022 Chronic systolic (congestive) heart failure: Secondary | ICD-10-CM | POA: Diagnosis not present

## 2023-12-24 DIAGNOSIS — E782 Mixed hyperlipidemia: Secondary | ICD-10-CM

## 2023-12-24 DIAGNOSIS — I255 Ischemic cardiomyopathy: Secondary | ICD-10-CM

## 2023-12-24 DIAGNOSIS — I2510 Atherosclerotic heart disease of native coronary artery without angina pectoris: Secondary | ICD-10-CM | POA: Diagnosis not present

## 2023-12-24 DIAGNOSIS — I10 Essential (primary) hypertension: Secondary | ICD-10-CM | POA: Diagnosis not present

## 2023-12-24 NOTE — Patient Instructions (Signed)
 Medication Instructions:  The current medical regimen is effective;  continue present plan and medications as directed. Please refer to the Current Medication list given to you today.   *If you need a refill on your cardiac medications before your next appointment, please call your pharmacy*  Follow-Up: At [facility], you and your health needs are our priority.  As part of our continuing mission to provide you with exceptional heart care, we have created designated Provider Care Teams.  These Care Teams include your primary Cardiologist (physician) and Advanced Practice Providers (APPs -  Physician Assistants and Nurse Practitioners) who all work together to provide you with the care you need, when you need it.  We recommend signing up for the patient portal called "MyChart".  Sign up information is provided on this After Visit Summary.  MyChart is used to connect with patients for Virtual Visits (Telemedicine).  Patients are able to view lab/test results, encounter notes, upcoming appointments, etc.  Non-urgent messages can be sent to your provider as well.   To learn more about what you can do with MyChart, go to [email or web address].    Your next appointment:   6 month(s)  Provider:   You may see Yvonne Kendall, MD or one of the following Advanced Practice Providers on your designated Care Team:   Nicolasa Ducking, NP Eula Listen, PA-C Cadence Fransico Michael, PA-C Charlsie Quest, NP Carlos Levering, NP

## 2023-12-24 NOTE — Progress Notes (Signed)
 Cardiology Office Note:  .   Date:  12/24/2023  ID:  Connor Haas., DOB 01/12/1956, MRN [id number] PCP: Cain Saupe, MD  [facility] Providers Cardiologist:  Yvonne Kendall, MD Electrophysiologist:  Lanier Prude, MD {  History of Present Illness: .   Jsaon Haas. is a 68 y.o. male with a hx of HTN, HLD, prediabetic, anxiety, chronic pain, vasovagal syncope, CAD s/p DES LAD 04/2023, HFrEF, ICM, who presents for follow-up.    Patient was admitted 05/07/2021 with non-STEMI.  Troponins peaked at 4000.  Cardiac cath showed CTO of the LAD with subsequent DES to proximal/mid LAD. He had residual disease 20 to 30% ostial LAD and 30% mid left circumflex stenosis. He was started on DAPT with aspirin and ticagrelor for at least 12 months. Echo showed newly reduced pump function of 30% with normal RV function, hypokinesis of the mid to distal, inferior, inferoseptal, and apical walls. Patient had multiple episodes of relatively slow VT post revascularization.  Patient was started on Coreg, [place], Jardiance.  Repeat echocardiogram showed EF of 30 to 35%, grade 2 diastolic dysfunction, mild MR.  Patient was referred to EP to discuss ICD.  Cardiac MRI was ordered, this showed moderate-severely reduced LVSF, subendocardial LGE involving the LV mid-apical anterior, anteroseptal, entire apical walls, LV LGE accounting for 35% of LV myocardial mass, moderately reduced RVSF, findings consistent with ischemic CM.   Today, the patient is overall doing well. He has been going three times a week to the [facility]. He has been walking, biking, and weights. He feels good exercising. He denies chest pain, SOB, lightheadedness, dizziness. He is eating and drinking normally. He is feeling better.   Studies Reviewed: Marland Kitchen   EKG Interpretation Date/Time:  Friday December 24 2023 13:52:23 EDT Ventricular Rate:  66 PR Interval:  156 QRS Duration:  150 QT Interval:  432 QTC Calculation: 452 R  Axis:   122  Text Interpretation: Normal sinus rhythm Right bundle branch block Anterolateral infarct (cited on or before 01-May-2023) Confirmed by Fransico Michael, Avalynne Diver ([id number]) on 12/24/2023 2:00:00 PM    cMRI 12/2023 IMPRESSION: 1. Moderate-severely reduced LV systolic function (LVEF = 32%).   2. Subendocardial LGE involving the LV mid-apical anterior, anteroseptal, entire apical walls.   3. LV LGE accounts for 46 g (approximately 35% of LV myocardial mass).   4. Moderately reduced RV systolic function.   5. No significant valvular abnormalities.   6. Findings consistent with ischemic cardiomyopathy, prior infarct involving the LAD territory.     Electronically Signed   By: Debbe Odea M.D.   On: 12/23/2023 10:45  Limited echo 06/2023 1. Left ventricular ejection fraction, by estimation, is 30 to 35%. Left  ventricular ejection fraction by PLAX is 34 %. The left ventricle has  moderately decreased function. The left ventricle demonstrates regional  wall motion abnormalities (severe mid  to distal anteroseptal/anterior and apical hypokinesis). Left ventricular  diastolic parameters are consistent with Grade II diastolic dysfunction  (pseudonormalization).   2. Right ventricular systolic function is normal. The right ventricular  size is normal. Tricuspid regurgitation signal is inadequate for assessing  PA pressure.   3. The mitral valve is normal in structure. Mild mitral valve  regurgitation. No evidence of mitral stenosis.   4. The aortic valve is normal in structure. Aortic valve regurgitation is  not visualized. No aortic stenosis is present.   5. The inferior vena cava is normal in size with greater than 50%  respiratory variability,  suggesting right atrial pressure of 3 mmHg.    Echo 04/2023  1. Extremely poor acoustic windows limit study, even with Definity use.  There is akinesis of the mid/distal inferior, inferoseptal and apical  walls. Distal anterior wall is  hypokinetic. Left ventricular ejection  fraction, by estimation, is 30%. The left   ventricle has severely decreased function. There is mild left ventricular  hypertrophy.   2. Right ventricular systolic function is normal. The right ventricular  size is normal.   3. Trivial mitral valve regurgitation.   4. The aortic valve is normal in structure. Aortic valve regurgitation is  not visualized.   5. The inferior vena cava is normal in size with greater than 50%  respiratory variability, suggesting right atrial pressure of 3 mmHg.    LHC 04/2023 Conclusions: Severe single-vessel coronary artery with thrombotic occlusion of proximal/mid LAD.  There are also 20-30% ostial LAD and 30% mid LCx stenoses.  No significant disease observed in nondominant RCA. Severely reduced left ventricular systolic function (LVEF 35-35% with mid/apical anterior and apical inferior akinesis). Mildly elevated left ventricular filling pressure (LVEDP 24 mmHg). Successful PCI to proximal/mid LAD using Synergy XD 3.0 x 28 mm drug-eluting stent with 0% residual stenosis and TIMI-2 flow in the distal LAD (suspect sluggish distal flow is due to microvascular dysfunction).   Recommendations: Admit to 2-Heart ICU. Continue cangrelor infusion for 2 hours. Dual antiplatelet therapy with aspirin and ticagrelor for at least 12 months. Gentle diuresis. Initiate goal-directed medical therapy for acute HFrEF due to ischemic cardiomyopathy tomorrow as heart rate, blood pressure, and renal function allow. Follow-up echocardiogram. Aggressive secondary prevention of coronary artery disease.   Yvonne Kendall, MD [facility]      Physical Exam:   VS:  BP 108/72   Pulse 75   Ht 6\' 1"  (1.854 m)   Wt 212 lb 9.6 oz (96.4 kg)   SpO2 98%   BMI 28.05 kg/m    Wt Readings from Last 3 Encounters:  12/24/23 212 lb 9.6 oz (96.4 kg)  09/23/23 211 lb 6.4 oz (95.9 kg)  08/25/23 210 lb (95.3 kg)    GEN: Well nourished, well  developed in no acute distress NECK: No JVD; No carotid bruits CARDIAC: RRR, no murmurs, rubs, gallops RESPIRATORY:  Clear to auscultation without rales, wheezing or rhonchi  ABDOMEN: Soft, non-tender, non-distended EXTREMITIES:  No edema; No deformity   ASSESSMENT AND PLAN: .    CAD s/p DES p-mLAD 04/2023 The patient denies chest pain. He is exercising three times weekly at the [facility]. Continue DAPT with ASA and Brilinta x 12 months. Continue Coreg and Crestor.   HFrEF ICM Prior echo showed LVEF 30%. Repeat limited echo showed LVEF 30-35%. cMRI showed LVEF 32%, ischemic cardiomyopathy. He will see EP later this month to discuss ICD. We are awaiting approval for Jardiance. He did not tolerate Entresto due to low BP. Continue Coreg and spironolactone.   HTN BP is good, continue current medications.   HLD LDL 32. Continue Crestor 40mg  daily.   Dispo: follow-up in 6 months  Signed, Carlise Stofer David Stall, PA-C

## 2023-12-27 ENCOUNTER — Other Ambulatory Visit (HOSPITAL_COMMUNITY): Payer: Self-pay

## 2023-12-27 ENCOUNTER — Other Ambulatory Visit: Payer: Self-pay

## 2023-12-27 ENCOUNTER — Telehealth: Payer: Self-pay | Admitting: Medical

## 2023-12-27 MED ORDER — CARVEDILOL 3.125 MG PO TABS
3.1250 mg | ORAL_TABLET | Freq: Two times a day (BID) | ORAL | 11 refills | Status: DC
Start: 2023-12-27 — End: 2024-01-14

## 2023-12-27 MED ORDER — TICAGRELOR 90 MG PO TABS
90.0000 mg | ORAL_TABLET | Freq: Two times a day (BID) | ORAL | 0 refills | Status: DC
  Filled 2023-12-27: qty 180, 90d supply, fill #0

## 2023-12-27 NOTE — Telephone Encounter (Signed)
 This is a Educational psychologist pt

## 2023-12-27 NOTE — Telephone Encounter (Signed)
*  STAT* If patient is at the pharmacy, call can be transferred to refill team.   1. Which medications need to be refilled? (please list name of each medication and dose if known)   ticagrelor (BRILINTA) 90 MG TABS tablet   OUT OF STATE PHARMACY   4. Which pharmacy/location (including street and city if local pharmacy) is medication to be sent to? [id number] - [place], [place] - [phone] AT [facility] OF [address] Phone: [phone]  Fax: [phone]       5. Do they need a 30 day or 90 day supply? 7 DAY SUPPLY, LEFT PILLS AT HOME

## 2023-12-27 NOTE — Telephone Encounter (Signed)
 Refill sent as requested.

## 2023-12-27 NOTE — Telephone Encounter (Signed)
 Good Morning,  Please advise if ok to send in rx? The patient was last seen by C.Furth on 12/24/2023. Medication was last prescribed by Abagail Kitchens, PA-C  when the patient was in the hospital on 04/2023?

## 2023-12-29 NOTE — Telephone Encounter (Signed)
 Please call Vikki Ports (pt's spouse) to discuss issue with medication being sent to out of town pharmacy and amount

## 2023-12-29 NOTE — Telephone Encounter (Signed)
 Returned call to pt's wife. She noted she is currently on the line to get her question answered and will call back if needed.

## 2024-01-04 ENCOUNTER — Ambulatory Visit: Attending: Cardiology | Admitting: Cardiology

## 2024-01-04 ENCOUNTER — Encounter: Payer: Self-pay | Admitting: Cardiology

## 2024-01-04 VITALS — BP 117/70 | HR 58 | Resp 21 | Ht 73.0 in | Wt 213.0 lb

## 2024-01-04 DIAGNOSIS — I255 Ischemic cardiomyopathy: Secondary | ICD-10-CM | POA: Diagnosis not present

## 2024-01-04 DIAGNOSIS — I5020 Unspecified systolic (congestive) heart failure: Secondary | ICD-10-CM | POA: Diagnosis not present

## 2024-01-04 MED ORDER — NITROGLYCERIN 0.4 MG SL SUBL: 0.4000 mg | SUBLINGUAL_TABLET | SUBLINGUAL | 2 refills | Status: AC | PRN

## 2024-01-04 NOTE — Patient Instructions (Addendum)
 Medication Instructions:  No changes at this time.   *If you need a refill on your cardiac medications before your next appointment, please call your pharmacy*   Lab Work: None  If you have labs (blood work) drawn today and your tests are completely normal, you will receive your results only by: MyChart Message (if you have MyChart) OR A paper copy in the mail If you have any lab test that is abnormal or we need to change your treatment, we will call you to review the results.   Testing/Procedures: None   Follow-Up: At [facility], you and your health needs are our priority.  As part of our continuing mission to provide you with exceptional heart care, we have created designated Provider Care Teams.  These Care Teams include your primary Cardiologist (physician) and Advanced Practice Providers (APPs -  Physician Assistants and Nurse Practitioners) who all work together to provide you with the care you need, when you need it.   Your next appointment:   As needed for EP  Keep scheduled follow up with primary cardiology. Dr. Okey Dupre or Cadence Fransico Michael PA-C   Referral placed to see Heart Failure Clinic their number is [phone]

## 2024-01-04 NOTE — Progress Notes (Signed)
 Electrophysiology Clinic Note    Date:  01/04/2024  Patient ID:  Connor Haas., DOB Jul 03, 1956, MRN [id number] PCP:  Cain Saupe, MD  Cardiologist:  Yvonne Kendall, MD Electrophysiologist: Lanier Prude, MD   Discussed the use of AI scribe software for clinical note transcription with the patient, who gave verbal consent to proceed.   Patient Profile    Chief Complaint: HFrEF  History of Present Illness: Connor Talerico. is a 68 y.o. male with PMH notable for CAD, ICM, HFrEF, HTN, HLD, ; seen today for Lanier Prude, MD for routine electrophysiology followup.   He last saw Dr. Lalla Brothers 08/2023 to discuss ICD implant given ongoing reduced LVEF. He had cardiac MRI recently that confirmed ongoing reduced LVEF.  On follow-up today, he continues to feel well. He denies chest pain, chest pressure, palpitations. Good energy level, exercises regular without any concerning symptoms. He denies lower extremity edema, sleeps elevated on pillows for GERD mgmt, good appetite.    Patient's wife joined for visit.    Arrhythmia/Device History No specialty comments available.      ROS:  Please see the history of present illness. All other systems are reviewed and otherwise negative.    Physical Exam    VS:  BP 117/70 (BP Location: Left Arm, Patient Position: Sitting, Cuff Size: Normal)   Pulse (!) 58   Resp (!) 21   Ht 6\' 1"  (1.854 m)   Wt 213 lb (96.6 kg)   BMI 28.10 kg/m  BMI: Body mass index is 28.1 kg/m.  Wt Readings from Last 3 Encounters:  01/04/24 213 lb (96.6 kg)  12/24/23 212 lb 9.6 oz (96.4 kg)  09/23/23 211 lb 6.4 oz (95.9 kg)     GEN- The patient is well appearing, alert and oriented x 3 today.   Lungs- Clear to ausculation bilaterally, normal work of breathing.  Heart- Regular rate and rhythm, no murmurs, rubs or gallops Extremities- No peripheral edema, warm, dry    Studies Reviewed   Previous EP, cardiology notes.    EKG is not  ordered. Personal review of EKG from  12/24/2023  shows:  SR at 66bpm; RBBB QRS        Cardiac MRI, 12/22/2023 1. Moderate-severely reduced LV systolic function (LVEF = 32%).  2. Subendocardial LGE involving the LV mid-apical anterior, anteroseptal, entire apical walls.  3. LV LGE accounts for 46 g (approximately 35% of LV myocardial mass). 4. Moderately reduced RV systolic function.  5. No significant valvular abnormalities.  6. Findings consistent with ischemic cardiomyopathy, prior infarct involving the LAD territory.  Limited TTE, 07/01/2023 1. Left ventricular ejection fraction, by estimation, is 30 to 35%. Left ventricular ejection fraction by PLAX is 34 %. The left ventricle has moderately decreased function. The left ventricle demonstrates regional wall motion abnormalities (severe mid to distal anteroseptal/anterior and apical hypokinesis). Left ventricular diastolic parameters are consistent with Grade II diastolic dysfunction  (pseudonormalization).   2. Right ventricular systolic function is normal. The right ventricular size is normal. Tricuspid regurgitation signal is inadequate for assessing PA pressure.   3. The mitral valve is normal in structure. Mild mitral valve regurgitation. No evidence of mitral stenosis.   4. The aortic valve is normal in structure. Aortic valve regurgitation is not visualized. No aortic stenosis is present.   5. The inferior vena cava is normal in size with greater than 50% respiratory variability, suggesting right atrial pressure of 3 mmHg.   TTE,  05/02/2023 1. Extremely poor acoustic windows limit study, even with Definity use. There is akinesis of the mid/distal inferior, inferoseptal and apical walls. Distal anterior wall is hypokinetic. Left ventricular ejection fraction, by estimation, is 30%. The left ventricle has severely decreased function. There is mild left ventricular hypertrophy.   2. Right ventricular systolic function is normal. The  right ventricular size is normal.   3. Trivial mitral valve regurgitation.   4. The aortic valve is normal in structure. Aortic valve regurgitation is not visualized.   5. The inferior vena cava is normal in size with greater than 50% respiratory variability, suggesting right atrial pressure of 3 mmHg.     Assessment and Plan     #) ICM History of CAD s/p PCI to LAD. LVEF has remained reduced since that time. Recent cardiac MRI with LVEF 32% Discussed rationale for ICD implant, patient meets criteria for primary prevention Risks/benefits discussed.  Patient does not want to pursue ICD at this time, wants to think about it more. Recommended he notify office if he has additional questions or would like to schedule procedure.   #) HFrEF Warm and euvolemic on exam with good functional capacity Borderline BP limites up-titration of GDMT Continue 3.125mg  coreg, 10mg  jardiance, 12.5mg  losartan, 12.5mg  spiro Recommend establish care with HF team, no acute concerns        Current medicines are reviewed at length with the patient today.   The patient does not have concerns regarding his medicines.  The following changes were made today:  none  Labs/ tests ordered today include:  Orders Placed This Encounter  Procedures   AMB referral to CHF clinic     Disposition: Follow up with Dr. Lalla Brothers or EP APP  PRN   Schedule appt with HF team at next available   Signed, Sherie Don, NP  01/04/24  1:58 PM  Electrophysiology [facility]

## 2024-01-06 ENCOUNTER — Other Ambulatory Visit (HOSPITAL_COMMUNITY): Payer: Self-pay

## 2024-01-13 ENCOUNTER — Telehealth: Payer: Self-pay | Admitting: Cardiology

## 2024-01-13 NOTE — Telephone Encounter (Incomplete)
 Called to confirm/remind patient of their appointment at the [facility] on 01/14/24***.   Appointment:   [x] Confirmed  [] Left mess   [] No answer/No voice mail  [] Phone not in service  Patient reminded to bring all medications and/or complete list.  Confirmed patient has transportation. Gave directions, instructed to utilize valet parking.

## 2024-01-14 ENCOUNTER — Ambulatory Visit: Attending: Cardiology | Admitting: Cardiology

## 2024-01-14 VITALS — BP 106/66 | HR 60 | Wt 214.6 lb

## 2024-01-14 DIAGNOSIS — E785 Hyperlipidemia, unspecified: Secondary | ICD-10-CM | POA: Diagnosis not present

## 2024-01-14 DIAGNOSIS — I2510 Atherosclerotic heart disease of native coronary artery without angina pectoris: Secondary | ICD-10-CM | POA: Diagnosis not present

## 2024-01-14 DIAGNOSIS — I5022 Chronic systolic (congestive) heart failure: Secondary | ICD-10-CM | POA: Diagnosis not present

## 2024-01-14 DIAGNOSIS — I255 Ischemic cardiomyopathy: Secondary | ICD-10-CM | POA: Insufficient documentation

## 2024-01-14 DIAGNOSIS — I5020 Unspecified systolic (congestive) heart failure: Secondary | ICD-10-CM | POA: Diagnosis not present

## 2024-01-14 DIAGNOSIS — Z79899 Other long term (current) drug therapy: Secondary | ICD-10-CM | POA: Diagnosis not present

## 2024-01-14 DIAGNOSIS — Z955 Presence of coronary angioplasty implant and graft: Secondary | ICD-10-CM | POA: Diagnosis not present

## 2024-01-14 DIAGNOSIS — I110 Hypertensive heart disease with heart failure: Secondary | ICD-10-CM | POA: Diagnosis not present

## 2024-01-14 DIAGNOSIS — I4510 Unspecified right bundle-branch block: Secondary | ICD-10-CM | POA: Insufficient documentation

## 2024-01-14 DIAGNOSIS — I252 Old myocardial infarction: Secondary | ICD-10-CM | POA: Diagnosis not present

## 2024-01-14 MED ORDER — SPIRONOLACTONE 25 MG PO TABS: 25.0000 mg | ORAL_TABLET | Freq: Every day | ORAL | 3 refills | Status: DC

## 2024-01-14 MED ORDER — CARVEDILOL 6.25 MG PO TABS: 6.2500 mg | ORAL_TABLET | Freq: Two times a day (BID) | ORAL | 3 refills | Status: DC

## 2024-01-14 NOTE — Patient Instructions (Signed)
 Medication Changes:  INCREASE Carvedilol 6.25mg  (1 tab) two times daily  INCREASE Spironolactone 25mg  (1 tab) daily  Lab Work:  Go over to the [facility]. Go pass the gift shop and have your blood work completed.  We will only call you if the results are abnormal or if the provider would like to make medication changes.   Referrals:  You have been referred to the Lipid Clinic. They will contact you in order to schedule an appointment.  Follow-Up in: Please follow up with the Heart Failure Clinic in 1 month.  At the [facility], you and your health needs are our priority. We have a designated team specialized in the treatment of Heart Failure. This Care Team includes your primary Heart Failure Specialized Cardiologist (physician), Advanced Practice Providers (APPs- Physician Assistants and Nurse Practitioners), and Pharmacist who all work together to provide you with the care you need, when you need it.   You may see any of the following providers on your designated Care Team at your next follow up:  Dr. Arvilla Meres Dr. Marca Ancona Dr. Dorthula Nettles Dr. Theresia Bough Clarisa Kindred, FNP Enos Fling, RPH-CPP  Please be sure to bring in all your medications bottles to every appointment.   Need to Contact us:  If you have any questions or concerns before your next appointment please send [place] a message through [place] or call our office at [phone].    TO LEAVE A MESSAGE FOR THE NURSE SELECT OPTION 2, PLEASE LEAVE A MESSAGE INCLUDING: YOUR NAME DATE OF BIRTH CALL BACK NUMBER REASON FOR CALL**this is important as we prioritize the call backs  YOU WILL RECEIVE A CALL BACK THE SAME DAY AS LONG AS YOU CALL BEFORE 4:00 PM

## 2024-01-15 NOTE — Progress Notes (Signed)
 PCP: Cain Saupe, MD Cardiology: Dr. Okey Dupre HF Cardiology: Dr. Shirlee Latch  Chief complaint: CHF  68 y.o. with history of CAD and ischemic cardiomyopathy was referred by NP Riddle for evaluation of CHF. Patient was admitted in 7/24 with NSTEMI, HS-TnI peaked at 4000.  Cath showed occluded proximal to mid LAD treated with DES.  Echo showed EF 30%.  He had VT runs immediately post-revascularization.  Repeat echo in 9/24 showed EF 30-35%, and cardiac MRI in 3/25 showed LV EF 32% with extensive post-MI scar in LAD distribution.  He was seen by EP, ICD was recommended.  He remains unsure.   Patient has done well post-MI.  He completed cardiac rehab and has now joined a gym.  No dyspnea walking for 1 hour.  Able to weed-eat and mow without problems. Uses treadmill and bike at gym.  No chest pain.  No orthopnea/PND.  Had lightheadedness with Entresto but doing ok with losartan. No palpitations.   ECG (personally reviewed): NSR, RBBB, old anterolateral MI  Labs (7/24): LDL 46, Lp(a) 198 Labs (1/25): K 4.2, creatinine 1.03  PMH: 1. GERD 2. HTN 3. Hyperlipidemia: Elevated Lp(a)  4. CAD: NSTEMI 7/24, cath with occluded prox/mid LAD treated with DES.  5. Chronic systolic CHF: Ischemic cardiomyopathy.  - Echo (7/24): EF 30% - Echo (9/24): EF 30-35%, normal RV - Cardiac MRI (3/25): LV EF 32%, RV EF 34%; mid-apical anterior/anteroseptal subendocardial LGE and extensive apical subendocardial LGE.   SH: Married, retired, lives in [place].  Quit smoking in 2014.  Rare ETOH.   FH: Father with MI at 40  ROS: All systems reviewed and negative except as per HPI.   Current Outpatient Medications  Medication Sig Dispense Refill   ALPRAZolam (XANAX) 1 MG tablet Take 0.5 mg by mouth 3 (three) times daily as needed for sleep. For anxiety     aspirin EC 81 MG tablet Take 1 tablet (81 mg total) by mouth daily. Swallow whole. 120 tablet 3   empagliflozin (JARDIANCE) 10 MG TABS tablet Take 1 tablet (10 mg total) by  mouth daily. 14 tablet 0   losartan (COZAAR) 25 MG tablet Take 0.5 tablets (12.5 mg total) by mouth daily. 45 tablet 3   nitroGLYCERIN (NITROSTAT) 0.4 MG SL tablet Place 1 tablet (0.4 mg total) under the tongue every 5 (five) minutes x 3 doses as needed for chest pain. 25 tablet 2   rosuvastatin (CRESTOR) 40 MG tablet Take 1 tablet (40 mg total) by mouth daily. 90 tablet 3   ticagrelor (BRILINTA) 90 MG TABS tablet Take 1 tablet (90 mg total) by mouth 2 (two) times daily. 180 tablet 0   carvedilol (COREG) 6.25 MG tablet Take 1 tablet (6.25 mg total) by mouth 2 (two) times daily with a meal. 60 tablet 3   spironolactone (ALDACTONE) 25 MG tablet Take 1 tablet (25 mg total) by mouth daily. 90 tablet 3   No current facility-administered medications for this visit.   BP 106/66   Pulse 60   Wt 214 lb 9.6 oz (97.3 kg)   SpO2 99%   BMI 28.31 kg/m  General: NAD Neck: No JVD, no thyromegaly or thyroid nodule.  Lungs: Clear to auscultation bilaterally with normal respiratory effort. CV: Nondisplaced PMI.  Heart regular S1/S2, no S3/S4, no murmur.  No peripheral edema.  No carotid bruit.  Normal pedal pulses.  Abdomen: Soft, nontender, no hepatosplenomegaly, no distention.  Skin: Intact without lesions or rashes.  Neurologic: Alert and oriented x 3.  Psych: Normal  affect. Extremities: No clubbing or cyanosis.  HEENT: Normal.   1. Chronic systolic CHF: Ischemic cardiomyopathy.  Persistent LV dysfunction post- MI in 7/24.  Cardiac MRI in 3/25 showed LV EF 32%, RV EF 34%; mid-apical anterior/anteroseptal subendocardial LGE and extensive apical subendocardial LGE. NYHA class I, not volume overloaded on exam.  Blood pressure has limited GDMT titration.  - Increase Coreg to 6.25 mg bid.  - Continue Jardiance 10 mg daily.  - Increase spironolactone to 25 mg daily.  BMET/BNP today, BMET in 10 days.  - Continue losartan 12.5 mg daily.  He had lightheadedness with attempted transition to [facility] in the  past.  In the future, will either increase losartan or try Entresto transition again depending on BP trend.  - We discussed ICD today.  He has persistently low EF post-MI with significant scarring on cardiac MRI.  I do think that his risk for malignant arrhythmia over time is significant.  I recommended that he get an ICD.  Not CRT candidate with narrow QRS.  He seems to be leaning towards ICD, he will make followup with EP if he decides on it.  2. CAD: NSTEMI 7/24 with occluded prox/mid LAD treated with DES. He has a strong FH of premature CAD as well as elevated Lp(a).  No chest pain.  Completed cardiac rehab.  - Continue ASA 81 and ticagrelor 90 bid.  After 1 year, would favor single agent clopidogrel.  - Continue Crestor 40 mg daily. 3. Hyperlipidemia: LDL was 46 in 7/24 but noted very high Lp(a).  - Continue Crestor for now.  - I will refer to lipid clnic, he may be best served by lower dose Crestor + Repatha given the elevated Lp(a).  Would ideally start specific Lp(a)-directed therapy when available in the future.   Followup in 1 month with me for med titration.   I spent 57 minutes reviewing records, interviewing/examining patient, and managing orders.   Marca Ancona 01/15/2024

## 2024-01-17 ENCOUNTER — Other Ambulatory Visit: Payer: Self-pay | Admitting: Cardiology

## 2024-01-17 ENCOUNTER — Other Ambulatory Visit: Payer: Self-pay

## 2024-01-17 MED ORDER — ROSUVASTATIN CALCIUM 40 MG PO TABS: 40.0000 mg | ORAL_TABLET | Freq: Every day | ORAL | 3 refills | Status: AC

## 2024-01-17 NOTE — Telephone Encounter (Signed)
 This is a Educational psychologist pt

## 2024-01-20 ENCOUNTER — Encounter: Payer: Self-pay | Admitting: Pharmacist Clinician (PhC)/ Clinical Pharmacy Specialist

## 2024-01-20 ENCOUNTER — Other Ambulatory Visit: Payer: Self-pay | Admitting: Pharmacist Clinician (PhC)/ Clinical Pharmacy Specialist

## 2024-01-20 ENCOUNTER — Ambulatory Visit: Attending: Cardiovascular Disease | Admitting: Pharmacist Clinician (PhC)/ Clinical Pharmacy Specialist

## 2024-01-20 DIAGNOSIS — E782 Mixed hyperlipidemia: Secondary | ICD-10-CM

## 2024-01-20 NOTE — Patient Instructions (Signed)
 Your Results:             Your most recent labs Goal  Total Cholesterol 97 < 200  Triglycerides 119 < 150  HDL (happy/good cholesterol) 41 > 40  LDL (lousy/bad cholesterol 32 < 70   Medication changes:  We will start the process to get Repatha covered by your insurance.  Once the prior authorization is complete, I will call/send a MyChart message to let you know and confirm pharmacy information.   You will take one injection every 2 weeks  Lab orders:  We want to repeat labs after 2-3 months.  We will send you a lab order to remind you once we get closer to that time.     Thank you for choosing [facility]

## 2024-01-20 NOTE — Assessment & Plan Note (Signed)
 Assessment: Patient with ASCVD at LDL goal of < 70 Most recent LDL 32 on 06/10/23 Has been compliant with high intensity statin : rosuvastatin 40 Not able to tolerate statins secondary to myalgias Reviewed PCSK-9 inhibitors and their ability to lower Lp(a).  Discussed mechanisms of action, dosing, side effects, potential decreases in LDL cholesterol and costs.  Also reviewed potential options for patient assistance.  Explained that this is not an FDA approved indication and we are not likely to get coverage, as LDL is at goal.    Plan: Will get updated labs to see if any change in LDL.   Most likely will not be able to get Repatha as Lp(a) lowering is not an FDA indication for the medication.   Will add patient name to list for future Lp(a) medication approvals

## 2024-01-20 NOTE — Progress Notes (Signed)
 Office Visit    Patient Name: Connor Haas. Date of Encounter: 01/20/2024  Primary Care Provider:  Cain Saupe, MD Primary Cardiologist:  Yvonne Kendall, MD  Chief Complaint    Hyperlipidemia   Significant Past Medical History   CAD 7/24 NSTEMI - DES tto LAD  HFrEF 9/24 echo showed 30-35%, 3/25 MRI EF at 32%  HTN Controlled on current medications     Allergies  Allergen Reactions   Lactose Intolerance (Gi) Diarrhea   Penicillins Nausea Only    Sweating & a high fever   Penicillins    Sertraline Hcl     REACTION: headache   Zoloft [Sertraline]     Headache   Oxycodone     Rash with percocet.  Has  Tolerated hydrocodone in the past.      History of Present Illness    Connor Stockert. is a 68 y.o. male patient of Dr Shirlee Latch, in the office today to discuss options for cholesterol management.  Insurance Carrier: [employer] Medicare    LDL Cholesterol goal:  LDL < 70  Current Medications:   rosuvastatin 40 mg every day   Social Hx: Tobacco: quit 2014 Alcohol: no     Diet:  per wife has changed diet significantly since MI.  Now mostly Mediterranean style and has cut back on sweets/desserts.    Exercise: going to gym three times per week, cardi, weights, walks 30 min on other days  Accessory Clinical Findings   Lab Results  Component Value Date   CHOL 97 06/10/2023   HDL 41 06/10/2023   LDLCALC 32 06/10/2023   LDLDIRECT 149.4 06/26/2010   TRIG 119 06/10/2023   CHOLHDL 2.4 06/10/2023    Lipoprotein (a)  Date/Time Value Ref Range Status  05/02/2023 02:57 AM 197.8 (H) <75.0 nmol/L Final    Comment:    (NOTE) Note:  Values greater than or equal to 75.0 nmol/L may       indicate an independent risk factor for CHD,       but must be evaluated with caution when applied       to non-Caucasian populations due to the       influence of genetic factors on Lp(a) across       ethnicities. Performed At: [facility] [address] [place], [place]  [id number] Jolene Schimke MD YQ:[id number]     Lab Results  Component Value Date   ALT 42 06/10/2023   AST 41 06/10/2023   ALKPHOS 83 06/10/2023   BILITOT 0.9 06/10/2023   Lab Results  Component Value Date   CREATININE 1.03 10/15/2023   BUN 21 10/15/2023   NA 137 10/15/2023   K 4.2 10/15/2023   CL 105 10/15/2023   CO2 21 (L) 10/15/2023   Lab Results  Component Value Date   HGBA1C 6.2 (H) 05/02/2023    Home Medications    Current Outpatient Medications  Medication Sig Dispense Refill   ALPRAZolam (XANAX) 1 MG tablet Take 0.5 mg by mouth 3 (three) times daily as needed for sleep. For anxiety     aspirin EC 81 MG tablet Take 1 tablet (81 mg total) by mouth daily. Swallow whole. 120 tablet 3   carvedilol (COREG) 6.25 MG tablet TAKE 1 TABLET BY MOUTH 2 TIMES DAILY WITH A MEAL. 180 tablet 3   empagliflozin (JARDIANCE) 10 MG TABS tablet Take 1 tablet (10 mg total) by mouth daily. 14 tablet 0   losartan (COZAAR) 25 MG tablet Take 0.5  tablets (12.5 mg total) by mouth daily. 45 tablet 3   nitroGLYCERIN (NITROSTAT) 0.4 MG SL tablet Place 1 tablet (0.4 mg total) under the tongue every 5 (five) minutes x 3 doses as needed for chest pain. 25 tablet 2   rosuvastatin (CRESTOR) 40 MG tablet Take 1 tablet (40 mg total) by mouth daily. 90 tablet 3   spironolactone (ALDACTONE) 25 MG tablet Take 1 tablet (25 mg total) by mouth daily. 90 tablet 3   ticagrelor (BRILINTA) 90 MG TABS tablet Take 1 tablet (90 mg total) by mouth 2 (two) times daily. 180 tablet 0   No current facility-administered medications for this visit.     Assessment & Plan    Hyperlipidemia Assessment: Patient with ASCVD at LDL goal of < 70 Most recent LDL 32 on 06/10/23 Has been compliant with high intensity statin : rosuvastatin 40 Not able to tolerate statins secondary to myalgias Reviewed PCSK-9 inhibitors and their ability to lower Lp(a).  Discussed mechanisms of action, dosing, side effects, potential decreases in  LDL cholesterol and costs.  Also reviewed potential options for patient assistance.  Explained that this is not an FDA approved indication and we are not likely to get coverage, as LDL is at goal.    Plan: Will get updated labs to see if any change in LDL.   Most likely will not be able to get Repatha as Lp(a) lowering is not an FDA indication for the medication.   Will add patient name to list for future Lp(a) medication approvals   Phillips Hay, PharmD CPP [facility] [address]  [place], [place] [address] [phone]  01/20/2024, 11:25 AM

## 2024-01-24 ENCOUNTER — Other Ambulatory Visit
Admission: RE | Admit: 2024-01-24 | Discharge: 2024-01-24 | Disposition: A | Discharge status: Home / Self Care | Source: Ambulatory Visit | Attending: Cardiology | Admitting: Cardiology

## 2024-01-24 ENCOUNTER — Other Ambulatory Visit
Admission: RE | Admit: 2024-01-24 | Discharge: 2024-01-24 | Disposition: A | Discharge status: Home / Self Care | Source: Ambulatory Visit | Attending: *Deleted | Admitting: *Deleted

## 2024-01-24 DIAGNOSIS — I5020 Unspecified systolic (congestive) heart failure: Secondary | ICD-10-CM | POA: Diagnosis present

## 2024-01-24 LAB — LIPID PANEL
Cholesterol: 110 mg/dL (ref 0–200)
HDL: 50 mg/dL (ref >40)
LDL Cholesterol: 37 mg/dL (ref 0–99)
Total CHOL/HDL Ratio: 2.2 ratio
Triglycerides: 113 mg/dL (ref <150)
VLDL: 23 mg/dL (ref 0–40)

## 2024-01-24 LAB — BASIC METABOLIC PANEL WITH GFR
Anion gap: 7 (ref 5–15)
BUN: 21 mg/dL (ref 8–23)
CO2: 25 mmol/L (ref 22–32)
Calcium: 9 mg/dL (ref 8.9–10.3)
Chloride: 106 mmol/L (ref 98–111)
Creatinine, Ser: 1.13 mg/dL (ref 0.61–1.24)
GFR, Estimated: >60 mL/min (ref >60)
Glucose, Bld: 113 mg/dL — ABNORMAL HIGH (ref 70–99)
Potassium: 4.3 mmol/L (ref 3.5–5.1)
Sodium: 138 mmol/L (ref 135–145)

## 2024-01-24 NOTE — Addendum Note (Signed)
 Addended by: Ihor Meinzer C on: 01/24/2024 10:44 AM   Modules accepted: Orders

## 2024-01-24 NOTE — Addendum Note (Signed)
 Addended by: Ara Bays C on: 01/24/2024 10:39 AM   Modules accepted: Orders

## 2024-02-10 ENCOUNTER — Telehealth: Payer: Self-pay | Admitting: Cardiology

## 2024-02-10 NOTE — Telephone Encounter (Signed)
 Called to confirm/remind patient of their appointment at the [facility] on 02/11/24.   Appointment:   [x] Confirmed  [] Left mess   [] No answer/No voice mail  [] VM Full/unable to leave message  [] Phone not in service  Patient reminded to bring all medications and/or complete list.  Confirmed patient has transportation. Gave directions, instructed to utilize valet parking.

## 2024-02-11 ENCOUNTER — Ambulatory Visit: Attending: Cardiology | Admitting: Cardiology

## 2024-02-11 ENCOUNTER — Encounter: Payer: Self-pay | Admitting: Cardiology

## 2024-02-11 VITALS — BP 112/66 | HR 58 | Wt 210.8 lb

## 2024-02-11 DIAGNOSIS — I2510 Atherosclerotic heart disease of native coronary artery without angina pectoris: Secondary | ICD-10-CM | POA: Diagnosis not present

## 2024-02-11 DIAGNOSIS — I5020 Unspecified systolic (congestive) heart failure: Secondary | ICD-10-CM

## 2024-02-11 DIAGNOSIS — E785 Hyperlipidemia, unspecified: Secondary | ICD-10-CM | POA: Insufficient documentation

## 2024-02-11 DIAGNOSIS — Z7902 Long term (current) use of antithrombotics/antiplatelets: Secondary | ICD-10-CM | POA: Insufficient documentation

## 2024-02-11 DIAGNOSIS — Z955 Presence of coronary angioplasty implant and graft: Secondary | ICD-10-CM | POA: Diagnosis not present

## 2024-02-11 DIAGNOSIS — Z7984 Long term (current) use of oral hypoglycemic drugs: Secondary | ICD-10-CM | POA: Insufficient documentation

## 2024-02-11 DIAGNOSIS — Z7982 Long term (current) use of aspirin: Secondary | ICD-10-CM | POA: Insufficient documentation

## 2024-02-11 DIAGNOSIS — I252 Old myocardial infarction: Secondary | ICD-10-CM | POA: Insufficient documentation

## 2024-02-11 DIAGNOSIS — I5022 Chronic systolic (congestive) heart failure: Secondary | ICD-10-CM | POA: Diagnosis not present

## 2024-02-11 DIAGNOSIS — Z79899 Other long term (current) drug therapy: Secondary | ICD-10-CM | POA: Diagnosis not present

## 2024-02-11 DIAGNOSIS — I110 Hypertensive heart disease with heart failure: Secondary | ICD-10-CM | POA: Insufficient documentation

## 2024-02-11 DIAGNOSIS — I255 Ischemic cardiomyopathy: Secondary | ICD-10-CM | POA: Diagnosis not present

## 2024-02-11 MED ORDER — SACUBITRIL-VALSARTAN 24-26 MG PO TABS: 1.0000 | ORAL_TABLET | Freq: Two times a day (BID) | ORAL | 3 refills | Status: DC

## 2024-02-11 NOTE — Patient Instructions (Signed)
 Medication Changes:  STOP Losartan   START Entresto  24-26mg  (1 tab) two times daily  Lab Work: IN TWO WEEKS  Go over to the [facility]. Go pass the gift shop and have your blood work completed.  We will only call you if the results are abnormal or if the provider would like to make medication changes.   Special Instructions // Education:  Call the office back in you decide you want to get an ICD  Follow-Up in: Please follow up with the [facility] in July 2025. We currently do not have that schedule. Please give us  a call in June in order to schedule your appointment for July.   At the [facility], you and your health needs are our priority. We have a designated team specialized in the treatment of Heart Failure. This Care Team includes your primary Heart Failure Specialized Cardiologist (physician), Advanced Practice Providers (APPs- Physician Assistants and Nurse Practitioners), and Pharmacist who all work together to provide you with the care you need, when you need it.   You may see any of the following providers on your designated Care Team at your next follow up:  Dr. Jules Oar Dr. Peder Bourdon Dr. Alwin Baars Dr. Judyth Nunnery Shawnee Dellen, FNP Bevely Brush, RPH-CPP  Please be sure to bring in all your medications bottles to every appointment.   Need to Contact Us :  If you have any questions or concerns before your next appointment please send us  a message through [place] or call our office at [phone].    TO LEAVE A MESSAGE FOR THE NURSE SELECT OPTION 2, PLEASE LEAVE A MESSAGE INCLUDING: YOUR NAME DATE OF BIRTH CALL BACK NUMBER REASON FOR CALL**this is important as we prioritize the call backs  YOU WILL RECEIVE A CALL BACK THE SAME DAY AS LONG AS YOU CALL BEFORE 4:00 PM

## 2024-02-13 NOTE — Progress Notes (Signed)
 PCP: Berneda Bridges, MD Cardiology: Dr. Nolan Battle HF Cardiology: Dr. Mitzie Anda  Chief complaint: CHF  68 y.o. with history of CAD and ischemic cardiomyopathy was referred by NP Riddle for evaluation of CHF. Patient was admitted in 7/24 with NSTEMI, HS-TnI peaked at 4000.  Cath showed occluded proximal to mid LAD treated with DES.  Echo showed EF 30%.  He had VT runs immediately post-revascularization.  Repeat echo in 9/24 showed EF 30-35%, and cardiac MRI in 3/25 showed LV EF 32% with extensive post-MI scar in LAD distribution.  He was seen by EP, ICD was recommended.  He remains unsure.   Patient has done well post-MI.  He completed cardiac rehab and has now joined [employer] and works out regularly.  Weight down 4 lbs.  Does mowing and weed-eating without problems.  No chest pain.  No exertional dyspnea.  No orthopnea/PND.  No lightheadedness or palpitations.   Labs (7/24): LDL 46, Lp(a) 198 Labs (1/25): K 4.2, creatinine 1.03 Labs (4/25): K 4.3, creatinine 1.13, LDL 37  PMH: 1. GERD 2. HTN 3. Hyperlipidemia: Elevated Lp(a)  4. CAD: NSTEMI 7/24, cath with occluded prox/mid LAD treated with DES.  5. Chronic systolic CHF: Ischemic cardiomyopathy.  - Echo (7/24): EF 30% - Echo (9/24): EF 30-35%, normal RV - Cardiac MRI (3/25): LV EF 32%, RV EF 34%; mid-apical anterior/anteroseptal subendocardial LGE and extensive apical subendocardial LGE.   SH: Married, retired, lives in [place].  Quit smoking in 2014.  Rare ETOH.   FH: Father with MI at 40  ROS: All systems reviewed and negative except as per HPI.   Current Outpatient Medications  Medication Sig Dispense Refill   ALPRAZolam  (XANAX ) 1 MG tablet Take 0.5 mg by mouth 3 (three) times daily as needed for sleep. For anxiety     aspirin  EC 81 MG tablet Take 1 tablet (81 mg total) by mouth daily. Swallow whole. 120 tablet 3   carvedilol  (COREG ) 6.25 MG tablet TAKE 1 TABLET BY MOUTH 2 TIMES DAILY WITH A MEAL. 180 tablet 3   empagliflozin  (JARDIANCE ) 10  MG TABS tablet Take 1 tablet (10 mg total) by mouth daily. 14 tablet 0   nitroGLYCERIN  (NITROSTAT ) 0.4 MG SL tablet Place 1 tablet (0.4 mg total) under the tongue every 5 (five) minutes x 3 doses as needed for chest pain. 25 tablet 2   rosuvastatin  (CRESTOR ) 40 MG tablet Take 1 tablet (40 mg total) by mouth daily. 90 tablet 3   sacubitril -valsartan  (ENTRESTO ) 24-26 MG Take 1 tablet by mouth 2 (two) times daily. 180 tablet 3   spironolactone  (ALDACTONE ) 25 MG tablet Take 1 tablet (25 mg total) by mouth daily. 90 tablet 3   ticagrelor  (BRILINTA ) 90 MG TABS tablet Take 1 tablet (90 mg total) by mouth 2 (two) times daily. 180 tablet 0   No current facility-administered medications for this visit.   BP 112/66   Pulse (!) 58   Wt 210 lb 12.8 oz (95.6 kg)   SpO2 100%   BMI 27.81 kg/m  General: NAD Neck: No JVD, no thyromegaly or thyroid nodule.  Lungs: Clear to auscultation bilaterally with normal respiratory effort. CV: Nondisplaced PMI.  Heart regular S1/S2, no S3/S4, no murmur.  No peripheral edema.  No carotid bruit.  Normal pedal pulses.  Abdomen: Soft, nontender, no hepatosplenomegaly, no distention.  Skin: Intact without lesions or rashes.  Neurologic: Alert and oriented x 3.  Psych: Normal affect. Extremities: No clubbing or cyanosis.  HEENT: Normal.   1. Chronic  systolic CHF: Ischemic cardiomyopathy.  Persistent LV dysfunction post- MI in 7/24.  Cardiac MRI in 3/25 showed LV EF 32%, RV EF 34%; mid-apical anterior/anteroseptal subendocardial LGE and extensive apical subendocardial LGE. NYHA class I, not volume overloaded.  BP has limited GDMT in the past but now seems to be running higher.  - Continue Coreg  6.25 mg bid.  - Continue Jardiance  10 mg daily.  - Continue spironolactone  25 mg daily.   - With BP running higher, will try again to transition to Entresto .  Stop losartan , start Entresto  24/26 bid with BMET/BNP today and BMET in 10 days.  - We discussed again ICD today  extensively.  He has persistently low EF post-MI with significant scarring on cardiac MRI.  I do think that his risk for malignant arrhythmia over time is significant.  I again recommended that he get an ICD.  Not CRT candidate with narrow QRS.  He still seems to be leaning towards ICD but wants to think more about it.  I told him to call if he decides he wants to get an ICD and I will refer him to EP. 2. CAD: NSTEMI 7/24 with occluded prox/mid LAD treated with DES. He has a strong FH of premature CAD as well as elevated Lp(a).  No chest pain.  Completed cardiac rehab.  - Continue ASA 81 and ticagrelor  90 bid.  After 1 year (7/25), would favor single agent clopidogrel.  - Continue Crestor  40 mg daily. 3. Hyperlipidemia: LDL was 37 in 4/25 but noted very high Lp(a).  - Continue Crestor .   - He was seen in lipid clinic due to elevated Lp(a), plan will be to get him on an anti-Lp(a) drug when it is commercially available.  He cannot get coverage right now for Repatha.    Followup in 7/25.   I spent 32 minutes reviewing records, interviewing/examining patient, and managing orders.   Peder Bourdon 02/13/2024

## 2024-02-14 ENCOUNTER — Telehealth: Payer: Self-pay

## 2024-02-14 ENCOUNTER — Other Ambulatory Visit (HOSPITAL_COMMUNITY): Payer: Self-pay

## 2024-02-14 NOTE — Telephone Encounter (Signed)
 Advanced [facility] Failure Patient Advocate Encounter  Test billing for Entresto  returns copays of $47 for 30 days or $141 for 90 days.  Kennis Peacock, CPhT Rx Patient Advocate Phone: [phone]

## 2024-02-16 ENCOUNTER — Telehealth (HOSPITAL_COMMUNITY): Payer: Self-pay | Admitting: Pharmacist

## 2024-02-16 NOTE — Telephone Encounter (Signed)
 [employer] grant approved and will cover Entresto , spironolactone , Jardiance , and carvedilol . [facility] called and confirmed.  ID [id number] BIN [id number] PCN [id number] Group [id number]

## 2024-02-22 ENCOUNTER — Other Ambulatory Visit: Payer: Self-pay | Admitting: Medical

## 2024-04-05 ENCOUNTER — Other Ambulatory Visit (HOSPITAL_COMMUNITY): Payer: Self-pay

## 2024-04-05 ENCOUNTER — Telehealth: Payer: Self-pay

## 2024-04-05 NOTE — Telephone Encounter (Signed)
 Advanced [facility] Failure Patient Advocate Encounter  Patient spouse contacted office regarding copay for SGLT2i. This patient has an active grant that will cover the cost of copay. Spoke to pharmacy to process medication, confirmed $0 copay for Jardiance . Informed pt and spouse by phone.  Rachel DEL, CPhT Rx Patient Advocate Phone: [phone]

## 2024-05-03 ENCOUNTER — Telehealth: Payer: Self-pay | Admitting: Cardiology

## 2024-05-03 NOTE — Telephone Encounter (Signed)
 Called to confirm/remind patient of their appointment at the [facility] on 05/04/24.   Appointment:   [x] Confirmed  [] Left mess   [] No answer/No voice mail  [] VM Full/unable to leave message  [] Phone not in service  Patient reminded to bring all medications and/or complete list.  Confirmed patient has transportation. Gave directions, instructed to utilize valet parking.

## 2024-05-04 ENCOUNTER — Other Ambulatory Visit
Admission: RE | Admit: 2024-05-04 | Discharge: 2024-05-04 | Disposition: A | Discharge status: Home / Self Care | Source: Ambulatory Visit | Attending: Cardiology | Admitting: Cardiology

## 2024-05-04 ENCOUNTER — Ambulatory Visit (HOSPITAL_COMMUNITY): Payer: Self-pay | Admitting: Cardiology

## 2024-05-04 ENCOUNTER — Ambulatory Visit: Admitting: Cardiology

## 2024-05-04 VITALS — BP 116/68 | HR 63 | Wt 208.0 lb

## 2024-05-04 DIAGNOSIS — I252 Old myocardial infarction: Secondary | ICD-10-CM | POA: Diagnosis not present

## 2024-05-04 DIAGNOSIS — I2510 Atherosclerotic heart disease of native coronary artery without angina pectoris: Secondary | ICD-10-CM | POA: Diagnosis present

## 2024-05-04 DIAGNOSIS — Z955 Presence of coronary angioplasty implant and graft: Secondary | ICD-10-CM | POA: Insufficient documentation

## 2024-05-04 DIAGNOSIS — Z7902 Long term (current) use of antithrombotics/antiplatelets: Secondary | ICD-10-CM | POA: Insufficient documentation

## 2024-05-04 DIAGNOSIS — E785 Hyperlipidemia, unspecified: Secondary | ICD-10-CM | POA: Insufficient documentation

## 2024-05-04 DIAGNOSIS — I5022 Chronic systolic (congestive) heart failure: Secondary | ICD-10-CM | POA: Insufficient documentation

## 2024-05-04 DIAGNOSIS — I5020 Unspecified systolic (congestive) heart failure: Secondary | ICD-10-CM | POA: Diagnosis not present

## 2024-05-04 DIAGNOSIS — I110 Hypertensive heart disease with heart failure: Secondary | ICD-10-CM | POA: Insufficient documentation

## 2024-05-04 DIAGNOSIS — I255 Ischemic cardiomyopathy: Secondary | ICD-10-CM | POA: Insufficient documentation

## 2024-05-04 LAB — BASIC METABOLIC PANEL WITH GFR
Anion gap: 8 (ref 5–15)
BUN: 26 mg/dL — ABNORMAL HIGH (ref 8–23)
CO2: 25 mmol/L (ref 22–32)
Calcium: 9.2 mg/dL (ref 8.9–10.3)
Chloride: 105 mmol/L (ref 98–111)
Creatinine, Ser: 1.27 mg/dL — ABNORMAL HIGH (ref 0.61–1.24)
GFR, Estimated: >60 mL/min (ref >60)
Glucose, Bld: 86 mg/dL (ref 70–99)
Potassium: 4.7 mmol/L (ref 3.5–5.1)
Sodium: 138 mmol/L (ref 135–145)

## 2024-05-04 LAB — BRAIN NATRIURETIC PEPTIDE: B Natriuretic Peptide: 166.2 pg/mL — ABNORMAL HIGH (ref 0.0–100.0)

## 2024-05-04 MED ORDER — SACUBITRIL-VALSARTAN 49-51 MG PO TABS: 1.0000 | ORAL_TABLET | Freq: Two times a day (BID) | ORAL | 6 refills | Status: DC

## 2024-05-04 MED ORDER — CLOPIDOGREL BISULFATE 75 MG PO TABS: 75.0000 mg | ORAL_TABLET | Freq: Every day | ORAL | 3 refills | Status: AC

## 2024-05-04 NOTE — Patient Instructions (Signed)
 Medication Changes:  STOP Aspirin   STOP Brilinta   START Clopidogrel  75mg  (1 tab) daily  INCREASE Entresto  49-51mg  (1 tab) two times daily   Lab Work:  Go over to the [facility]. Go pass the gift shop and have your blood work completed today and in two weeks.  We will only call you if the results are abnormal or if the provider would like to make medication changes.  If you are having labs drawn today, any labs that are abnormal the clinic will call you. No news is good news.   Testing/Procedures:  Your physician has requested that you have an echocardiogram. Echocardiography is a painless test that uses sound waves to create images of your heart. It provides your doctor with information about the size and shape of your heart and how well your heart's chambers and valves are working. This procedure takes approximately one hour. There are no restrictions for this procedure. Please do NOT wear cologne, perfume, aftershave, or lotions (deodorant is allowed). Please arrive 15 minutes prior to your appointment time.  Please note: We ask at that you not bring children with you during ultrasound (echo/ vascular) testing. Due to room size and safety concerns, children are not allowed in the ultrasound rooms during exams. Our front office staff cannot provide observation of children in our lobby area while testing is being conducted. An adult accompanying a patient to their appointment will only be allowed in the ultrasound room at the discretion of the ultrasound technician under special circumstances. We apologize for any inconvenience.   Follow-Up in: Please follow up with the [facility] in 3 months with Dr. Rolan. We do not currently have that schedule. Please give us  a call in Sept in order to schedule your appointment for October.    Thank you for choosing [facility].    At the [facility], you and your  health needs are our priority. We have a designated team specialized in the treatment of Heart Failure. This Care Team includes your primary Heart Failure Specialized Cardiologist (physician), Advanced Practice Providers (APPs- Physician Assistants and Nurse Practitioners), and Pharmacist who all work together to provide you with the care you need, when you need it.   You may see any of the following providers on your designated Care Team at your next follow up:  Dr. Toribio Fuel Dr. Ezra Rolan Dr. Ria Commander Dr. Morene Brownie Ellouise Class, FNP Jaun Bash, RPH-CPP  Please be sure to bring in all your medications bottles to every appointment.   Need to Contact Us :  If you have any questions or concerns before your next appointment please send us  a message through [place] or call our office at [phone].    TO LEAVE A MESSAGE FOR THE NURSE SELECT OPTION 2, PLEASE LEAVE A MESSAGE INCLUDING: YOUR NAME DATE OF BIRTH CALL BACK NUMBER REASON FOR CALL**this is important as we prioritize the call backs  YOU WILL RECEIVE A CALL BACK THE SAME DAY AS LONG AS YOU CALL BEFORE 4:00 PM

## 2024-05-05 NOTE — Progress Notes (Signed)
 PCP: Alec House, MD Cardiology: Dr. Mady HF Cardiology: Dr. Rolan  Chief complaint: CHF  68 y.o. with history of CAD and ischemic cardiomyopathy was referred by NP Riddle for evaluation of CHF. Patient was admitted in 7/24 with NSTEMI, HS-TnI peaked at 4000.  Cath showed occluded proximal to mid LAD treated with DES.  Echo showed EF 30%.  He had VT runs immediately post-revascularization.  Repeat echo in 9/24 showed EF 30-35%, and cardiac MRI in 3/25 showed LV EF 32% with extensive post-MI scar in LAD distribution.  He was seen by EP, ICD was recommended.  He remains unsure.   Patient has done well post-MI.  Weight down 2 lbs.  He is going to the [facility] 3 times/week, walks 2 miles at a time.  Does yardwork.  No exertional dyspnea or chest pain.  No lightheadedness.   Labs (7/24): LDL 46, Lp(a) 198 Labs (1/25): K 4.2, creatinine 1.03 Labs (4/25): K 4.3, creatinine 1.13, LDL 37  ECG (personally reviewed): NSR, RBBB, inferior Qs  PMH: 1. GERD 2. HTN 3. Hyperlipidemia: Elevated Lp(a)  4. CAD: NSTEMI 7/24, cath with occluded prox/mid LAD treated with DES.  5. Chronic systolic CHF: Ischemic cardiomyopathy.  - Echo (7/24): EF 30% - Echo (9/24): EF 30-35%, normal RV - Cardiac MRI (3/25): LV EF 32%, RV EF 34%; mid-apical anterior/anteroseptal subendocardial LGE and extensive apical subendocardial LGE.   SH: Married, retired, lives in [place].  Quit smoking in 2014.  Rare ETOH.   FH: Father with MI at 40  ROS: All systems reviewed and negative except as per HPI.   Current Outpatient Medications  Medication Sig Dispense Refill   clopidogrel  (PLAVIX ) 75 MG tablet Take 1 tablet (75 mg total) by mouth daily. 90 tablet 3   sacubitril -valsartan  (ENTRESTO ) 49-51 MG Take 1 tablet by mouth 2 (two) times daily. 60 tablet 6   ALPRAZolam  (XANAX ) 1 MG tablet Take 0.5 mg by mouth 3 (three) times daily as needed for sleep. For anxiety     carvedilol  (COREG ) 6.25 MG tablet TAKE 1 TABLET BY MOUTH 2 TIMES  DAILY WITH A MEAL. 180 tablet 3   empagliflozin  (JARDIANCE ) 10 MG TABS tablet Take 1 tablet (10 mg total) by mouth daily. 14 tablet 0   nitroGLYCERIN  (NITROSTAT ) 0.4 MG SL tablet Place 1 tablet (0.4 mg total) under the tongue every 5 (five) minutes x 3 doses as needed for chest pain. 25 tablet 2   rosuvastatin  (CRESTOR ) 40 MG tablet Take 1 tablet (40 mg total) by mouth daily. 90 tablet 3   spironolactone  (ALDACTONE ) 25 MG tablet Take 1 tablet (25 mg total) by mouth daily. 90 tablet 3   No current facility-administered medications for this visit.   BP 116/68   Pulse 63   Wt 208 lb (94.3 kg)   SpO2 98%   BMI 27.44 kg/m  General: NAD Neck: No JVD, no thyromegaly or thyroid nodule.  Lungs: Clear to auscultation bilaterally with normal respiratory effort. CV: Nondisplaced PMI.  Heart regular S1/S2, no S3/S4, no murmur.  No peripheral edema.  No carotid bruit.  Normal pedal pulses.  Abdomen: Soft, nontender, no hepatosplenomegaly, no distention.  Skin: Intact without lesions or rashes.  Neurologic: Alert and oriented x 3.  Psych: Normal affect. Extremities: No clubbing or cyanosis.  HEENT: Normal.   1. Chronic systolic CHF: Ischemic cardiomyopathy.  Persistent LV dysfunction post- MI in 7/24.  Cardiac MRI in 3/25 showed LV EF 32%, RV EF 34%; mid-apical anterior/anteroseptal subendocardial LGE and extensive  apical subendocardial LGE. NYHA class I, not volume overloaded.  BP has tolerated Entresto .  - Continue Coreg  6.25 mg bid.  - Continue Jardiance  10 mg daily.  - Continue spironolactone  25 mg daily.   - Increase Entresto  to 49/51 bid, BMET/BNP today and BMET in 10 days.  - We again discussed ICD.  He has persistently low EF post-MI with significant scarring on cardiac MRI.  I do think that his risk for malignant arrhythmia over time is significant.  I again recommended that he get an ICD.  Not CRT candidate with narrow QRS. He is not ready to get an ICD yet.  Will repeat echo in another 6  months or so and readdress ICD.  2. CAD: NSTEMI 7/24 with occluded prox/mid LAD treated with DES. He has a strong FH of premature CAD as well as elevated Lp(a).  No chest pain.  Completed cardiac rehab.  - He is 1 year post-PCI, can stop ASA and ticagrelor  and start clopidogrel  75 mg daily long-term.  - Continue Crestor  40 mg daily.  Good lipids in 4/25.  3. Hyperlipidemia: LDL was 37 in 4/25 but noted very high Lp(a).  - Continue Crestor .   - He was seen in lipid clinic due to elevated Lp(a), plan will be to get him on an anti-Lp(a) drug when it is commercially available.  He cannot get coverage right now for Repatha.    Followup 6 months, BMET in 3 months.   I spent 31 minutes reviewing records, interviewing/examining patient, and managing orders.   Connor Haas 05/05/2024

## 2024-05-08 ENCOUNTER — Other Ambulatory Visit (HOSPITAL_COMMUNITY): Payer: Self-pay

## 2024-05-10 ENCOUNTER — Telehealth: Payer: Self-pay | Admitting: Cardiology

## 2024-05-10 NOTE — Telephone Encounter (Signed)
 Pt wife called stating that his blood pressures were low. She states that since started the increase dose of entresto  49-51 last Friday, his blood pressures have ran in the 90's/60's. The patient is asymptomatic. She was just concerned that the number seemed low.

## 2024-05-19 ENCOUNTER — Other Ambulatory Visit
Admission: RE | Admit: 2024-05-19 | Discharge: 2024-05-19 | Disposition: A | Discharge status: Home / Self Care | Attending: Cardiology | Admitting: Cardiology

## 2024-05-19 DIAGNOSIS — I5020 Unspecified systolic (congestive) heart failure: Secondary | ICD-10-CM | POA: Insufficient documentation

## 2024-05-19 LAB — BASIC METABOLIC PANEL WITH GFR
Anion gap: 9 (ref 5–15)
BUN: 23 mg/dL (ref 8–23)
CO2: 22 mmol/L (ref 22–32)
Calcium: 8.9 mg/dL (ref 8.9–10.3)
Chloride: 107 mmol/L (ref 98–111)
Creatinine, Ser: 1.23 mg/dL (ref 0.61–1.24)
GFR, Estimated: >60 mL/min (ref >60)
Glucose, Bld: 102 mg/dL — ABNORMAL HIGH (ref 70–99)
Potassium: 4.3 mmol/L (ref 3.5–5.1)
Sodium: 138 mmol/L (ref 135–145)

## 2024-05-22 ENCOUNTER — Ambulatory Visit (HOSPITAL_COMMUNITY): Payer: Self-pay | Admitting: Cardiology

## 2024-05-24 ENCOUNTER — Telehealth: Payer: Self-pay | Admitting: Pharmacy Technician

## 2024-05-24 NOTE — Telephone Encounter (Signed)
   I notified az&me that the patient is no longer on brilinta  as of 05/04/24

## 2024-05-29 ENCOUNTER — Telehealth: Payer: Self-pay

## 2024-05-29 ENCOUNTER — Telehealth: Payer: Self-pay | Admitting: Cardiology

## 2024-05-29 NOTE — Telephone Encounter (Signed)
 Pt's wife called. She says her husband has been having low bp's. Previously oked as long as he was asymptomatic. He started feeling fatigued and dizzy. He switched to taking old entresto  24-26mg  versus the 49-51 and feels much better. Pt would like to stay on this dose and get refills?

## 2024-05-29 NOTE — Telephone Encounter (Signed)
 He can stay on Entresto  24/26 bid.

## 2024-05-30 MED ORDER — SACUBITRIL-VALSARTAN 24-26 MG PO TABS: 1.0000 | ORAL_TABLET | Freq: Two times a day (BID) | ORAL | 3 refills | Status: DC

## 2024-07-10 ENCOUNTER — Telehealth: Payer: Self-pay | Admitting: Cardiology

## 2024-07-25 ENCOUNTER — Encounter: Admitting: Cardiology

## 2024-08-11 ENCOUNTER — Ambulatory Visit: Attending: Medical | Admitting: Medical

## 2024-08-11 ENCOUNTER — Encounter: Payer: Self-pay | Admitting: Medical

## 2024-08-11 VITALS — BP 110/70 | HR 69 | Ht 73.0 in | Wt 213.4 lb

## 2024-08-11 DIAGNOSIS — I2510 Atherosclerotic heart disease of native coronary artery without angina pectoris: Secondary | ICD-10-CM

## 2024-08-11 DIAGNOSIS — I255 Ischemic cardiomyopathy: Secondary | ICD-10-CM | POA: Diagnosis not present

## 2024-08-11 DIAGNOSIS — I214 Non-ST elevation (NSTEMI) myocardial infarction: Secondary | ICD-10-CM | POA: Diagnosis not present

## 2024-08-11 DIAGNOSIS — I5020 Unspecified systolic (congestive) heart failure: Secondary | ICD-10-CM

## 2024-08-11 DIAGNOSIS — I2583 Coronary atherosclerosis due to lipid rich plaque: Secondary | ICD-10-CM

## 2024-08-11 DIAGNOSIS — I10 Essential (primary) hypertension: Secondary | ICD-10-CM | POA: Diagnosis not present

## 2024-08-11 DIAGNOSIS — E782 Mixed hyperlipidemia: Secondary | ICD-10-CM

## 2024-08-11 NOTE — Progress Notes (Signed)
 Cardiology Office Note   Date:  08/14/2024  ID:  Marinda KANDICE Learta Mickey., DOB Jan 25, 1956, MRN [id number] PCP: Alec House, MD  [facility] Providers Cardiologist:  Lonni Hanson, MD Electrophysiologist:  OLE ONEIDA HOLTS, MD   History of Present Illness Verne Lanuza. is a 68 y.o. male with a history of CAD, ischemic cardiomyopathy, chronic systolic heart failure hypertension, hyperlipidemia presents for follow-up.  Patient was admitted in July 2024 with non-STEMI, high-sensitivity troponin peaked to 4000.  Cath showed occluded proximal to mid LAD treated with DES.  Echo showed EF of 30%.  He had VT runs immediately post revascularization.  Repeat echo in 06/2023 showed EF of 30 to 35%.  Cardiac MRI 12/2023 showed EF of 32% with extensive PostMI scar in the LAD D distribution.  He was seen by EP and ICD was recommended, however he remained unsure.  More recently, patient has been followed by the advanced heart failure team.  He was last seen 05/04/2024 and was stable from a cardiac perspective.  ICD was again recommended, but patient remained unsure.  Today, the patient reports he is overall doing well. His wife has 2 bulging discs and this has been stressful. Because of this, he hasn't been able to go to the [facility] as much. He denies chest pain, SOB, lower leg edema. He is still not interested in ICD.   Studies Reviewed EKG Interpretation Date/Time:  Friday August 11 2024 14:43:28 EDT Ventricular Rate:  69 PR Interval:  154 QRS Duration:  138 QT Interval:  410 QTC Calculation: 439 R Axis:   104  Text Interpretation: Normal sinus rhythm Right bundle branch block Possible Inferior infarct (cited on or before 02-May-2023) Anterolateral infarct (cited on or before 01-May-2023) When compared with ECG of 04-May-2024 15:18, No significant change was found Confirmed by Franchester, Raea Magallon ([id number]) on 08/11/2024 3:16:44 PM    cMRI 12/2023 IMPRESSION: 1. Moderate-severely reduced LV systolic  function (LVEF = 32%).   2. Subendocardial LGE involving the LV mid-apical anterior, anteroseptal, entire apical walls.   3. LV LGE accounts for 46 g (approximately 35% of LV myocardial mass).   4. Moderately reduced RV systolic function.   5. No significant valvular abnormalities.   6. Findings consistent with ischemic cardiomyopathy, prior infarct involving the LAD territory.     Electronically Signed   By: Redell Cave M.D.   On: 12/23/2023 10:45   Limited echo 06/2023 1. Left ventricular ejection fraction, by estimation, is 30 to 35%. Left  ventricular ejection fraction by PLAX is 34 %. The left ventricle has  moderately decreased function. The left ventricle demonstrates regional  wall motion abnormalities (severe mid  to distal anteroseptal/anterior and apical hypokinesis). Left ventricular  diastolic parameters are consistent with Grade II diastolic dysfunction  (pseudonormalization).   2. Right ventricular systolic function is normal. The right ventricular  size is normal. Tricuspid regurgitation signal is inadequate for assessing  PA pressure.   3. The mitral valve is normal in structure. Mild mitral valve  regurgitation. No evidence of mitral stenosis.   4. The aortic valve is normal in structure. Aortic valve regurgitation is  not visualized. No aortic stenosis is present.   5. The inferior vena cava is normal in size with greater than 50%  respiratory variability, suggesting right atrial pressure of 3 mmHg.    Echo 04/2023  1. Extremely poor acoustic windows limit study, even with Definity  use.  There is akinesis of the mid/distal inferior, inferoseptal and apical  walls. Distal anterior wall is hypokinetic. Left ventricular ejection  fraction, by estimation, is 30%. The left   ventricle has severely decreased function. There is mild left ventricular  hypertrophy.   2. Right ventricular systolic function is normal. The right ventricular  size is normal.    3. Trivial mitral valve regurgitation.   4. The aortic valve is normal in structure. Aortic valve regurgitation is  not visualized.   5. The inferior vena cava is normal in size with greater than 50%  respiratory variability, suggesting right atrial pressure of 3 mmHg.    LHC 04/2023 Conclusions: Severe single-vessel coronary artery with thrombotic occlusion of proximal/mid LAD.  There are also 20-30% ostial LAD and 30% mid LCx stenoses.  No significant disease observed in nondominant RCA. Severely reduced left ventricular systolic function (LVEF 35-35% with mid/apical anterior and apical inferior akinesis). Mildly elevated left ventricular filling pressure (LVEDP 24 mmHg). Successful PCI to proximal/mid LAD using Synergy XD 3.0 x 28 mm drug-eluting stent with 0% residual stenosis and TIMI-2 flow in the distal LAD (suspect sluggish distal flow is due to microvascular dysfunction).   Recommendations: Admit to 2-Heart ICU. Continue cangrelor  infusion for 2 hours. Dual antiplatelet therapy with aspirin  and ticagrelor  for at least 12 months. Gentle diuresis. Initiate goal-directed medical therapy for acute HFrEF due to ischemic cardiomyopathy tomorrow as heart rate, blood pressure, and renal function allow. Follow-up echocardiogram. Aggressive secondary prevention of coronary artery disease.   Lonni Hanson, MD [facility]       Physical Exam VS:  BP 110/70 (BP Location: Left Arm, Patient Position: Sitting, Cuff Size: Large)   Pulse 69   Ht 6' 1 (1.854 m)   Wt 213 lb 6 oz (96.8 kg)   SpO2 98%   BMI 28.15 kg/m        Wt Readings from Last 3 Encounters:  08/11/24 213 lb 6 oz (96.8 kg)  05/04/24 208 lb (94.3 kg)  02/11/24 210 lb 12.8 oz (95.6 kg)    GEN: Well nourished, well developed in no acute distress NECK: No JVD; No carotid bruits CARDIAC: RRR, no murmurs, rubs, gallops RESPIRATORY:  Clear to auscultation without rales, wheezing or rhonchi  ABDOMEN: Soft,  non-tender, non-distended EXTREMITIES:  No edema; No deformity   ASSESSMENT AND PLAN  CAD s/p DES p-mLAD 04/2023 The patient denies anginal symptoms. He is not going to the [facility] as much due to his wife having back pain/bulging discs. Continue Plavix  75mg  daily, Coreg  6.25mg BID, SL NTG, Crestor  40 mg daily.  HFrEF ICM The patient is followed by the advanced heart failure team.  He had persistent LV dysfunction post MI echo July 2024.  Cardiac MRI in 3/25 showed LVEF of 32%, RVEF of 34%, mid to apical anterior/anteroseptal subendocardial LGE and extensive apical subendocardial LGE.  Repeat echo was ordered for 6 months.   Patient is euvolemic on exam.  Continue Coreg  6.25 mg twice daily, Jardiance  10 mg daily, spironolactone  25 mg daily, Entresto  49/51 mg twice daily.  Patient is again not interested in ICD.  HTN BP is normal. Continue current medications.   HLD LDL 37. Continue Crestor  40mg  daily.        Dispo: follow-up in 9 months  Signed, Chaise Passarella VEAR Fishman, PA-C

## 2024-08-11 NOTE — Patient Instructions (Signed)
 Medication Instructions:  Your physician recommends that you continue on your current medications as directed. Please refer to the Current Medication list given to you today.    *If you need a refill on your cardiac medications before your next appointment, please call your pharmacy*  Lab Work: No labs ordered today    Testing/Procedures: No test ordered today   Follow-Up: At [facility], you and your health needs are our priority.  As part of our continuing mission to provide you with exceptional heart care, our providers are all part of one team.  This team includes your primary Cardiologist (physician) and Advanced Practice Providers or APPs (Physician Assistants and Nurse Practitioners) who all work together to provide you with the care you need, when you need it.  Your next appointment:   9 month(s)  Provider:   Lonni Hanson, MD or Cadence Franchester, PA-C

## 2024-08-14 ENCOUNTER — Encounter: Payer: Self-pay | Admitting: Medical

## 2024-08-15 ENCOUNTER — Ambulatory Visit

## 2024-08-15 VITALS — BP 113/68 | HR 66 | Temp 98.0°F | Ht 73.0 in | Wt 211.0 lb

## 2024-08-15 DIAGNOSIS — Z1159 Encounter for screening for other viral diseases: Secondary | ICD-10-CM

## 2024-08-15 DIAGNOSIS — Z23 Encounter for immunization: Secondary | ICD-10-CM | POA: Diagnosis not present

## 2024-08-15 DIAGNOSIS — I214 Non-ST elevation (NSTEMI) myocardial infarction: Secondary | ICD-10-CM | POA: Diagnosis not present

## 2024-08-15 DIAGNOSIS — I472 Ventricular tachycardia, unspecified: Secondary | ICD-10-CM | POA: Diagnosis not present

## 2024-08-15 DIAGNOSIS — E782 Mixed hyperlipidemia: Secondary | ICD-10-CM

## 2024-08-15 DIAGNOSIS — Z1321 Encounter for screening for nutritional disorder: Secondary | ICD-10-CM

## 2024-08-15 DIAGNOSIS — Z7689 Persons encountering health services in other specified circumstances: Secondary | ICD-10-CM

## 2024-08-15 DIAGNOSIS — I10 Essential (primary) hypertension: Secondary | ICD-10-CM

## 2024-08-15 DIAGNOSIS — F411 Generalized anxiety disorder: Secondary | ICD-10-CM | POA: Diagnosis not present

## 2024-08-15 DIAGNOSIS — Z125 Encounter for screening for malignant neoplasm of prostate: Secondary | ICD-10-CM

## 2024-08-15 MED ORDER — BUPROPION HCL ER (XL) 150 MG PO TB24: 150.0000 mg | ORAL_TABLET | Freq: Every day | ORAL | 2 refills | Status: AC

## 2024-08-15 NOTE — Assessment & Plan Note (Signed)
 Patient interested in Wellbutrin as an alternative to SSRIs as he has not tolerated them in the past.  Will start Wellbutrin 150 mg XL daily in the mornings.  Discussed potential side effects.  Patient is on chronic benzodiazepine therapy as prescribed by his previous PCP.  I will take over this prescription when he is due for refill.  PDMP reviewed, no aberrancies and is consistent with what patient reports.  Ideally, the goal would be to lower either the frequency of use or lowered the dosage as he is been using 1 mg up to 3 times a day recently due to increased anxiety.  Will follow-up with him in 3 months to assess Wellbutrin efficacy and discuss lowering the dose of alprazolam  versus reducing the frequency.

## 2024-08-15 NOTE — Assessment & Plan Note (Signed)
 Rate controlled on carvedilol  6.25 twice daily.  Managed and monitored by cardiology.

## 2024-08-15 NOTE — Progress Notes (Signed)
 New Patient Office Visit  Subjective    Patient ID: Connor Clauss., male    DOB: 05-12-1956  Age: 68 y.o. MRN: [id number]  CC:  Chief Complaint  Patient presents with   New Patient (Initial Visit)    Transfer of Care    Discussed the use of AI scribe software for clinical note transcription with the patient, who gave verbal consent to proceed.  History of Present Illness   Connor Schar. is a 68 year old male with hypertension and coronary artery disease who presents for establishment of care and medication review.  Cardiovascular disease and hypertension - Diagnosed with hypertension and hyperlipidemia in July of last year - History of coronary artery disease with percutaneous coronary intervention and stent placement - Current medications include spironolactone  25 mg, Entresto  twice daily, rosuvastatin , Plavix , and carvedilol , all managed by cardiology - No current need for medication refills - Takes Jardiance  for heart-related indications, denies diabetes  Anxiety and psychiatric symptoms - Chronic anxiety, worsened by recent life stressors - On Xanax  for 30-40 years for anxiety management - Previously trialed Zoloft, which caused severe headaches - Is inquiring about Wellbutrin as an alternative to SSRIs   Cancer and preventive screening - Last colonoscopy in 2012 - Has not completed Cologuard test - No history of PSA testing for prostate cancer screening  Laboratory monitoring - Recent blood work performed in April and August - Due for updated laboratory studies including cholesterol, kidney function, liver function, hemoglobin A1c, PSA, and vitamin D       Colon Cancer: Ordered Cologuard today  Lung Cancer: NA due to pack year history <20 years  Breast Cancer: NA Diabetes: Checking A1c with labs HLD: Checking lipid panel with labs The ASCVD Risk score (Arnett DK, et al., 2019) failed to calculate for the following reasons:   Risk score cannot be  calculated because patient has a medical history suggesting prior/existing ASCVD  Outpatient Encounter Medications as of 08/15/2024  Medication Sig   ALPRAZolam  (XANAX ) 1 MG tablet Take 0.5 mg by mouth 3 (three) times daily as needed for sleep. For anxiety   buPROPion (WELLBUTRIN XL) 150 MG 24 hr tablet Take 1 tablet (150 mg total) by mouth daily.   carvedilol  (COREG ) 6.25 MG tablet TAKE 1 TABLET BY MOUTH 2 TIMES DAILY WITH A MEAL.   clopidogrel  (PLAVIX ) 75 MG tablet Take 1 tablet (75 mg total) by mouth daily.   empagliflozin  (JARDIANCE ) 10 MG TABS tablet Take 1 tablet (10 mg total) by mouth daily.   nitroGLYCERIN  (NITROSTAT ) 0.4 MG SL tablet Place 1 tablet (0.4 mg total) under the tongue every 5 (five) minutes x 3 doses as needed for chest pain.   rosuvastatin  (CRESTOR ) 40 MG tablet Take 1 tablet (40 mg total) by mouth daily.   sacubitril -valsartan  (ENTRESTO ) 24-26 MG Take 1 tablet by mouth 2 (two) times daily.   spironolactone  (ALDACTONE ) 25 MG tablet Take 1 tablet (25 mg total) by mouth daily.   No facility-administered encounter medications on file as of 08/15/2024.    Past Medical History:  Diagnosis Date   Acute appendicitis 10/05/2013   Lap Appy 10/05/13    Anxiety    Chronic pain    Degenerative joint disease    Depression    GERD (gastroesophageal reflux disease)    Hyperlipidemia    Hypertension    Pneumonia     Past Surgical History:  Procedure Laterality Date   APPENDECTOMY     CORONARY STENT INTERVENTION  N/A 05/01/2023   Procedure: CORONARY STENT INTERVENTION;  Surgeon: Mady Bruckner, MD;  Location: [facility] INVASIVE [facility];  Service: Cardiovascular;  Laterality: N/A;   LAPAROSCOPIC APPENDECTOMY N/A 10/05/2013   Procedure: APPENDECTOMY LAPAROSCOPIC;  Surgeon: Donnice KATHEE Lunger, MD;  Location: [facility] ORS;  Service: General;  Laterality: N/A;   LEFT HEART CATH AND CORONARY ANGIOGRAPHY N/A 05/01/2023   Procedure: LEFT HEART CATH AND CORONARY ANGIOGRAPHY;  Surgeon: Mady Bruckner, MD;  Location: [facility] INVASIVE CV LAB;  Service: Cardiovascular;  Laterality: N/A;    Family History  Problem Relation Age of Onset   Stroke Mother    Leukemia Mother    Diverticulitis Father    Heart disease Father 36   CAD Father    Colon cancer Maternal Grandmother    Colon polyps Neg Hx     Social History   Socioeconomic History   Marital status: Married    Spouse name: Not on file   Number of children: Not on file   Years of education: Not on file   Highest education level: Associate degree: occupational, scientist, product/process development, or vocational program  Occupational History   Not on file  Tobacco Use   Smoking status: Former    Current packs/day: 0.00    Types: Cigarettes    Quit date: 10/12/2012    Years since quitting: 11.8   Smokeless tobacco: Never  Vaping Use   Vaping status: Never Used  Substance and Sexual Activity   Alcohol use: Not Currently    Comment: Nothing since 2014   Drug use: Yes    Types: Marijuana   Sexual activity: Not on file  Other Topics Concern   Not on file  Social History Narrative   ** Merged History Encounter **       Social Drivers of Health   Financial Resource Strain: Low Risk  (08/14/2024)   Overall Financial Resource Strain (CARDIA)    Difficulty of Paying Living Expenses: Not very hard  Food Insecurity: No Food Insecurity (08/14/2024)   Hunger Vital Sign    Worried About Running Out of Food in the Last Year: Never true    Ran Out of Food in the Last Year: Never true  Transportation Needs: No Transportation Needs (08/14/2024)   PRAPARE - Administrator, Civil Service (Medical): No    Lack of Transportation (Non-Medical): No  Physical Activity: Sufficiently Active (08/14/2024)   Exercise Vital Sign    Days of Exercise per Week: 3 days    Minutes of Exercise per Session: 120 min  Stress: Stress Concern Present (08/14/2024)   [employer] of Occupational Health - Occupational Stress Questionnaire    Feeling of  Stress: Rather much  Social Connections: Moderately Integrated (08/14/2024)   Social Connection and Isolation Panel    Frequency of Communication with Friends and Family: Three times a week    Frequency of Social Gatherings with Friends and Family: Once a week    Attends Religious Services: 1 to 4 times per year    Active Member of [employer] or Organizations: No    Attends Banker Meetings: Not on file    Marital Status: Married  Catering Manager Violence: Not At Risk (05/02/2023)   Humiliation, Afraid, Rape, and Kick questionnaire    Fear of Current or Ex-Partner: No    Emotionally Abused: No    Physically Abused: No    Sexually Abused: No    ROS  Per HPI      Objective  BP 113/68   Pulse 66   Temp 98 F (36.7 C) (Oral)   Ht 6' 1 (1.854 m)   Wt 211 lb (95.7 kg)   SpO2 99%   BMI 27.84 kg/m   Physical Exam Constitutional:      General: He is not in acute distress.    Appearance: Normal appearance.  Cardiovascular:     Rate and Rhythm: Normal rate and regular rhythm.     Heart sounds: Normal heart sounds. No murmur heard.    No friction rub. No gallop.  Pulmonary:     Effort: Pulmonary effort is normal. No respiratory distress.     Breath sounds: Normal breath sounds.  Abdominal:     General: Bowel sounds are normal.  Musculoskeletal:        General: No swelling.     Cervical back: Neck supple.  Lymphadenopathy:     Cervical: No cervical adenopathy.  Skin:    General: Skin is warm and dry.  Neurological:     General: No focal deficit present.     Mental Status: He is alert.  Psychiatric:        Mood and Affect: Mood normal.        Behavior: Behavior normal.        Thought Content: Thought content normal.          Assessment & Plan:   Encounter for vaccination -     Tdap vaccine greater than or equal to 7yo IM  Mixed hyperlipidemia Assessment & Plan: LDL goal of less than 70.  Rechecking lipid panel and CMP with labs.  For now,  continue rosuvastatin  40 mg daily.  Continue regular follow-up with cardiology.  Orders: -     Hemoglobin A1c; Future -     Comprehensive metabolic panel with GFR; Future -     Lipid panel; Future  Primary hypertension Assessment & Plan: BP goal <130/80.  BP well within goal today in office.  Managed with spironolactone  25 mg daily and Entresto  twice daily.  He is also on carvedilol  6.25 mg twice a day.  Will continue to monitor.  Continue regular follow-ups with cardiology.  Orders: -     CBC with Differential/Platelet; Future -     TSH; Future  Encounter for vitamin deficiency screening -     VITAMIN D 25 Hydroxy (Vit-D Deficiency, Fractures); Future  Screening for viral disease -     Hepatitis C antibody; Future  Screening for prostate cancer -     PSA; Future  Ventricular tachycardia, unspecified (HCC) Assessment & Plan: Rate controlled on carvedilol  6.25 twice daily.  Managed and monitored by cardiology.   NSTEMI (non-ST elevated myocardial infarction) [facility]) Assessment & Plan: Monitored by cardiology.  Continue all current heart medications including spironolactone  25 mg, Entresto  twice daily, rosuvastatin  40 mg, Plavix , carvedilol  6.25 twice daily and Jardiance  25 mg daily.   Anxiety state Assessment & Plan: Patient interested in Wellbutrin as an alternative to SSRIs as he has not tolerated them in the past.  Will start Wellbutrin 150 mg XL daily in the mornings.  Discussed potential side effects.  Patient is on chronic benzodiazepine therapy as prescribed by his previous PCP.  I will take over this prescription when he is due for refill.  PDMP reviewed, no aberrancies and is consistent with what patient reports.  Ideally, the goal would be to lower either the frequency of use or lowered the dosage as he is been using 1 mg up to 3  times a day recently due to increased anxiety.  Will follow-up with him in 3 months to assess Wellbutrin efficacy and discuss lowering the dose of  alprazolam  versus reducing the frequency.   Other orders -     buPROPion HCl ER (XL); Take 1 tablet (150 mg total) by mouth daily.  Dispense: 30 tablet; Refill: 2     Return in about 3 months (around 11/15/2024) for HTN, HF, anxiety .   Saddie JULIANNA Sacks, PA-C

## 2024-08-15 NOTE — Assessment & Plan Note (Signed)
 Monitored by cardiology.  Continue all current heart medications including spironolactone  25 mg, Entresto  twice daily, rosuvastatin  40 mg, Plavix , carvedilol  6.25 twice daily and Jardiance  25 mg daily.

## 2024-08-15 NOTE — Assessment & Plan Note (Signed)
 BP goal <130/80.  BP well within goal today in office.  Managed with spironolactone  25 mg daily and Entresto  twice daily.  He is also on carvedilol  6.25 mg twice a day.  Will continue to monitor.  Continue regular follow-ups with cardiology.

## 2024-08-15 NOTE — Patient Instructions (Signed)
 VISIT SUMMARY: You came in today to establish care and review your medications. We discussed your cardiovascular health, anxiety, immunization status, and preventive screenings. We also reviewed your recent weight changes and planned for updated lab work.  YOUR PLAN: HYPERTENSION: Your high blood pressure is being managed with spironolactone  and Entresto . -Continue taking spironolactone  25 mg daily. -Continue taking Entresto  twice daily.  HYPERLIPIDEMIA: Your high cholesterol is being managed with rosuvastatin . -Continue taking rosuvastatin  as prescribed.  CORONARY ARTERY DISEASE, STATUS POST STENT PLACEMENT: Your heart condition is being managed with Plavix , carvedilol , and Jardiance . -Continue taking Plavix  as prescribed. -Continue taking carvedilol  as prescribed. -Continue taking Jardiance  as prescribed.  DEPRESSION AND ANXIETY: You have chronic anxiety and we discussed starting Wellbutrin for its dual action and faster onset. -Start taking Wellbutrin in the morning with food. You have a 30-day supply. -Continue taking Xanax  as prescribed.   GENERAL HEALTH MAINTENANCE: We discussed your immunization status and preventive screenings. -You are due for a tetanus booster, but you declined it at this time. -You declined flu and pneumonia vaccines. -You are scheduled for fasting blood work including cholesterol panel, kidney and liver function tests, A1c, PSA, blood counts, and vitamin D. -Consider PSA screening annually. -Consider Cologuard in three years.  If you have any problems before your next visit feel free to message me via MyChart (minor issues or questions) or call the office, otherwise you may reach out to schedule an office visit.  Thank you! Saddie Sacks, PA-C

## 2024-08-15 NOTE — Assessment & Plan Note (Signed)
 LDL goal of less than 70.  Rechecking lipid panel and CMP with labs.  For now, continue rosuvastatin  40 mg daily.  Continue regular follow-up with cardiology.

## 2024-08-20 ENCOUNTER — Other Ambulatory Visit: Payer: Self-pay | Admitting: Medical

## 2024-08-21 ENCOUNTER — Other Ambulatory Visit

## 2024-08-21 ENCOUNTER — Other Ambulatory Visit: Payer: Self-pay

## 2024-08-21 DIAGNOSIS — Z1321 Encounter for screening for nutritional disorder: Secondary | ICD-10-CM

## 2024-08-21 DIAGNOSIS — Z125 Encounter for screening for malignant neoplasm of prostate: Secondary | ICD-10-CM

## 2024-08-21 DIAGNOSIS — I10 Essential (primary) hypertension: Secondary | ICD-10-CM

## 2024-08-21 DIAGNOSIS — Z1159 Encounter for screening for other viral diseases: Secondary | ICD-10-CM

## 2024-08-21 DIAGNOSIS — E782 Mixed hyperlipidemia: Secondary | ICD-10-CM

## 2024-08-21 MED ORDER — ALPRAZOLAM 0.5 MG PO TABS: 0.5000 mg | ORAL_TABLET | Freq: Three times a day (TID) | ORAL | 0 refills | Status: DC | PRN

## 2024-08-22 LAB — COMPREHENSIVE METABOLIC PANEL WITH GFR
ALT: 31 IU/L (ref 0–44)
AST: 35 IU/L (ref 0–40)
Albumin: 4.6 g/dL (ref 3.9–4.9)
Alkaline Phosphatase: 73 IU/L (ref 47–123)
BUN/Creatinine Ratio: 18 (ref 10–24)
BUN: 19 mg/dL (ref 8–27)
Bilirubin Total: 0.5 mg/dL (ref 0.0–1.2)
CO2: 16 mmol/L — ABNORMAL LOW (ref 20–29)
Calcium: 9.2 mg/dL (ref 8.6–10.2)
Chloride: 103 mmol/L (ref 96–106)
Creatinine, Ser: 1.07 mg/dL (ref 0.76–1.27)
Globulin, Total: 2.2 g/dL (ref 1.5–4.5)
Glucose: 93 mg/dL (ref 70–99)
Potassium: 4.2 mmol/L (ref 3.5–5.2)
Sodium: 137 mmol/L (ref 134–144)
Total Protein: 6.8 g/dL (ref 6.0–8.5)
eGFR: 76 mL/min/1.73 (ref >59)

## 2024-08-22 LAB — CBC WITH DIFFERENTIAL/PLATELET
Basophils Absolute: 0.1 x10E3/uL (ref 0.0–0.2)
Basos: 1 %
EOS (ABSOLUTE): 0.4 x10E3/uL (ref 0.0–0.4)
Eos: 6 %
Hematocrit: 43.3 % (ref 37.5–51.0)
Hemoglobin: 14.5 g/dL (ref 13.0–17.7)
Immature Grans (Abs): 0 x10E3/uL (ref 0.0–0.1)
Immature Granulocytes: 0 %
Lymphocytes Absolute: 1.2 x10E3/uL (ref 0.7–3.1)
Lymphs: 19 %
MCH: 30.7 pg (ref 26.6–33.0)
MCHC: 33.5 g/dL (ref 31.5–35.7)
MCV: 92 fL (ref 79–97)
Monocytes Absolute: 0.6 x10E3/uL (ref 0.1–0.9)
Monocytes: 10 %
Neutrophils Absolute: 4 x10E3/uL (ref 1.4–7.0)
Neutrophils: 64 %
Platelets: 173 x10E3/uL (ref 150–450)
RBC: 4.72 x10E6/uL (ref 4.14–5.80)
RDW: 12.6 % (ref 11.6–15.4)
WBC: 6.2 x10E3/uL (ref 3.4–10.8)

## 2024-08-22 LAB — LIPID PANEL
Chol/HDL Ratio: 2.5 ratio (ref 0.0–5.0)
Cholesterol, Total: 133 mg/dL (ref 100–199)
HDL: 54 mg/dL (ref >39)
LDL Chol Calc (NIH): 56 mg/dL (ref 0–99)
Triglycerides: 134 mg/dL (ref 0–149)
VLDL Cholesterol Cal: 23 mg/dL (ref 5–40)

## 2024-08-22 LAB — HEMOGLOBIN A1C
Est. average glucose Bld gHb Est-mCnc: 123 mg/dL
Hgb A1c MFr Bld: 5.9 % — ABNORMAL HIGH (ref 4.8–5.6)

## 2024-08-22 LAB — VITAMIN D 25 HYDROXY (VIT D DEFICIENCY, FRACTURES): Vit D, 25-Hydroxy: 49.4 ng/mL (ref 30.0–100.0)

## 2024-08-22 LAB — TSH: TSH: 3.01 u[IU]/mL (ref 0.450–4.500)

## 2024-08-22 LAB — PSA: Prostate Specific Ag, Serum: 1.2 ng/mL (ref 0.0–4.0)

## 2024-08-22 LAB — HEPATITIS C ANTIBODY: Hep C Virus Ab: NONREACTIVE

## 2024-08-23 ENCOUNTER — Encounter: Admitting: Cardiology

## 2024-08-23 ENCOUNTER — Ambulatory Visit: Payer: Self-pay

## 2024-09-04 ENCOUNTER — Telehealth: Payer: Self-pay | Admitting: Cardiology

## 2024-09-04 NOTE — Telephone Encounter (Signed)
 Called to confirm/remind patient of their appointment at the [facility] on 09/05/24.   Appointment:   [x] Confirmed  [] Left mess   [] No answer/No voice mail  [] VM Full/unable to leave message  [] Phone not in service  Patient reminded to bring all medications and/or complete list.  Confirmed patient has transportation. Gave directions, instructed to utilize valet parking.

## 2024-09-05 ENCOUNTER — Ambulatory Visit: Attending: Cardiology | Admitting: Cardiology

## 2024-09-05 VITALS — BP 114/74 | HR 69 | Wt 211.4 lb

## 2024-09-05 DIAGNOSIS — Z87891 Personal history of nicotine dependence: Secondary | ICD-10-CM | POA: Diagnosis not present

## 2024-09-05 DIAGNOSIS — I255 Ischemic cardiomyopathy: Secondary | ICD-10-CM | POA: Insufficient documentation

## 2024-09-05 DIAGNOSIS — Z955 Presence of coronary angioplasty implant and graft: Secondary | ICD-10-CM | POA: Diagnosis not present

## 2024-09-05 DIAGNOSIS — Z7902 Long term (current) use of antithrombotics/antiplatelets: Secondary | ICD-10-CM | POA: Diagnosis not present

## 2024-09-05 DIAGNOSIS — I110 Hypertensive heart disease with heart failure: Secondary | ICD-10-CM | POA: Insufficient documentation

## 2024-09-05 DIAGNOSIS — I5022 Chronic systolic (congestive) heart failure: Secondary | ICD-10-CM | POA: Insufficient documentation

## 2024-09-05 DIAGNOSIS — E7841 Elevated Lipoprotein(a): Secondary | ICD-10-CM | POA: Insufficient documentation

## 2024-09-05 DIAGNOSIS — Z79899 Other long term (current) drug therapy: Secondary | ICD-10-CM | POA: Insufficient documentation

## 2024-09-05 DIAGNOSIS — I252 Old myocardial infarction: Secondary | ICD-10-CM | POA: Insufficient documentation

## 2024-09-05 DIAGNOSIS — I2510 Atherosclerotic heart disease of native coronary artery without angina pectoris: Secondary | ICD-10-CM | POA: Diagnosis not present

## 2024-09-05 MED ORDER — CARVEDILOL 6.25 MG PO TABS: ORAL_TABLET | ORAL | Status: DC

## 2024-09-05 NOTE — Patient Instructions (Signed)
 Medication Changes:  INCREASE Carvedilol  to 9.375 mg (1 & 1/2 tabs) Twice daily FOR 10 DAYS, if you tolerate this dose ok then increase to 12.5 mg (2 tabs) Twice daily **IF you tolerate this dose please let us  know and we can send you in an updated//new prescription  Testing/Procedures:  Your physician has requested that you have an echocardiogram. Echocardiography is a painless test that uses sound waves to create images of your heart. It provides your doctor with information about the size and shape of your heart and how well your heart's chambers and valves are working. This procedure takes approximately one hour. There are no restrictions for this procedure. Please do NOT wear cologne, perfume, aftershave, or lotions (deodorant is allowed). Please arrive 15 minutes prior to your appointment time. AS SCHEDULED 11/06/24  Please note: We ask at that you not bring children with you during ultrasound (echo/ vascular) testing. Due to room size and safety concerns, children are not allowed in the ultrasound rooms during exams. Our front office staff cannot provide observation of children in our lobby area while testing is being conducted. An adult accompanying a patient to their appointment will only be allowed in the ultrasound room at the discretion of the ultrasound technician under special circumstances. We apologize for any inconvenience.  Special Instructions // Education:  Do the following things EVERYDAY: Weigh yourself in the morning before breakfast. Write it down and keep it in a log. Take your medicines as prescribed Eat low salt foods--Limit salt (sodium) to 2000 mg per day.  Stay as active as you can everyday Limit all fluids for the day to less than 2 liters   Follow-Up in: February 2026, **Please call our office in January to schedule this appointment    If you have any questions or concerns before your next appointment please send us  a message through [place] or call our  office at [phone], If it is after office hours your call will be answered by our answering service and directed appropriately.     At the [facility], you and your health needs are our priority. We have a designated team specialized in the treatment of Heart Failure. This Care Team includes your primary Heart Failure Specialized Cardiologist (physician), Advanced Practice Providers (APPs- Physician Assistants and Nurse Practitioners), and Pharmacist who all work together to provide you with the care you need, when you need it.   You may see any of the following providers on your designated Care Team at your next follow up:  Dr. Toribio Fuel Dr. Ezra Shuck Dr. Ria Commander Dr. Odis Brownie Greig Mosses, NP Caffie Shed, [place] [address] [place], [place] Beckey Coe, NP Jordan Lee, NP Ellouise Class, NP Jaun Bash, PharmD

## 2024-09-05 NOTE — Progress Notes (Signed)
 PCP: Gayle Saddie FALCON, PA-C Cardiology: Dr. Mady HF Cardiology: Dr. Rolan  Chief complaint: CHF  68 y.o. with history of CAD and ischemic cardiomyopathy was referred by NP Riddle for evaluation of CHF. Patient was admitted in 7/24 with NSTEMI, HS-TnI peaked at 4000.  Cath showed occluded proximal to mid LAD treated with DES.  Echo showed EF 30%.  He had VT runs immediately post-revascularization.  Repeat echo in 9/24 showed EF 30-35%, and cardiac MRI in 3/25 showed LV EF 32% with extensive post-MI scar in LAD distribution.  He was seen by EP, ICD was recommended.  He remains unsure.   Patient returns for followup of CHF.  He is back on Entresto  24/26 bid as he became lightheaded on the higher dose. He has been going to the [facility].  No exertional chest pain or dyspnea.  No lightheadedness.  No orthopnea/PND.  Overall, feeling good. Weight up 3 lbs.   Labs (7/24): LDL 46, Lp(a) 198 Labs (1/25): K 4.2, creatinine 1.03 Labs (4/25): K 4.3, creatinine 1.13, LDL 37 Labs (11/25): LDL 56, K 4.2, creatinine 8.92  PMH: 1. GERD 2. HTN 3. Hyperlipidemia: Elevated Lp(a)  4. CAD: NSTEMI 7/24, cath with occluded prox/mid LAD treated with DES.  5. Chronic systolic CHF: Ischemic cardiomyopathy.  - Echo (7/24): EF 30% - Echo (9/24): EF 30-35%, normal RV - Cardiac MRI (3/25): LV EF 32%, RV EF 34%; mid-apical anterior/anteroseptal subendocardial LGE and extensive apical subendocardial LGE.   SH: Married, retired, lives in [place].  Quit smoking in 2014.  Rare ETOH.   FH: Father with MI at 40  ROS: All systems reviewed and negative except as per HPI.   Current Outpatient Medications  Medication Sig Dispense Refill   ALPRAZolam  (XANAX ) 0.5 MG tablet Take 1 tablet (0.5 mg total) by mouth 3 (three) times daily as needed for anxiety. 60 tablet 0   buPROPion  (WELLBUTRIN  XL) 150 MG 24 hr tablet Take 1 tablet (150 mg total) by mouth daily. 30 tablet 2   clopidogrel  (PLAVIX ) 75 MG tablet Take 1 tablet (75 mg total)  by mouth daily. 90 tablet 3   empagliflozin  (JARDIANCE ) 10 MG TABS tablet TAKE 1 TABLET BY MOUTH EVERY DAY 90 tablet 3   nitroGLYCERIN  (NITROSTAT ) 0.4 MG SL tablet Place 1 tablet (0.4 mg total) under the tongue every 5 (five) minutes x 3 doses as needed for chest pain. 25 tablet 2   rosuvastatin  (CRESTOR ) 40 MG tablet Take 1 tablet (40 mg total) by mouth daily. 90 tablet 3   sacubitril -valsartan  (ENTRESTO ) 24-26 MG Take 1 tablet by mouth 2 (two) times daily. 180 tablet 3   spironolactone  (ALDACTONE ) 25 MG tablet Take 1 tablet (25 mg total) by mouth daily. 90 tablet 3   carvedilol  (COREG ) 6.25 MG tablet Take 1.5 tablets (9.375 mg total) by mouth 2 (two) times daily with a meal for 10 days, THEN 2 tablets (12.5 mg total) 2 (two) times daily with a meal.     No current facility-administered medications for this visit.   BP 114/74   Pulse 69   Wt 211 lb 6 oz (95.9 kg)   SpO2 97%   BMI 27.89 kg/m  General: NAD Neck: No JVD, no thyromegaly or thyroid nodule.  Lungs: Clear to auscultation bilaterally with normal respiratory effort. CV: Nondisplaced PMI.  Heart regular S1/S2, no S3/S4, no murmur.  No peripheral edema.  No carotid bruit.  Normal pedal pulses.  Abdomen: Soft, nontender, no hepatosplenomegaly, no distention.  Skin: Intact without  lesions or rashes.  Neurologic: Alert and oriented x 3.  Psych: Normal affect. Extremities: No clubbing or cyanosis.  HEENT: Normal.   1. Chronic systolic CHF: Ischemic cardiomyopathy.  Persistent LV dysfunction post- MI in 7/24.  Cardiac MRI in 3/25 showed LV EF 32%, RV EF 34%; mid-apical anterior/anteroseptal subendocardial LGE and extensive apical subendocardial LGE. NYHA class I, looks euvolemic.  - Increase Coreg  to 9.375 mg bid x 10 days.  If BP remains stable, increase to 12.5 mg bid after that.  - Continue Jardiance  10 mg daily.  - Continue spironolactone  25 mg daily. Recent BMET stable.  - Continue Entresto  24/26 bid.  BP too low when he tried  to increase this.  - We again discussed ICD.  He has persistently low EF post-MI with significant scarring on cardiac MRI.  I do think that his risk for malignant arrhythmia over time is significant.  I have recommended that he get an ICD.  Not CRT candidate with narrow QRS. He is not ready to get an ICD yet.  Will repeat echo in 1/26 and readdress ICD after that.   2. CAD: NSTEMI 7/24 with occluded prox/mid LAD treated with DES. He has a strong FH of premature CAD as well as elevated Lp(a).  No chest pain.  Completed cardiac rehab.  - Continue clopidogrel  75 mg daily long-term.  - Continue Crestor  40 mg daily.   3. Hyperlipidemia: LDL 56 in 11/25 but noted very high Lp(a).  - Continue Crestor .   - He was seen in lipid clinic due to elevated Lp(a), plan will be to get him on an anti-Lp(a) drug when it is commercially available.  He cannot get coverage right now for Repatha.    Followup after echo in 1/26.    I spent 31 minutes reviewing records, interviewing/examining patient, and managing orders.   Ezra Shuck 09/05/2024

## 2024-09-11 ENCOUNTER — Other Ambulatory Visit: Payer: Self-pay

## 2024-09-19 ENCOUNTER — Telehealth: Payer: Self-pay | Admitting: Cardiology

## 2024-09-19 MED ORDER — CARVEDILOL 12.5 MG PO TABS: 12.5000 mg | ORAL_TABLET | Freq: Two times a day (BID) | ORAL | 1 refills | Status: AC

## 2024-09-19 NOTE — Telephone Encounter (Signed)
 Refilled the medication based on up titration per MD note

## 2024-09-25 ENCOUNTER — Ambulatory Visit: Payer: Self-pay

## 2024-09-25 ENCOUNTER — Encounter: Payer: Self-pay | Admitting: Family Medicine

## 2024-09-25 ENCOUNTER — Ambulatory Visit: Admitting: Family Medicine

## 2024-09-25 VITALS — BP 135/72 | HR 79 | Ht 73.0 in | Wt 203.0 lb

## 2024-09-25 DIAGNOSIS — J988 Other specified respiratory disorders: Secondary | ICD-10-CM | POA: Insufficient documentation

## 2024-09-25 MED ORDER — PROMETHAZINE-CODEINE 6.25-10 MG/5ML PO SOLN: 5.0000 mL | Freq: Four times a day (QID) | ORAL | 1 refills | Status: AC | PRN

## 2024-09-25 MED ORDER — CEFDINIR 300 MG PO CAPS: 300.0000 mg | ORAL_CAPSULE | Freq: Two times a day (BID) | ORAL | 0 refills | Status: AC

## 2024-09-25 MED ORDER — AZITHROMYCIN 250 MG PO TABS: ORAL_TABLET | ORAL | 0 refills | Status: AC

## 2024-09-25 NOTE — Telephone Encounter (Signed)
 FYI Only or Action Required?: FYI only for provider: appointment scheduled on today.  Patient was last seen in primary care on 08/15/2024 by Gayle Saddie FALCON, PA-C.  Called Nurse Triage reporting Cough.  Symptoms began several days ago.  Interventions attempted: Nothing.  Symptoms are: unchanged.  Triage Disposition: See Physician Within 24 Hours  Patient/caregiver understands and will follow disposition?: Yes, will follow disposition   Reason for Triage: The patient is concerned that they may be experiencing RSV. The patient shares that they are experiencing cough, difficulty resting and and an elevated temperature. Please contact the patient further when possible     Reason for Disposition  SEVERE coughing spells (e.g., whooping sound after coughing, vomiting after coughing)  Answer Assessment - Initial Assessment Questions 1. ONSET: When did the cough begin?      About 3 days 2. SEVERITY: How bad is the cough today?      Terrible 3. SPUTUM: Describe the color of your sputum (e.g., none, dry cough; clear, white, yellow, green)     Cannot get any sputum up 4. HEMOPTYSIS: Are you coughing up any blood? If Yes, ask: How much? (e.g., flecks, streaks, tablespoons, etc.)     denies 5. DIFFICULTY BREATHING: Are you having difficulty breathing? If Yes, ask: How bad is it? (e.g., mild, moderate, severe)      denies 6. FEVER: Do you have a fever? If Yes, ask: What is your temperature, how was it measured, and when did it start?     100.4 7. CARDIAC HISTORY: Do you have any history of heart disease? (e.g., heart attack, congestive heart failure)      MI 8. LUNG HISTORY: Do you have any history of lung disease?  (e.g., pulmonary embolus, asthma, emphysema)     denies 9. PE RISK FACTORS: Do you have a history of blood clots? (or: recent major surgery, recent prolonged travel, bedridden)     denies 10. OTHER SYMPTOMS: Do you have any other symptoms? (e.g., runny  nose, wheezing, chest pain)       Sometimes runny nose, chest soreness with coughing,  12. TRAVEL: Have you traveled out of the country in the last month? (e.g., travel history, exposures)       denies  Protocols used: Cough - Acute Productive-A-AH

## 2024-09-25 NOTE — Patient Instructions (Signed)
 It was nice to see you today,  We addressed the following topics today: - Please take the prescribed antibiotics for 5 days as directed. - You can take Tylenol  up to 1000 mg every 8 hours for fever and pain. Do not take more than 4000 mg in a 24-hour period. Please avoid ibuprofen. - If you have a pulse oximeter at home, please monitor your oxygen levels. If your oxygen level drops below 90%, please go to the emergency department. - Please follow up with us  if your symptoms are not getting better after you finish the antibiotics.  Have a great day,  Rolan Slain, MD

## 2024-09-25 NOTE — Assessment & Plan Note (Signed)
 Presents with a productive cough, fever, and body aches, consistent with a viral or bacterial upper respiratory infection. He has a history of bronchitis. He is currently taking Tylenol  and Robitussin with minimal relief. - Plan: - Prescribed a 5-day course of a cephalosporin antibiotic. - Prescribed a 5-day course of azithromycin  (Z-Pak). - Prescribed codeine  cough syrup for cough suppression. - Advised to continue Tylenol  for fever and pain, up to 1000 mg every 8 hours, not to exceed 4000 mg per day. Advised to avoid ibuprofen due to Plavix  use. - Advised to monitor oxygen saturation with a pulse oximeter if available. Instructed to go to the emergency department if oxygen levels drop below 90%. - Advised to follow up if symptoms do not improve after completing the course of antibiotics.

## 2024-09-25 NOTE — Progress Notes (Unsigned)
 Acute Office Visit  Subjective:     Patient ID: Connor Haas., male    DOB: May 07, 1956, 68 y.o.   MRN: [id number]  Chief Complaint  Patient presents with   Cough    HPI Patient is in today for   Subjective - Presents with a cough that started on Friday afternoon. He suspects a possible RSV exposure after visiting a physical rehab center where an outbreak was reported. - The cough is productive, but it is difficult to expectorate. The sputum is clear when it is produced. - He reports a headache and some diarrhea. - He has been experiencing body aches. - The cough is worse at night when lying down, causing significant sleep disruption. He has been sleeping in a recliner or on the couch propped up to alleviate symptoms. - He has been monitoring his temperature at home, with readings ranging from 99.73F to 100.45F. - He has been taking Tylenol  for fever and aches, Robitussin for the cough, and vitamin C and zinc. He reports Robitussin was initially helpful but is no longer effective.  Medications - Plavix . - Xanax , reports taking for 30 years and has developed a tolerance. He requests a refill for the 1 mg dose, as the last prescription was for 0.5 mg. - Tylenol  (Equate extra strength) for fever and pain. - Robitussin for cough. - Vitamin C and zinc. - Reports a past reaction to penicillin, which caused dizziness and sweating, but no rash or breathing difficulty. He has taken hydrocodone in the past without issue. He has also taken codeine  cough syrup in the past, which he found very effective for his cough and for sleep.  PMH/PSH/FH/Social Hx - Past medical history of bronchitis, which did not progress to pneumonia. - History of back problems. - Allergy to penicillin (dizziness, sweating). No known allergy to oxycodone , but it caused a rash. - Social history: Retired last year. Previously attended a clinic near his work, but has switched to this practice due to convenience and  issues with staff turnover at the previous clinic.  ROS - Constitutional: Reports fever and chills. Denies significant weight loss. - HEENT: Reports headache and stuffy nose. - Respiratory: Reports cough. Denies shortness of breath. - GI: Reports diarrhea. - Musculoskeletal: Reports body aches. - Integumentary: Denies rash. - Neurological: Reports headache. Denies dizziness.    ROS      Objective:    BP 135/72   Pulse 79   Ht 6' 1 (1.854 m)   Wt 203 lb (92.1 kg)   SpO2 95%   BMI 26.78 kg/m    Physical Exam Gen: alert, oriented Pulm: cough, mildly increased wob.  Ronchorous b/l. Psych: pleasant affect  No results found for any visits on 09/25/24.      Assessment & Plan:   Respiratory infection Assessment & Plan: Presents with a productive cough, fever, and body aches, consistent with a viral or bacterial upper respiratory infection. He has a history of bronchitis. He is currently taking Tylenol  and Robitussin with minimal relief. - Plan: - Prescribed a 5-day course of a cephalosporin antibiotic. - Prescribed a 5-day course of azithromycin  (Z-Pak). - Prescribed codeine  cough syrup for cough suppression. - Advised to continue Tylenol  for fever and pain, up to 1000 mg every 8 hours, not to exceed 4000 mg per day. Advised to avoid ibuprofen due to Plavix  use. - Advised to monitor oxygen saturation with a pulse oximeter if available. Instructed to go to the emergency department if oxygen levels drop  below 90%. - Advised to follow up if symptoms do not improve after completing the course of antibiotics.   Other orders -     Cefdinir ; Take 1 capsule (300 mg total) by mouth 2 (two) times daily for 5 days.  Dispense: 10 capsule; Refill: 0 -     Azithromycin ; Take 2 tablets on day 1, then 1 tablet daily on days 2 through 5  Dispense: 6 tablet; Refill: 0 -     Promethazine -Codeine ; Take 5 mLs by mouth every 6 (six) hours as needed.  Dispense: 118 mL; Refill:  1     Return if symptoms worsen or fail to improve.  Toribio MARLA Slain, MD

## 2024-09-26 ENCOUNTER — Other Ambulatory Visit (HOSPITAL_COMMUNITY): Payer: Self-pay

## 2024-09-26 ENCOUNTER — Other Ambulatory Visit: Payer: Self-pay | Admitting: Cardiology

## 2024-09-26 ENCOUNTER — Other Ambulatory Visit: Payer: Self-pay

## 2024-09-26 DIAGNOSIS — F411 Generalized anxiety disorder: Secondary | ICD-10-CM

## 2024-09-26 MED ORDER — ALPRAZOLAM 1 MG PO TABS: ORAL_TABLET | ORAL | 0 refills | Status: DC

## 2024-09-26 MED ORDER — SACUBITRIL-VALSARTAN 24-26 MG PO TABS: 1.0000 | ORAL_TABLET | Freq: Two times a day (BID) | ORAL | 3 refills | Status: AC

## 2024-10-06 ENCOUNTER — Other Ambulatory Visit: Payer: Self-pay | Admitting: Medical

## 2024-10-31 ENCOUNTER — Other Ambulatory Visit: Payer: Self-pay

## 2024-10-31 DIAGNOSIS — F411 Generalized anxiety disorder: Secondary | ICD-10-CM

## 2024-11-06 ENCOUNTER — Ambulatory Visit

## 2024-11-10 ENCOUNTER — Other Ambulatory Visit: Payer: Self-pay | Admitting: Medical

## 2024-11-15 ENCOUNTER — Ambulatory Visit

## 2024-11-16 ENCOUNTER — Ambulatory Visit

## 2024-11-21 ENCOUNTER — Ambulatory Visit

## 2024-11-22 ENCOUNTER — Ambulatory Visit
